# Patient Record
Sex: Female | Born: 1970 | Race: White | Hispanic: No | Marital: Married | Smoking: Never smoker
Health system: Southern US, Community
[De-identification: ages and names within clinical notes are randomized; demographics above are authoritative.]

## PROBLEM LIST (undated history)

## (undated) DIAGNOSIS — I10 Essential (primary) hypertension: Secondary | ICD-10-CM

## (undated) DIAGNOSIS — E119 Type 2 diabetes mellitus without complications: Secondary | ICD-10-CM

## (undated) DIAGNOSIS — E785 Hyperlipidemia, unspecified: Secondary | ICD-10-CM

## (undated) HISTORY — DX: Type 2 diabetes mellitus without complications: E11.9

## (undated) HISTORY — PX: ABDOMINAL HYSTERECTOMY: SHX81

## (undated) HISTORY — DX: Hyperlipidemia, unspecified: E78.5

## (undated) HISTORY — PX: CARDIAC ELECTROPHYSIOLOGY STUDY AND ABLATION: SHX1294

## (undated) HISTORY — PX: OTHER SURGICAL HISTORY: SHX169

## (undated) HISTORY — DX: Essential (primary) hypertension: I10

## (undated) HISTORY — PX: TOTAL ABDOMINAL HYSTERECTOMY: SHX209

---

## 2003-09-26 ENCOUNTER — Other Ambulatory Visit: Payer: Self-pay

## 2004-12-24 ENCOUNTER — Ambulatory Visit: Payer: Self-pay | Admitting: Family Medicine

## 2007-03-08 ENCOUNTER — Other Ambulatory Visit: Payer: Self-pay

## 2007-03-08 ENCOUNTER — Emergency Department: Payer: Self-pay | Admitting: Emergency Medicine

## 2007-05-09 ENCOUNTER — Other Ambulatory Visit: Payer: Self-pay

## 2007-05-09 ENCOUNTER — Emergency Department: Payer: Self-pay | Admitting: Emergency Medicine

## 2007-08-09 ENCOUNTER — Emergency Department: Payer: Self-pay | Admitting: Emergency Medicine

## 2009-05-30 ENCOUNTER — Emergency Department: Payer: Self-pay | Admitting: Emergency Medicine

## 2009-12-06 ENCOUNTER — Emergency Department: Payer: Self-pay | Admitting: Emergency Medicine

## 2012-06-01 ENCOUNTER — Ambulatory Visit: Payer: Self-pay

## 2014-09-11 ENCOUNTER — Other Ambulatory Visit: Payer: Self-pay | Admitting: Unknown Physician Specialty

## 2014-09-12 NOTE — Telephone Encounter (Signed)
Needs an appointment in August

## 2014-09-13 NOTE — Telephone Encounter (Signed)
Called and left patient a voicemail to call back and schedule a follow-up visit.

## 2014-09-14 NOTE — Telephone Encounter (Signed)
Called and left patient a voicemail to return my call and schedule a follow-up visit.

## 2014-09-14 NOTE — Telephone Encounter (Signed)
Patient returned call and was scheduled a follow-up visit in August.

## 2014-10-10 ENCOUNTER — Ambulatory Visit: Payer: Self-pay | Admitting: Unknown Physician Specialty

## 2014-10-24 ENCOUNTER — Encounter: Payer: Self-pay | Admitting: Unknown Physician Specialty

## 2014-10-24 ENCOUNTER — Ambulatory Visit (INDEPENDENT_AMBULATORY_CARE_PROVIDER_SITE_OTHER): Payer: Managed Care, Other (non HMO) | Admitting: Unknown Physician Specialty

## 2014-10-24 VITALS — BP 116/79 | HR 96 | Temp 99.1°F | Ht 63.1 in | Wt 231.2 lb

## 2014-10-24 DIAGNOSIS — E1122 Type 2 diabetes mellitus with diabetic chronic kidney disease: Secondary | ICD-10-CM

## 2014-10-24 DIAGNOSIS — N189 Chronic kidney disease, unspecified: Secondary | ICD-10-CM

## 2014-10-24 DIAGNOSIS — N182 Chronic kidney disease, stage 2 (mild): Secondary | ICD-10-CM | POA: Diagnosis not present

## 2014-10-24 DIAGNOSIS — R1011 Right upper quadrant pain: Secondary | ICD-10-CM | POA: Diagnosis not present

## 2014-10-24 DIAGNOSIS — N183 Chronic kidney disease, stage 3 (moderate): Secondary | ICD-10-CM | POA: Diagnosis not present

## 2014-10-24 DIAGNOSIS — E785 Hyperlipidemia, unspecified: Secondary | ICD-10-CM | POA: Insufficient documentation

## 2014-10-24 DIAGNOSIS — E119 Type 2 diabetes mellitus without complications: Secondary | ICD-10-CM | POA: Insufficient documentation

## 2014-10-24 DIAGNOSIS — N184 Chronic kidney disease, stage 4 (severe): Secondary | ICD-10-CM

## 2014-10-24 DIAGNOSIS — E1169 Type 2 diabetes mellitus with other specified complication: Secondary | ICD-10-CM | POA: Insufficient documentation

## 2014-10-24 DIAGNOSIS — N181 Chronic kidney disease, stage 1: Secondary | ICD-10-CM | POA: Diagnosis not present

## 2014-10-24 DIAGNOSIS — F32A Depression, unspecified: Secondary | ICD-10-CM | POA: Insufficient documentation

## 2014-10-24 DIAGNOSIS — I129 Hypertensive chronic kidney disease with stage 1 through stage 4 chronic kidney disease, or unspecified chronic kidney disease: Secondary | ICD-10-CM | POA: Insufficient documentation

## 2014-10-24 DIAGNOSIS — N185 Chronic kidney disease, stage 5: Secondary | ICD-10-CM

## 2014-10-24 DIAGNOSIS — F329 Major depressive disorder, single episode, unspecified: Secondary | ICD-10-CM

## 2014-10-24 DIAGNOSIS — G2581 Restless legs syndrome: Secondary | ICD-10-CM

## 2014-10-24 DIAGNOSIS — I8390 Asymptomatic varicose veins of unspecified lower extremity: Secondary | ICD-10-CM | POA: Insufficient documentation

## 2014-10-24 LAB — UA/M W/RFLX CULTURE, ROUTINE
BILIRUBIN UA: NEGATIVE
KETONES UA: NEGATIVE
Leukocytes, UA: NEGATIVE
Nitrite, UA: NEGATIVE
Protein, UA: NEGATIVE
RBC, UA: NEGATIVE
SPEC GRAV UA: 1.02 (ref 1.005–1.030)
UUROB: 0.2 mg/dL (ref 0.2–1.0)
pH, UA: 6 (ref 5.0–7.5)

## 2014-10-24 LAB — BAYER DCA HB A1C WAIVED: HB A1C: 7.7 % — AB (ref <7.0)

## 2014-10-24 MED ORDER — ATORVASTATIN CALCIUM 20 MG PO TABS: 20.0000 mg | ORAL_TABLET | Freq: Every day | ORAL | Status: DC

## 2014-10-24 MED ORDER — CANAGLIFLOZIN 100 MG PO TABS: 100.0000 mg | ORAL_TABLET | Freq: Every day | ORAL | Status: DC

## 2014-10-24 MED ORDER — METFORMIN HCL 500 MG PO TABS: 1000.0000 mg | ORAL_TABLET | Freq: Two times a day (BID) | ORAL | Status: DC

## 2014-10-24 MED ORDER — AMLODIPINE BESYLATE 5 MG PO TABS: 5.0000 mg | ORAL_TABLET | Freq: Every day | ORAL | Status: DC

## 2014-10-24 MED ORDER — LOSARTAN POTASSIUM-HCTZ 100-12.5 MG PO TABS: 1.0000 | ORAL_TABLET | Freq: Every day | ORAL | Status: DC

## 2014-10-24 MED ORDER — FLUOXETINE HCL 20 MG PO CAPS: 20.0000 mg | ORAL_CAPSULE | Freq: Every day | ORAL | Status: DC

## 2014-10-24 MED ORDER — ATENOLOL-CHLORTHALIDONE 100-25 MG PO TABS: 1.0000 | ORAL_TABLET | Freq: Every day | ORAL | Status: DC

## 2014-10-24 NOTE — Assessment & Plan Note (Signed)
Hgb A1C is 7.7.  Start Invokanna that she didn't take before.  Let me know if she needs a new prescription

## 2014-10-24 NOTE — Progress Notes (Signed)
BP 116/79 mmHg  Pulse 96  Temp(Src) 99.1 F (37.3 C)  Ht 5' 3.1" (1.603 m)  Wt 231 lb 3.2 oz (104.872 kg)  BMI 40.81 kg/m2  SpO2 97%  LMP  (LMP Unknown)   Subjective:    Patient ID: Holly Norman, female    DOB: Jun 24, 1970, 44 y.o.   MRN: [id number]  HPI: Holly Norman is a 44 y.o. female  Chief Complaint  Patient presents with  . Diabetes  . Hyperlipidemia  . Hypertension    Relevant past medical, surgical, family and social history reviewed and updated as indicated. Interim medical history since our last visit reviewed. Allergies and medications reviewed and updated.  Diabetes She presents for her follow-up (I put her on Invokanna but she didn't take it as the pharmacist had her scared.  ) diabetic visit. She has type 2 diabetes mellitus. No MedicAlert identification noted. Her disease course has been stable. Pertinent negatives for hypoglycemia include no headaches. Associated symptoms include fatigue. Pertinent negatives for diabetes include no blurred vision, no chest pain, no foot paresthesias, no polydipsia and no weakness. There are no hypoglycemic complications. Symptoms are stable. There are no diabetic complications. She is compliant with treatment all of the time. Her weight is stable. She is following a generally healthy diet. She participates in exercise every other day. Eye exam is current.  Hyperlipidemia This is a chronic problem. The problem is controlled. Pertinent negatives include no chest pain. Current antihyperlipidemic treatment includes statins. There are no compliance problems.   Hypertension This is a chronic problem. The problem is controlled. Associated symptoms include malaise/fatigue. Pertinent negatives include no blurred vision, chest pain or headaches. There are no compliance problems.    Back pain Describes right sided mid back pain that comes and goes.  Sometimes radiates to the front.  Feels like a pressure.  Concerned as it feels like it's  over her kidney and has a history of a kidney infection.    Review of Systems  Constitutional: Positive for malaise/fatigue and fatigue.  Eyes: Negative for blurred vision.  Cardiovascular: Negative for chest pain.  Endocrine: Negative for polydipsia.  Neurological: Negative for weakness and headaches.    Per HPI unless specifically indicated above     Objective:    BP 116/79 mmHg  Pulse 96  Temp(Src) 99.1 F (37.3 C)  Ht 5' 3.1" (1.603 m)  Wt 231 lb 3.2 oz (104.872 kg)  BMI 40.81 kg/m2  SpO2 97%  LMP  (LMP Unknown)  Wt Readings from Last 3 Encounters:  10/24/14 231 lb 3.2 oz (104.872 kg)  07/17/14 231 lb (104.781 kg)    Physical Exam  Constitutional: She is oriented to person, place, and time. She appears well-developed and well-nourished. No distress.  HENT:  Head: Normocephalic and atraumatic.  Eyes: Conjunctivae and lids are normal. Right eye exhibits no discharge. Left eye exhibits no discharge. No scleral icterus.  Cardiovascular: Normal rate, regular rhythm and normal heart sounds.   Pulmonary/Chest: Effort normal. No respiratory distress.  Abdominal: Soft. Normal appearance and bowel sounds are normal. She exhibits no distension. There is tenderness in the right upper quadrant. There is CVA tenderness. There is no rebound, no guarding and negative Murphy's sign.  Musculoskeletal: Normal range of motion.  Neurological: She is alert and oriented to person, place, and time.  Skin: Skin is intact. No rash noted. No pallor.  Psychiatric: She has a normal mood and affect. Her behavior is normal. Judgment and thought content normal.  No results found for this or any previous visit.    Assessment & Plan:   Problem List Items Addressed This Visit      Unprioritized   Hypertensive CKD (chronic kidney disease)    Stable.  Continue present medication      Relevant Orders   Comprehensive metabolic panel   Diabetes    Hgb A1C is 7.7.  Start Invokanna that she  didn't take before.  Let me know if she needs a new prescription      Relevant Medications   atenolol-chlorthalidone (TENORETIC) 100-25 MG per tablet   atorvastatin (LIPITOR) 20 MG tablet   losartan-hydrochlorothiazide (HYZAAR) 100-12.5 MG per tablet   metFORMIN (GLUCOPHAGE) 500 MG tablet   canagliflozin (INVOKANA) 100 MG TABS tablet   Other Relevant Orders   Bayer DCA Hb A1c Waived   Comprehensive metabolic panel   CKD (chronic kidney disease) - Primary   Relevant Orders   Comprehensive metabolic panel   UA/M w/rflx Culture, Routine    Other Visit Diagnoses    Abdominal pain, RUQ        Suspect gallbladder.  Check Amylase/Lipase.  Ordered abdominal US    Relevant Orders    Lipase    Amylase    US Abdomen Complete        Follow up plan: Return in about 3 months (around 01/24/2015).

## 2014-10-24 NOTE — Assessment & Plan Note (Signed)
Stable.  Continue present medication 

## 2014-10-25 ENCOUNTER — Encounter: Payer: Self-pay | Admitting: Unknown Physician Specialty

## 2014-10-25 LAB — COMPREHENSIVE METABOLIC PANEL
A/G RATIO: 1.9 (ref 1.1–2.5)
ALK PHOS: 70 IU/L (ref 39–117)
ALT: 31 IU/L (ref 0–32)
AST: 31 IU/L (ref 0–40)
Albumin: 4.3 g/dL (ref 3.5–5.5)
BILIRUBIN TOTAL: 0.8 mg/dL (ref 0.0–1.2)
BUN/Creatinine Ratio: 20 (ref 9–23)
BUN: 16 mg/dL (ref 6–24)
CHLORIDE: 96 mmol/L — AB (ref 97–108)
CO2: 26 mmol/L (ref 18–29)
Calcium: 9.8 mg/dL (ref 8.7–10.2)
Creatinine, Ser: 0.8 mg/dL (ref 0.57–1.00)
GFR calc non Af Amer: 90 mL/min/{1.73_m2} (ref >59)
GFR, EST AFRICAN AMERICAN: 104 mL/min/{1.73_m2} (ref >59)
Globulin, Total: 2.3 g/dL (ref 1.5–4.5)
Glucose: 230 mg/dL — ABNORMAL HIGH (ref 65–99)
POTASSIUM: 4.4 mmol/L (ref 3.5–5.2)
Sodium: 137 mmol/L (ref 134–144)
Total Protein: 6.6 g/dL (ref 6.0–8.5)

## 2014-10-25 LAB — AMYLASE: AMYLASE: 33 U/L (ref 31–124)

## 2014-10-25 LAB — LIPASE: Lipase: 58 U/L (ref 0–59)

## 2014-10-31 ENCOUNTER — Encounter: Payer: Self-pay | Admitting: Unknown Physician Specialty

## 2014-10-31 ENCOUNTER — Ambulatory Visit
Admission: RE | Admit: 2014-10-31 | Discharge: 2014-10-31 | Disposition: A | Discharge status: Home / Self Care | Payer: Managed Care, Other (non HMO) | Source: Ambulatory Visit | Attending: Unknown Physician Specialty | Admitting: Unknown Physician Specialty

## 2014-10-31 DIAGNOSIS — R1011 Right upper quadrant pain: Secondary | ICD-10-CM | POA: Insufficient documentation

## 2015-01-24 ENCOUNTER — Ambulatory Visit: Payer: Managed Care, Other (non HMO) | Admitting: Unknown Physician Specialty

## 2015-05-18 ENCOUNTER — Telehealth: Payer: Self-pay | Admitting: Unknown Physician Specialty

## 2015-05-18 MED ORDER — OSELTAMIVIR PHOSPHATE 75 MG PO CAPS: 75.0000 mg | ORAL_CAPSULE | Freq: Two times a day (BID) | ORAL | Status: DC

## 2015-05-18 NOTE — Telephone Encounter (Signed)
Routing to provider  

## 2015-05-18 NOTE — Telephone Encounter (Signed)
Voice mail box is full, unable to leave message.

## 2015-05-18 NOTE — Telephone Encounter (Signed)
Pt called stated her husband has the flu and she thinks she has gotten now. Wants to know if tamiflu can be sent to the pharmacy for her. Pharm is [facility] [address] in [place]. Thanks.  Please call pt to confirm medication is called in.

## 2015-05-18 NOTE — Telephone Encounter (Signed)
Sent in

## 2015-05-20 ENCOUNTER — Other Ambulatory Visit: Payer: Self-pay | Admitting: Unknown Physician Specialty

## 2015-05-20 NOTE — Telephone Encounter (Signed)
Needs check

## 2015-05-21 NOTE — Telephone Encounter (Signed)
Called and left patient a voicemail asking for her to please return my call.  

## 2015-05-23 NOTE — Telephone Encounter (Signed)
Called and left patient a voicemail asking for her to please return my call.  

## 2015-05-24 ENCOUNTER — Telehealth: Payer: Self-pay

## 2015-05-24 NOTE — Telephone Encounter (Signed)
Called and left patient a voicemail asking for her to please return my call. This is the 3rd attempt at reaching the patient. Will send a letter.

## 2015-05-24 NOTE — Telephone Encounter (Signed)
Patient returned call from previously closed encounter. She did get the flu prescription we sent in for her and she also scheduled a f/u appointment for 06/05/15.

## 2015-05-24 NOTE — Telephone Encounter (Signed)
Called and left patient a voicemail asking for her to please return my call. This was the 4th attempt at reaching the patient so I will send a letter.

## 2015-06-05 ENCOUNTER — Ambulatory Visit: Payer: Self-pay | Admitting: Unknown Physician Specialty

## 2015-06-11 ENCOUNTER — Ambulatory Visit (INDEPENDENT_AMBULATORY_CARE_PROVIDER_SITE_OTHER): Payer: Managed Care, Other (non HMO) | Admitting: Unknown Physician Specialty

## 2015-06-11 ENCOUNTER — Encounter: Payer: Self-pay | Admitting: Unknown Physician Specialty

## 2015-06-11 VITALS — BP 101/71 | HR 91 | Temp 98.3°F | Ht 62.1 in | Wt 226.4 lb

## 2015-06-11 DIAGNOSIS — R5382 Chronic fatigue, unspecified: Secondary | ICD-10-CM

## 2015-06-11 DIAGNOSIS — I10 Essential (primary) hypertension: Secondary | ICD-10-CM | POA: Diagnosis not present

## 2015-06-11 DIAGNOSIS — E785 Hyperlipidemia, unspecified: Secondary | ICD-10-CM

## 2015-06-11 DIAGNOSIS — I129 Hypertensive chronic kidney disease with stage 1 through stage 4 chronic kidney disease, or unspecified chronic kidney disease: Secondary | ICD-10-CM | POA: Diagnosis not present

## 2015-06-11 DIAGNOSIS — Z0000 Encounter for general adult medical examination without abnormal findings: Secondary | ICD-10-CM

## 2015-06-11 DIAGNOSIS — E1122 Type 2 diabetes mellitus with diabetic chronic kidney disease: Secondary | ICD-10-CM | POA: Diagnosis not present

## 2015-06-11 LAB — LIPID PANEL PICCOLO, WAIVED

## 2015-06-11 MED ORDER — CANAGLIFLOZIN 300 MG PO TABS: 300.0000 mg | ORAL_TABLET | Freq: Every day | ORAL | Status: DC

## 2015-06-11 MED ORDER — LOSARTAN POTASSIUM 50 MG PO TABS
50.0000 mg | ORAL_TABLET | Freq: Every day | ORAL | Status: DC
Start: 2015-06-11 — End: 2015-12-17

## 2015-06-11 NOTE — Assessment & Plan Note (Signed)
Sample lipemic.  Send to lab

## 2015-06-11 NOTE — Assessment & Plan Note (Signed)
A little hypotensive.  Change Hyzaar 100/12.5 to Cozaar 50 mg

## 2015-06-11 NOTE — Assessment & Plan Note (Signed)
Hgb A1C is 7.7.  She feels like she can do better on diet and exercise.  Will increase Invokanna to 300 mg.

## 2015-06-11 NOTE — Progress Notes (Signed)
BP 101/71 mmHg  Pulse 91  Temp(Src) 98.3 F (36.8 C)  Ht 5' 2.1" (1.577 m)  Wt 226 lb 6.4 oz (102.694 kg)  BMI 41.29 kg/m2  SpO2 99%  LMP  (LMP Unknown)   Subjective:    Patient ID: Holly Norman, female    DOB: 1970-09-11, 45 y.o.   MRN: [id number]  HPI: Holly Norman is a 45 y.o. female  Chief Complaint  Patient presents with  . Diabetes  . Hyperlipidemia  . Hypertension   Diabetes:  Using medications without difficulties No hypoglycemic episodes No hyperglycemic episodes Feet problems: none Blood Sugars averaging: Not checking  Hypertension:  Using medications without difficulty Average home BPs 90/50 while at work  Using medication without problems or lightheadedness No chest pain with exertion or shortness of breath No Edema  Elevated Cholesterol: Using medications without problems: No Muscle aches:  Diet compliance: "pretty good" Exercise: walking some  States she is "tired" all the time.  Does admit to having a lot of things going on in her life      Relevant past medical, surgical, family and social history reviewed and updated as indicated. Interim medical history since our last visit reviewed. Allergies and medications reviewed and updated.  Review of Systems  Constitutional: Positive for fatigue.  HENT: Negative.   Eyes: Negative.   Respiratory: Negative.   Cardiovascular: Negative.   Gastrointestinal: Negative.   Endocrine: Negative.   Genitourinary: Negative.   Musculoskeletal: Negative.   Skin: Negative.   Allergic/Immunologic: Negative.   Neurological: Negative.   Hematological: Negative.   Psychiatric/Behavioral: Negative.     Per HPI unless specifically indicated above     Objective:    BP 101/71 mmHg  Pulse 91  Temp(Src) 98.3 F (36.8 C)  Ht 5' 2.1" (1.577 m)  Wt 226 lb 6.4 oz (102.694 kg)  BMI 41.29 kg/m2  SpO2 99%  LMP  (LMP Unknown)  Wt Readings from Last 3 Encounters:  06/11/15 226 lb 6.4 oz (102.694 kg)   10/24/14 231 lb 3.2 oz (104.872 kg)  07/17/14 231 lb (104.781 kg)    Physical Exam  Constitutional: She is oriented to person, place, and time. She appears well-developed and well-nourished. No distress.  HENT:  Head: Normocephalic and atraumatic.  Eyes: Conjunctivae and lids are normal. Right eye exhibits no discharge. Left eye exhibits no discharge. No scleral icterus.  Neck: Normal range of motion. Neck supple. No JVD present. Carotid bruit is not present.  Cardiovascular: Normal rate, regular rhythm and normal heart sounds.   Pulmonary/Chest: Effort normal and breath sounds normal.  Abdominal: Normal appearance. There is no splenomegaly or hepatomegaly.  Musculoskeletal: Normal range of motion.  Neurological: She is alert and oriented to person, place, and time.  Skin: Skin is warm, dry and intact. No rash noted. No pallor.  Psychiatric: She has a normal mood and affect. Her behavior is normal. Judgment and thought content normal.    Results for orders placed or performed in visit on 10/24/14  Bayer DCA Hb A1c Waived  Result Value Ref Range   Bayer DCA Hb A1c Waived 7.7 (H) <7.0 %  Comprehensive metabolic panel  Result Value Ref Range   Glucose 230 (H) 65 - 99 mg/dL   BUN 16 6 - 24 mg/dL   Creatinine, Ser 0.80 0.57 - 1.00 mg/dL   GFR calc non Af Amer 90 >59 mL/min/1.73   GFR calc Af Amer 104 >59 mL/min/1.73   BUN/Creatinine Ratio 20 9 - 23  Sodium 137 134 - 144 mmol/L   Potassium 4.4 3.5 - 5.2 mmol/L   Chloride 96 (L) 97 - 108 mmol/L   CO2 26 18 - 29 mmol/L   Calcium 9.8 8.7 - 10.2 mg/dL   Total Protein 6.6 6.0 - 8.5 g/dL   Albumin 4.3 3.5 - 5.5 g/dL   Globulin, Total 2.3 1.5 - 4.5 g/dL   Albumin/Globulin Ratio 1.9 1.1 - 2.5   Bilirubin Total 0.8 0.0 - 1.2 mg/dL   Alkaline Phosphatase 70 39 - 117 IU/L   AST 31 0 - 40 IU/L   ALT 31 0 - 32 IU/L  UA/M w/rflx Culture, Routine  Result Value Ref Range   Specific Gravity, UA 1.020 1.005 - 1.030   pH, UA 6.0 5.0 - 7.5    Color, UA Yellow Yellow   Appearance Ur Clear Clear   Leukocytes, UA Negative Negative   Protein, UA Negative Negative/Trace   Glucose, UA 3+ (A) Negative   Ketones, UA Negative Negative   RBC, UA Negative Negative   Bilirubin, UA Negative Negative   Urobilinogen, Ur 0.2 0.2 - 1.0 mg/dL   Nitrite, UA Negative Negative  Lipase  Result Value Ref Range   Lipase 58 0 - 59 U/L  Amylase  Result Value Ref Range   Amylase 33 31 - 124 U/L      Assessment & Plan:   Problem List Items Addressed This Visit      Unprioritized   Diabetes ([place]) - Primary    Hgb A1C is 7.7.  She feels like she can do better on diet and exercise.  Will increase Invokanna to 300 mg.        Relevant Medications   canagliflozin (INVOKANA) 300 MG TABS tablet   losartan (COZAAR) 50 MG tablet   Other Relevant Orders   Bayer DCA Hb A1c Waived   Hyperlipidemia    Sample lipemic.  Send to lab      Relevant Medications   losartan (COZAAR) 50 MG tablet   Other Relevant Orders   Lipid Panel [place], Waived   Hypertensive CKD (chronic kidney disease)    A little hypotensive.  Change Hyzaar 100/12.5 to Cozaar 50 mg       Other Visit Diagnoses    Essential hypertension, benign        Relevant Medications    losartan (COZAAR) 50 MG tablet    Other Relevant Orders    Microalbumin, Urine Waived    Uric acid    Comprehensive metabolic panel    Routine general medical examination at a health care facility        Relevant Orders    HIV antibody    Chronic fatigue        Relevant Orders    CBC with Differential/Platelet    TSH    VITAMIN D 25 Hydroxy (Vit-D Deficiency, Fractures)        Follow up plan: Return in about 3 months (around 09/10/2015).

## 2015-06-12 ENCOUNTER — Encounter: Payer: Self-pay | Admitting: Unknown Physician Specialty

## 2015-06-12 LAB — CBC WITH DIFFERENTIAL/PLATELET
BASOS: 0 %
Basophils Absolute: 0 10*3/uL (ref 0.0–0.2)
EOS (ABSOLUTE): 0.3 10*3/uL (ref 0.0–0.4)
EOS: 4 %
HEMATOCRIT: 35 % (ref 34.0–46.6)
HEMOGLOBIN: 12.1 g/dL (ref 11.1–15.9)
IMMATURE GRANS (ABS): 0 10*3/uL (ref 0.0–0.1)
IMMATURE GRANULOCYTES: 0 %
LYMPHS: 26 %
Lymphocytes Absolute: 2.1 10*3/uL (ref 0.7–3.1)
MCH: 31.8 pg (ref 26.6–33.0)
MCHC: 34.6 g/dL (ref 31.5–35.7)
MCV: 92 fL (ref 79–97)
MONOCYTES: 7 %
Monocytes Absolute: 0.5 10*3/uL (ref 0.1–0.9)
NEUTROS PCT: 63 %
Neutrophils Absolute: 5 10*3/uL (ref 1.4–7.0)
Platelets: 280 10*3/uL (ref 150–379)
RBC: 3.81 x10E6/uL (ref 3.77–5.28)
RDW: 14.1 % (ref 12.3–15.4)
WBC: 8 10*3/uL (ref 3.4–10.8)

## 2015-06-12 LAB — COMPREHENSIVE METABOLIC PANEL
ALK PHOS: 85 IU/L (ref 39–117)
ALT: 26 IU/L (ref 0–32)
AST: 25 IU/L (ref 0–40)
Albumin/Globulin Ratio: 1.6 (ref 1.2–2.2)
Albumin: 4.1 g/dL (ref 3.5–5.5)
BUN/Creatinine Ratio: 17 (ref 9–23)
BUN: 14 mg/dL (ref 6–24)
Bilirubin Total: 0.7 mg/dL (ref 0.0–1.2)
CALCIUM: 9.3 mg/dL (ref 8.7–10.2)
CO2: 23 mmol/L (ref 18–29)
CREATININE: 0.84 mg/dL (ref 0.57–1.00)
Chloride: 98 mmol/L (ref 96–106)
GFR calc Af Amer: 97 mL/min/{1.73_m2} (ref >59)
GFR, EST NON AFRICAN AMERICAN: 84 mL/min/{1.73_m2} (ref >59)
GLOBULIN, TOTAL: 2.6 g/dL (ref 1.5–4.5)
GLUCOSE: 194 mg/dL — AB (ref 65–99)
Potassium: 3.6 mmol/L (ref 3.5–5.2)
SODIUM: 141 mmol/L (ref 134–144)
Total Protein: 6.7 g/dL (ref 6.0–8.5)

## 2015-06-12 LAB — MICROALBUMIN, URINE WAIVED
Creatinine, Urine Waived: 50 mg/dL (ref 10–300)
Microalb, Ur Waived: 10 mg/L (ref 0–19)
Microalb/Creat Ratio: <30 mg/g (ref <30)

## 2015-06-12 LAB — LIPID PANEL W/O CHOL/HDL RATIO
CHOLESTEROL TOTAL: 214 mg/dL — AB (ref 100–199)
HDL: 29 mg/dL — ABNORMAL LOW (ref >39)
TRIGLYCERIDES: 549 mg/dL — AB (ref 0–149)

## 2015-06-12 LAB — VITAMIN D 25 HYDROXY (VIT D DEFICIENCY, FRACTURES): VIT D 25 HYDROXY: 11.5 ng/mL — AB (ref 30.0–100.0)

## 2015-06-12 LAB — SPECIMEN STATUS REPORT

## 2015-06-12 LAB — BAYER DCA HB A1C WAIVED: HB A1C (BAYER DCA - WAIVED): 7.7 % — ABNORMAL HIGH (ref <7.0)

## 2015-06-12 LAB — HIV ANTIBODY (ROUTINE TESTING W REFLEX): HIV SCREEN 4TH GENERATION: NONREACTIVE

## 2015-06-12 LAB — URIC ACID: Uric Acid: 6.4 mg/dL (ref 2.5–7.1)

## 2015-06-12 LAB — TSH: TSH: 1.04 u[IU]/mL (ref 0.450–4.500)

## 2015-06-12 NOTE — Progress Notes (Signed)
Quick Note:  Low Vitamin D. Take 5,000 units daily of OTC Vitamin D. Patient notified by letter. ______

## 2015-07-18 ENCOUNTER — Other Ambulatory Visit: Payer: Self-pay | Admitting: Unknown Physician Specialty

## 2015-09-07 ENCOUNTER — Other Ambulatory Visit: Payer: Self-pay | Admitting: Unknown Physician Specialty

## 2015-09-10 ENCOUNTER — Ambulatory Visit: Payer: Managed Care, Other (non HMO) | Admitting: Unknown Physician Specialty

## 2015-09-18 ENCOUNTER — Other Ambulatory Visit: Payer: Self-pay | Admitting: Unknown Physician Specialty

## 2015-09-19 ENCOUNTER — Telehealth: Payer: Self-pay

## 2015-09-19 NOTE — Telephone Encounter (Signed)
Called pharmacy and let them know that Malachy Mood gave the OK for the medications to be given separate. Will call patient and let her know.

## 2015-09-19 NOTE — Telephone Encounter (Signed)
Called and left patient a voicemail asking for her to please return my call.  

## 2015-09-19 NOTE — Telephone Encounter (Signed)
Pharmacy sent a fax stating that the medication atenolol/chlorthalidone is on back order. They state that they have the ingredients separately so they want to know if we can split the 2 medications or if we want to substitute another medication. Pharmacy is [facility].

## 2015-09-19 NOTE — Telephone Encounter (Signed)
They can be given separatey

## 2015-09-20 NOTE — Telephone Encounter (Signed)
Called and left patient a voicemail asking for her to please return my call.  

## 2015-09-21 NOTE — Telephone Encounter (Signed)
Called and left patient a voicemail about medication change.

## 2015-10-15 ENCOUNTER — Other Ambulatory Visit: Payer: Self-pay | Admitting: Unknown Physician Specialty

## 2015-10-15 NOTE — Telephone Encounter (Signed)
I have that she's on a combo drug- can you find out if she's actually taking this one?

## 2015-11-09 ENCOUNTER — Other Ambulatory Visit: Payer: Self-pay | Admitting: Unknown Physician Specialty

## 2015-12-17 ENCOUNTER — Other Ambulatory Visit: Payer: Self-pay | Admitting: Family Medicine

## 2015-12-17 ENCOUNTER — Other Ambulatory Visit: Payer: Self-pay | Admitting: Unknown Physician Specialty

## 2015-12-18 NOTE — Telephone Encounter (Signed)
Routing to provider  

## 2015-12-27 ENCOUNTER — Telehealth: Payer: Self-pay

## 2015-12-27 NOTE — Telephone Encounter (Signed)
Losartan 50 mg Take 1 tablet daily.

## 2015-12-28 NOTE — Telephone Encounter (Signed)
Your patient 

## 2016-01-10 ENCOUNTER — Ambulatory Visit (INDEPENDENT_AMBULATORY_CARE_PROVIDER_SITE_OTHER): Payer: Managed Care, Other (non HMO) | Admitting: Family Medicine

## 2016-01-10 ENCOUNTER — Encounter: Payer: Self-pay | Admitting: Family Medicine

## 2016-01-10 ENCOUNTER — Telehealth: Payer: Self-pay | Admitting: Unknown Physician Specialty

## 2016-01-10 VITALS — BP 108/75 | HR 83 | Temp 98.7°F | Wt 225.0 lb

## 2016-01-10 DIAGNOSIS — E1122 Type 2 diabetes mellitus with diabetic chronic kidney disease: Secondary | ICD-10-CM | POA: Diagnosis not present

## 2016-01-10 DIAGNOSIS — E782 Mixed hyperlipidemia: Secondary | ICD-10-CM

## 2016-01-10 DIAGNOSIS — I129 Hypertensive chronic kidney disease with stage 1 through stage 4 chronic kidney disease, or unspecified chronic kidney disease: Secondary | ICD-10-CM | POA: Diagnosis not present

## 2016-01-10 DIAGNOSIS — F330 Major depressive disorder, recurrent, mild: Secondary | ICD-10-CM

## 2016-01-10 MED ORDER — ATENOLOL-CHLORTHALIDONE 100-25 MG PO TABS: 1.0000 | ORAL_TABLET | Freq: Every day | ORAL | 6 refills | Status: DC

## 2016-01-10 MED ORDER — CANAGLIFLOZIN 300 MG PO TABS: 300.0000 mg | ORAL_TABLET | Freq: Every day | ORAL | 3 refills | Status: DC

## 2016-01-10 MED ORDER — ATORVASTATIN CALCIUM 20 MG PO TABS: 20.0000 mg | ORAL_TABLET | Freq: Every day | ORAL | 6 refills | Status: DC

## 2016-01-10 MED ORDER — VENLAFAXINE HCL 100 MG PO TABS: 100.0000 mg | ORAL_TABLET | Freq: Two times a day (BID) | ORAL | 0 refills | Status: DC

## 2016-01-10 MED ORDER — METFORMIN HCL 500 MG PO TABS: 1000.0000 mg | ORAL_TABLET | Freq: Two times a day (BID) | ORAL | 6 refills | Status: DC

## 2016-01-10 MED ORDER — AMLODIPINE BESYLATE 5 MG PO TABS: 5.0000 mg | ORAL_TABLET | Freq: Every day | ORAL | 6 refills | Status: DC

## 2016-01-10 MED ORDER — LOSARTAN POTASSIUM 50 MG PO TABS: 50.0000 mg | ORAL_TABLET | Freq: Every day | ORAL | 6 refills | Status: DC

## 2016-01-10 NOTE — Progress Notes (Signed)
BP 108/75   Pulse 83   Temp 98.7 F (37.1 C)   Wt 225 lb (102.1 kg)   LMP  (LMP Unknown)   SpO2 94%   BMI 41.02 kg/m    Subjective:    Patient ID: Holly Norman, female    DOB: 04-Sep-1970, 45 y.o.   MRN: [id number]  HPI: Holly Norman is a 45 y.o. female  Chief Complaint  Patient presents with  . Medication change    just doesn't feel like herself anymore. Crying alot. Doesn't feel depressed, but just different. Has been on the prozac for a while and wonders if it needs to be changed.   Patient presents to discuss her worsening depression symptoms lately. Does have a lot of family and financial stress right now, and also feels like her prozac isn't working for her anymore. Has been on it a very long time and feels like maybe her body is used to it now. Wants to discuss other options. Wonders if some of her changing symptoms are hormone related as she is also starting to notice hot flashes, headaches, and night sweats.   She also notes that in the next week or so she will be due for medication renewals on her chronic medications and wants to see about doing all of this while she is here. She is not fasting today for labs. Doing well with all of her medications. Not checking BPs at home, but they are always good at appts. Taking all medications faithfully without side effects. Not checking BS's at home. Walking daily, trying to be careful with diet but still finds herself having a fair amount of pasta and potatoes. Does well cutting out sweets though. Denies CP, SOB, N/V/D.   Relevant past medical, surgical, family and social history reviewed and updated as indicated. Interim medical history since our last visit reviewed. Allergies and medications reviewed and updated.  Review of Systems  Constitutional: Negative.   HENT: Negative.   Respiratory: Negative.   Cardiovascular: Negative.   Gastrointestinal: Negative.   Genitourinary: Negative.   Musculoskeletal: Negative.   Skin:  Negative.   Neurological: Negative.   Psychiatric/Behavioral: Positive for dysphoric mood (with crying spells).    Per HPI unless specifically indicated above     Objective:    BP 108/75   Pulse 83   Temp 98.7 F (37.1 C)   Wt 225 lb (102.1 kg)   LMP  (LMP Unknown)   SpO2 94%   BMI 41.02 kg/m   Wt Readings from Last 3 Encounters:  01/10/16 225 lb (102.1 kg)  06/11/15 226 lb 6.4 oz (102.7 kg)  10/24/14 231 lb 3.2 oz (104.9 kg)    Physical Exam  Constitutional: She is oriented to person, place, and time. She appears well-developed and well-nourished. No distress.  HENT:  Head: Atraumatic.  Eyes: Conjunctivae are normal. No scleral icterus.  Neck: Normal range of motion. Neck supple.  Cardiovascular: Normal rate, regular rhythm and normal heart sounds.   Pulmonary/Chest: Effort normal and breath sounds normal. No respiratory distress.  Musculoskeletal: Normal range of motion. She exhibits no edema or tenderness.  Lymphadenopathy:    She has no cervical adenopathy.  Neurological: She is alert and oriented to person, place, and time.  Skin: Skin is warm and dry.  Psychiatric: She has a normal mood and affect. Her behavior is normal.  Nursing note and vitals reviewed.     Assessment & Plan:   Problem List Items Addressed This Visit  Endocrine   Diabetes ([place])    Await A1c. Discussed better dietary control and to continue good aerobic exercise. Continue current regimen      Relevant Medications   atenolol-chlorthalidone (TENORETIC) 100-25 MG tablet   losartan (COZAAR) 50 MG tablet   atorvastatin (LIPITOR) 20 MG tablet   canagliflozin (INVOKANA) 300 MG TABS tablet   metFORMIN (GLUCOPHAGE) 500 MG tablet   Other Relevant Orders   CBC with Differential/Platelet     Genitourinary   Hypertensive CKD (chronic kidney disease)    BP under good control, continue current regimen. Await CMP      Relevant Orders   CBC with Differential/Platelet   Comprehensive  metabolic panel     Other   Hyperlipidemia    Await non-fasting lipid panel, continue current regimen      Relevant Medications   amLODipine (NORVASC) 5 MG tablet   atenolol-chlorthalidone (TENORETIC) 100-25 MG tablet   losartan (COZAAR) 50 MG tablet   atorvastatin (LIPITOR) 20 MG tablet   Other Relevant Orders   Lipid Panel w/o Chol/HDL Ratio   Depression - Primary    D/c prozac, will start effexor in hopes that it will work well for both her depression as well as vasomotor sxs. Follow up in 1 month. Take 1 tablet daily x 1 week then titrate up to BID.       Relevant Medications   venlafaxine (EFFEXOR) 100 MG tablet    Other Visit Diagnoses   None.      Follow up plan: Return in about 4 weeks (around 02/07/2016) for Depression.

## 2016-01-10 NOTE — Patient Instructions (Signed)
Follow up in about 4 weeks to discuss how your effexor is going

## 2016-01-10 NOTE — Assessment & Plan Note (Signed)
Await A1c. Discussed better dietary control and to continue good aerobic exercise. Continue current regimen

## 2016-01-10 NOTE — Assessment & Plan Note (Signed)
BP under good control, continue current regimen. Await CMP

## 2016-01-10 NOTE — Assessment & Plan Note (Signed)
Await non-fasting lipid panel, continue current regimen

## 2016-01-10 NOTE — Telephone Encounter (Signed)
Pt called stating that she had been crying for 2 days and would like to talk to someone about changing her medication dosage that she just hasn't been herself the past few days.

## 2016-01-10 NOTE — Assessment & Plan Note (Signed)
D/c prozac, will start effexor in hopes that it will work well for both her depression as well as vasomotor sxs. Follow up in 1 month. Take 1 tablet daily x 1 week then titrate up to BID.

## 2016-01-11 ENCOUNTER — Telehealth: Payer: Self-pay | Admitting: Family Medicine

## 2016-01-11 LAB — COMPREHENSIVE METABOLIC PANEL
A/G RATIO: 1.7 (ref 1.2–2.2)
ALK PHOS: 82 IU/L (ref 39–117)
ALT: 41 IU/L — AB (ref 0–32)
AST: 37 IU/L (ref 0–40)
Albumin: 4.5 g/dL (ref 3.5–5.5)
BILIRUBIN TOTAL: 1.1 mg/dL (ref 0.0–1.2)
BUN/Creatinine Ratio: 24 — ABNORMAL HIGH (ref 9–23)
BUN: 22 mg/dL (ref 6–24)
CALCIUM: 10.1 mg/dL (ref 8.7–10.2)
CHLORIDE: 96 mmol/L (ref 96–106)
CO2: 23 mmol/L (ref 18–29)
Creatinine, Ser: 0.9 mg/dL (ref 0.57–1.00)
GFR calc Af Amer: 89 mL/min/{1.73_m2} (ref >59)
GFR calc non Af Amer: 77 mL/min/{1.73_m2} (ref >59)
GLOBULIN, TOTAL: 2.7 g/dL (ref 1.5–4.5)
Glucose: 209 mg/dL — ABNORMAL HIGH (ref 65–99)
POTASSIUM: 3.7 mmol/L (ref 3.5–5.2)
SODIUM: 141 mmol/L (ref 134–144)
Total Protein: 7.2 g/dL (ref 6.0–8.5)

## 2016-01-11 LAB — CBC WITH DIFFERENTIAL/PLATELET
BASOS: 0 %
Basophils Absolute: 0 10*3/uL (ref 0.0–0.2)
EOS (ABSOLUTE): 0.2 10*3/uL (ref 0.0–0.4)
Eos: 3 %
Hematocrit: 39.4 % (ref 34.0–46.6)
Hemoglobin: 14 g/dL (ref 11.1–15.9)
IMMATURE GRANULOCYTES: 0 %
Immature Grans (Abs): 0 10*3/uL (ref 0.0–0.1)
LYMPHS ABS: 2.6 10*3/uL (ref 0.7–3.1)
Lymphs: 32 %
MCH: 32.6 pg (ref 26.6–33.0)
MCHC: 35.5 g/dL (ref 31.5–35.7)
MCV: 92 fL (ref 79–97)
MONOS ABS: 0.5 10*3/uL (ref 0.1–0.9)
Monocytes: 6 %
NEUTROS PCT: 59 %
Neutrophils Absolute: 4.9 10*3/uL (ref 1.4–7.0)
PLATELETS: 314 10*3/uL (ref 150–379)
RBC: 4.3 x10E6/uL (ref 3.77–5.28)
RDW: 14.1 % (ref 12.3–15.4)
WBC: 8.2 10*3/uL (ref 3.4–10.8)

## 2016-01-11 LAB — LIPID PANEL W/O CHOL/HDL RATIO
CHOLESTEROL TOTAL: 235 mg/dL — AB (ref 100–199)
HDL: 35 mg/dL — ABNORMAL LOW (ref >39)
Triglycerides: 692 mg/dL (ref 0–149)

## 2016-01-12 LAB — SPECIMEN STATUS REPORT

## 2016-01-12 LAB — HGB A1C W/O EAG: Hgb A1c MFr Bld: 7.9 % — ABNORMAL HIGH (ref 4.8–5.6)

## 2016-01-14 ENCOUNTER — Telehealth: Payer: Self-pay | Admitting: Family Medicine

## 2016-01-14 NOTE — Telephone Encounter (Signed)
Please call pt and let her know that her A1C and cholesterol have both gotten slightly worse since April when last drawn. Don't need to change meds at this point, will see if she can work on diet and exercise to improve them for now but will likely need to re-evaluate medications at next check if no improvement is seen. Otherwise, labs were all stable

## 2016-01-14 NOTE — Telephone Encounter (Signed)
Patient notified

## 2016-01-15 NOTE — Telephone Encounter (Signed)
error 

## 2016-02-01 ENCOUNTER — Other Ambulatory Visit: Payer: Self-pay | Admitting: Family Medicine

## 2016-02-01 ENCOUNTER — Other Ambulatory Visit: Payer: Self-pay | Admitting: Unknown Physician Specialty

## 2016-02-01 NOTE — Telephone Encounter (Signed)
Your patient 

## 2016-02-07 ENCOUNTER — Ambulatory Visit: Payer: Managed Care, Other (non HMO) | Admitting: Family Medicine

## 2016-02-19 ENCOUNTER — Ambulatory Visit (INDEPENDENT_AMBULATORY_CARE_PROVIDER_SITE_OTHER): Payer: Managed Care, Other (non HMO) | Admitting: Family Medicine

## 2016-02-19 ENCOUNTER — Encounter: Payer: Self-pay | Admitting: Family Medicine

## 2016-02-19 VITALS — BP 120/84 | HR 92 | Temp 98.6°F | Wt 221.0 lb

## 2016-02-19 DIAGNOSIS — F330 Major depressive disorder, recurrent, mild: Secondary | ICD-10-CM

## 2016-02-19 MED ORDER — VENLAFAXINE HCL 100 MG PO TABS: 100.0000 mg | ORAL_TABLET | Freq: Two times a day (BID) | ORAL | 3 refills | Status: DC

## 2016-02-19 NOTE — Assessment & Plan Note (Signed)
Much improved with Effexor, continue current regimen.

## 2016-02-19 NOTE — Progress Notes (Signed)
BP 120/84   Pulse 92   Temp 98.6 F (37 C)   Wt 221 lb (100.2 kg)   LMP  (LMP Unknown)   SpO2 97%   BMI 40.29 kg/m    Subjective:    Patient ID: Holly Norman, female    DOB: 1970/09/10, 45 y.o.   MRN: [id number]  HPI: Holly Norman is a 45 y.o. female  Chief Complaint  Patient presents with  . Depression   Patient presents for depression follow up. Was switched from prozac to effexor last month as she felt like the prozac was no longer working for her. She was having frequent crying spells, and felt down and hopeless the majority of the time. Never had SI/HI, but just felt like she wasn't herself anymore. Doing extremely well on the effexor and reports no crying spells or hopelessness recently. States that her increased quality of moods is also carrying over into how well she has been taking care of herself and she notes that she has had dietary and exercise improvements as well and is down almost 5 lb. Denies side effects from the medication. Does still note some hot flash/vasomotor sxs, but states they are now tolerable.   Relevant past medical, surgical, family and social history reviewed and updated as indicated. Interim medical history since our last visit reviewed. Allergies and medications reviewed and updated.  Review of Systems  Constitutional: Positive for diaphoresis.  HENT: Negative.   Respiratory: Negative.   Cardiovascular: Negative.   Gastrointestinal: Negative.   Genitourinary: Negative.   Musculoskeletal: Negative.   Skin: Negative.   Neurological: Negative.   Psychiatric/Behavioral: Negative.     Per HPI unless specifically indicated above  Depression screen [facility] 2/9 02/19/2016 01/10/2016  Decreased Interest 0 0  Down, Depressed, Hopeless 0 3  PHQ - 2 Score 0 3  Altered sleeping - 3  Tired, decreased energy - 3  Change in appetite - 0  Feeling bad or failure about yourself  - 0  Trouble concentrating - 3  Moving slowly or fidgety/restless - 0    Suicidal thoughts - 0  PHQ-9 Score - 12  Difficult doing work/chores - Somewhat difficult       Objective:    BP 120/84   Pulse 92   Temp 98.6 F (37 C)   Wt 221 lb (100.2 kg)   LMP  (LMP Unknown)   SpO2 97%   BMI 40.29 kg/m   Wt Readings from Last 3 Encounters:  02/19/16 221 lb (100.2 kg)  01/10/16 225 lb (102.1 kg)  06/11/15 226 lb 6.4 oz (102.7 kg)    Physical Exam  Constitutional: She is oriented to person, place, and time. She appears well-developed and well-nourished. No distress.  Eyes: Conjunctivae are normal. Pupils are equal, round, and reactive to light.  Neck: Normal range of motion. Neck supple.  Cardiovascular: Normal rate and normal heart sounds.   Pulmonary/Chest: Effort normal and breath sounds normal. No respiratory distress.  Musculoskeletal: Normal range of motion.  Neurological: She is alert and oriented to person, place, and time.  Skin: Skin is warm and dry.  Psychiatric: She has a normal mood and affect. Her behavior is normal. Judgment and thought content normal.  Nursing note and vitals reviewed.     Assessment & Plan:   Problem List Items Addressed This Visit      Other   Depression - Primary    Much improved with Effexor, continue current regimen.  Relevant Medications   venlafaxine (EFFEXOR) 100 MG tablet       Follow up plan: Return in about 2 months (around 04/21/2016) for Depression f/u, A1C.

## 2016-02-19 NOTE — Patient Instructions (Signed)
Follow up in 2 months to check on your diabetes

## 2016-03-04 ENCOUNTER — Ambulatory Visit (INDEPENDENT_AMBULATORY_CARE_PROVIDER_SITE_OTHER): Payer: Managed Care, Other (non HMO) | Admitting: Family Medicine

## 2016-03-04 ENCOUNTER — Encounter: Payer: Self-pay | Admitting: Family Medicine

## 2016-03-04 VITALS — BP 133/86 | HR 115 | Temp 98.5°F | Wt 223.0 lb

## 2016-03-04 DIAGNOSIS — S161XXA Strain of muscle, fascia and tendon at neck level, initial encounter: Secondary | ICD-10-CM | POA: Diagnosis not present

## 2016-03-04 DIAGNOSIS — R319 Hematuria, unspecified: Secondary | ICD-10-CM

## 2016-03-04 LAB — UA/M W/RFLX CULTURE, ROUTINE
BILIRUBIN UA: NEGATIVE
KETONES UA: NEGATIVE
Leukocytes, UA: NEGATIVE
NITRITE UA: NEGATIVE
PROTEIN UA: NEGATIVE
SPEC GRAV UA: 1.01 (ref 1.005–1.030)
UUROB: 0.2 mg/dL (ref 0.2–1.0)
pH, UA: 5 (ref 5.0–7.5)

## 2016-03-04 LAB — MICROSCOPIC EXAMINATION: Epithelial Cells (non renal): NONE SEEN /hpf (ref 0–10)

## 2016-03-04 MED ORDER — CYCLOBENZAPRINE HCL 10 MG PO TABS: 10.0000 mg | ORAL_TABLET | Freq: Three times a day (TID) | ORAL | 0 refills | Status: DC | PRN

## 2016-03-04 MED ORDER — SULFAMETHOXAZOLE-TRIMETHOPRIM 800-160 MG PO TABS: 1.0000 | ORAL_TABLET | Freq: Two times a day (BID) | ORAL | 0 refills | Status: DC

## 2016-03-04 NOTE — Patient Instructions (Signed)
Follow up as needed

## 2016-03-04 NOTE — Progress Notes (Signed)
BP 133/86   Pulse (!) 115   Temp 98.5 F (36.9 C)   Wt 223 lb (101.2 kg)   LMP  (LMP Unknown)   SpO2 98%   BMI 40.66 kg/m    Subjective:    Patient ID: Holly Norman, female    DOB: [birth year], 45 y.o.   MRN: [id number]  HPI: Holly Norman is a 45 y.o. female  Chief Complaint  Patient presents with  . Urinary Tract Infection    x 2-3 days, worse today. Pressure with urination, some frequency,  blood on toilet paper.   . Neck Twitch    left side of neck having a twitch for the last several days   Patient presents with 2-3 days of pelvic pressure, urinary frequency, and this morning noted blood when wiping after urinating. Denies fever, chills, back pain, N/V/D. States this is what her UTIs feel like so wanted to get in to be seen as soon as possible. Has not taken anything OTC.  Also c/o neck stiffness and pain with ROM on right side, and a new "twitch" on the left side that she's just noted over the last few days. States she feels like maybe her pillows are causing the discomfort. Got a heated neck pillow for christmas that has helped some. No numbness or tingling down arms, HA, or spinal tenderness.   Past Medical History:  Diagnosis Date  . Diabetes mellitus without complication ([place])   . Hyperlipidemia   . Hypertension    Social History   Social History  . Marital status: Married    Spouse name: N/A  . Number of children: N/A  . Years of education: N/A   Occupational History  . Not on file.   Social History Main Topics  . Smoking status: Never Smoker  . Smokeless tobacco: Never Used  . Alcohol use No  . Drug use: No  . Sexual activity: Yes   Other Topics Concern  . Not on file   Social History Narrative  . No narrative on file    Relevant past medical, surgical, family and social history reviewed and updated as indicated. Interim medical history since our last visit reviewed. Allergies and medications reviewed and updated.  Review of Systems    Constitutional: Negative.   HENT: Negative.   Eyes: Negative.   Respiratory: Negative.   Cardiovascular: Negative.   Gastrointestinal: Negative.   Genitourinary: Positive for frequency, hematuria and pelvic pain.  Musculoskeletal: Positive for neck pain.  Neurological: Negative.   Psychiatric/Behavioral: Negative.     Per HPI unless specifically indicated above     Objective:    BP 133/86   Pulse (!) 115   Temp 98.5 F (36.9 C)   Wt 223 lb (101.2 kg)   LMP  (LMP Unknown)   SpO2 98%   BMI 40.66 kg/m   Wt Readings from Last 3 Encounters:  03/04/16 223 lb (101.2 kg)  02/19/16 221 lb (100.2 kg)  01/10/16 225 lb (102.1 kg)    Physical Exam  Constitutional: She is oriented to person, place, and time. She appears well-developed and well-nourished.  HENT:  Head: Atraumatic.  Eyes: Conjunctivae are normal. Pupils are equal, round, and reactive to light.  Neck: Normal range of motion. Neck supple.  Left trapezius ttp Some resistance with left lateral rotation of head  Cardiovascular: Normal rate and normal heart sounds.   Pulmonary/Chest: Effort normal and breath sounds normal. No respiratory distress.  Abdominal: Soft. There is no tenderness.  Musculoskeletal: Normal range of motion.  Lymphadenopathy:    She has no cervical adenopathy.  Neurological: She is alert and oriented to person, place, and time.  Skin: Skin is warm and dry.  Psychiatric: She has a normal mood and affect. Her behavior is normal.  Nursing note and vitals reviewed.     Assessment & Plan:   Problem List Items Addressed This Visit    None    Visit Diagnoses    Hematuria, unspecified type    -  Primary   Some WBCs on microscopic exam, and given how symptomatic she is will treat with bactrim. Follow up if no improvement   Relevant Orders   UA/M w/rflx Culture, Routine (STAT)   Neck strain, initial encounter       Continue heat, stretches, massage, flexeril and ibuprofen prn for relief. Try new  pillows       Follow up plan: Return if symptoms worsen or fail to improve.

## 2016-03-11 ENCOUNTER — Ambulatory Visit (INDEPENDENT_AMBULATORY_CARE_PROVIDER_SITE_OTHER): Payer: Managed Care, Other (non HMO) | Admitting: Family Medicine

## 2016-03-11 VITALS — BP 144/98 | HR 80 | Temp 98.2°F | Ht 62.0 in | Wt 225.0 lb

## 2016-03-11 DIAGNOSIS — J069 Acute upper respiratory infection, unspecified: Secondary | ICD-10-CM

## 2016-03-11 MED ORDER — HYDROCOD POLST-CPM POLST ER 10-8 MG/5ML PO SUER: 5.0000 mL | Freq: Two times a day (BID) | ORAL | 0 refills | Status: DC | PRN

## 2016-03-11 MED ORDER — AZITHROMYCIN 250 MG PO TABS: ORAL_TABLET | ORAL | 0 refills | Status: DC

## 2016-03-11 MED ORDER — BENZONATATE 100 MG PO CAPS: 200.0000 mg | ORAL_CAPSULE | Freq: Three times a day (TID) | ORAL | 0 refills | Status: DC | PRN

## 2016-03-11 NOTE — Progress Notes (Signed)
   BP (!) 144/98   Pulse 80   Temp 98.2 F (36.8 C) (Oral)   Ht 5\' 2"  (1.575 m)   Wt 225 lb (102.1 kg)   LMP  (LMP Unknown)   SpO2 99%   BMI 41.15 kg/m    Subjective:    Patient ID: Holly Norman, female    DOB: Jul 24, 1970, 46 y.o.   MRN: [id number]  HPI: Holly Norman is a 46 y.o. female  Chief Complaint  Patient presents with  . Acute Visit  . Cough    Wet productive with streaks of blood.   . Ear Pain    Right. x 2 days   Patient presents with 1 week history of congestion, productive cough with bloody sputum, and left ear pain that started about 2 days ago. Feels like she's been hot then cold, but unsure about fever. Taking Coricidin HBP since Sunday, cough drops. No sick contacts.   Relevant past medical, surgical, family and social history reviewed and updated as indicated. Interim medical history since our last visit reviewed. Allergies and medications reviewed and updated.  Review of Systems  Constitutional: Negative.   HENT: Positive for congestion and ear pain.   Eyes: Negative.   Respiratory: Positive for cough.   Cardiovascular: Negative.   Gastrointestinal: Negative.   Genitourinary: Negative.   Musculoskeletal: Negative.   Skin: Negative.   Neurological: Negative.   Psychiatric/Behavioral: Negative.     Per HPI unless specifically indicated above     Objective:    BP (!) 144/98   Pulse 80   Temp 98.2 F (36.8 C) (Oral)   Ht 5\' 2"  (1.575 m)   Wt 225 lb (102.1 kg)   LMP  (LMP Unknown)   SpO2 99%   BMI 41.15 kg/m   Wt Readings from Last 3 Encounters:  03/11/16 225 lb (102.1 kg)  03/04/16 223 lb (101.2 kg)  02/19/16 221 lb (100.2 kg)    Physical Exam  Constitutional: She is oriented to person, place, and time. She appears well-developed and well-nourished.  HENT:  Head: Atraumatic.  Right Ear: External ear normal.  Left Ear: External ear normal.  Nose: Nose normal.  Mouth/Throat: No oropharyngeal exudate.  Oropharynx erythematous    Eyes: Conjunctivae are normal. Pupils are equal, round, and reactive to light.  Neck: Normal range of motion. Neck supple.  Cardiovascular: Normal rate, regular rhythm and normal heart sounds.   Pulmonary/Chest: Effort normal and breath sounds normal.  Musculoskeletal: Normal range of motion.  Neurological: She is alert and oriented to person, place, and time.  Skin: Skin is warm and dry.  Psychiatric: She has a normal mood and affect. Her behavior is normal.  Nursing note and vitals reviewed.     Assessment & Plan:   Problem List Items Addressed This Visit    None    Visit Diagnoses    Upper respiratory tract infection, unspecified type    -  Primary   Given duration and worsening course, will treat with z-pak, tessalon, and tussionex. Supportive care discussed. Follow up if worsening or no improvement   Relevant Medications   azithromycin (ZITHROMAX) 250 MG tablet      Follow up plan: Return if symptoms worsen or fail to improve.

## 2016-03-11 NOTE — Patient Instructions (Signed)
Follow up as needed

## 2016-03-12 ENCOUNTER — Encounter: Payer: Self-pay | Admitting: Family Medicine

## 2016-04-17 ENCOUNTER — Other Ambulatory Visit: Payer: Self-pay | Admitting: Unknown Physician Specialty

## 2016-04-24 ENCOUNTER — Ambulatory Visit: Payer: Managed Care, Other (non HMO) | Admitting: Family Medicine

## 2016-05-16 ENCOUNTER — Telehealth: Payer: Self-pay

## 2016-05-16 MED ORDER — EMPAGLIFLOZIN 25 MG PO TABS: 25.0000 mg | ORAL_TABLET | Freq: Every day | ORAL | 1 refills | Status: DC

## 2016-05-16 NOTE — Telephone Encounter (Signed)
Jardiance sent to pharmacy.

## 2016-05-16 NOTE — Telephone Encounter (Signed)
Her insurance is no longer covering [place]. They will cover either [place] or Jardiance. Please change.

## 2016-10-01 ENCOUNTER — Other Ambulatory Visit: Payer: Self-pay | Admitting: Family Medicine

## 2016-10-02 NOTE — Telephone Encounter (Signed)
Routing to provider. No follow up on file. 

## 2016-11-07 ENCOUNTER — Other Ambulatory Visit: Payer: Self-pay | Admitting: Family Medicine

## 2016-12-17 ENCOUNTER — Other Ambulatory Visit: Payer: Self-pay | Admitting: Family Medicine

## 2016-12-17 ENCOUNTER — Telehealth: Payer: Self-pay | Admitting: Unknown Physician Specialty

## 2016-12-17 MED ORDER — LOSARTAN POTASSIUM 50 MG PO TABS: 50.0000 mg | ORAL_TABLET | Freq: Every day | ORAL | 6 refills | Status: DC

## 2016-12-17 MED ORDER — AMLODIPINE BESYLATE 5 MG PO TABS: 5.0000 mg | ORAL_TABLET | Freq: Every day | ORAL | 6 refills | Status: DC

## 2016-12-17 NOTE — Telephone Encounter (Addendum)
Patient requesting a refill on BP medication due to being completely out. Patient unsure of the names of the medications. Patient scheduled a medication fu appt. With provider for 12/17/2016.  Patient would like to be notified once medication has been refilled.  Please Advise.  Thank you

## 2016-12-17 NOTE — Telephone Encounter (Signed)
Pt requesting BP medications. Has appt on 12/22/16. Completely out of medications. Please advise.

## 2016-12-17 NOTE — Telephone Encounter (Signed)
Entered in error

## 2016-12-22 ENCOUNTER — Encounter: Payer: Self-pay | Admitting: Family Medicine

## 2016-12-22 ENCOUNTER — Ambulatory Visit (INDEPENDENT_AMBULATORY_CARE_PROVIDER_SITE_OTHER): Payer: Managed Care, Other (non HMO) | Admitting: Family Medicine

## 2016-12-22 VITALS — BP 107/68 | HR 98 | Temp 98.2°F | Wt 229.0 lb

## 2016-12-22 DIAGNOSIS — E782 Mixed hyperlipidemia: Secondary | ICD-10-CM | POA: Diagnosis not present

## 2016-12-22 DIAGNOSIS — F330 Major depressive disorder, recurrent, mild: Secondary | ICD-10-CM | POA: Diagnosis not present

## 2016-12-22 DIAGNOSIS — I129 Hypertensive chronic kidney disease with stage 1 through stage 4 chronic kidney disease, or unspecified chronic kidney disease: Secondary | ICD-10-CM | POA: Diagnosis not present

## 2016-12-22 DIAGNOSIS — E1122 Type 2 diabetes mellitus with diabetic chronic kidney disease: Secondary | ICD-10-CM

## 2016-12-22 MED ORDER — ATENOLOL-CHLORTHALIDONE 100-25 MG PO TABS: 1.0000 | ORAL_TABLET | Freq: Every day | ORAL | 6 refills | Status: DC

## 2016-12-22 MED ORDER — ATORVASTATIN CALCIUM 20 MG PO TABS: 20.0000 mg | ORAL_TABLET | Freq: Every day | ORAL | 6 refills | Status: DC

## 2016-12-22 MED ORDER — METFORMIN HCL 500 MG PO TABS: 1000.0000 mg | ORAL_TABLET | Freq: Two times a day (BID) | ORAL | 6 refills | Status: DC

## 2016-12-22 MED ORDER — LOSARTAN POTASSIUM 50 MG PO TABS: 50.0000 mg | ORAL_TABLET | Freq: Every day | ORAL | 6 refills | Status: DC

## 2016-12-22 MED ORDER — VENLAFAXINE HCL 100 MG PO TABS: 100.0000 mg | ORAL_TABLET | Freq: Two times a day (BID) | ORAL | 6 refills | Status: DC

## 2016-12-22 MED ORDER — AMLODIPINE BESYLATE 5 MG PO TABS: 5.0000 mg | ORAL_TABLET | Freq: Every day | ORAL | 6 refills | Status: DC

## 2016-12-22 NOTE — Patient Instructions (Signed)
Follow up in 6 months 

## 2016-12-22 NOTE — Progress Notes (Signed)
BP 107/68 (BP Location: Left Arm, Patient Position: Sitting, Cuff Size: Large)   Pulse 98   Temp 98.2 F (36.8 C)   Wt 229 lb (103.9 kg)   LMP  (LMP Unknown)   SpO2 98%   BMI 41.88 kg/m    Subjective:    Patient ID: Holly Norman, female    DOB: Jul 10, 1970, 46 y.o.   MRN: [id number]  HPI: Holly Norman is a 46 y.o. female  Chief Complaint  Patient presents with  . Follow-up  . Medication Management  . Nasal Congestion   Patient presents today for chronic dz management. Doing very well overall, no concerns.   Ran out of her effexor recently and has definitely noticed a difference being off of it. Feels very well controlled when taking regularly, no side effects noted.   BPs doing very well, checks frequently at work and always gets below 120/70s. Taking medications faithfully without side effects. Denies CP, SOB, palpitations, syncope.   Notes she still hasn't been able to stick with lifestyle modifications like she hopes to, will continue to work on that. Had some success with keto for a month or two but has gotten off track and regained her weight back. Also states she couldn't afford the invokana so has just been taking the metformin. Taking lipitor with no side effects as well.   Depression screen [facility] 2/9 12/22/2016 02/19/2016 01/10/2016  Decreased Interest 0 0 0  Down, Depressed, Hopeless 0 0 3  PHQ - 2 Score 0 0 3  Altered sleeping 2 - 3  Tired, decreased energy 0 - 3  Change in appetite 0 - 0  Feeling bad or failure about yourself  0 - 0  Trouble concentrating 2 - 3  Moving slowly or fidgety/restless 2 - 0  Suicidal thoughts 0 - 0  PHQ-9 Score 6 - 12  Difficult doing work/chores Not difficult at all - Somewhat difficult   Past Medical History:  Diagnosis Date  . Diabetes mellitus without complication ([place])   . Hyperlipidemia   . Hypertension    Social History   Social History  . Marital status: Married    Spouse name: N/A  . Number of children: N/A    . Years of education: N/A   Occupational History  . Not on file.   Social History Main Topics  . Smoking status: Never Smoker  . Smokeless tobacco: Never Used  . Alcohol use No  . Drug use: No  . Sexual activity: Yes   Other Topics Concern  . Not on file   Social History Narrative  . No narrative on file    Relevant past medical, surgical, family and social history reviewed and updated as indicated. Interim medical history since our last visit reviewed. Allergies and medications reviewed and updated.  Review of Systems  Constitutional: Negative.   Eyes: Negative.   Respiratory: Negative.   Cardiovascular: Negative.   Gastrointestinal: Negative.   Genitourinary: Negative.   Musculoskeletal: Negative.   Skin: Negative.   Neurological: Negative.   Psychiatric/Behavioral: Negative.    Per HPI unless specifically indicated above     Objective:    BP 107/68 (BP Location: Left Arm, Patient Position: Sitting, Cuff Size: Large)   Pulse 98   Temp 98.2 F (36.8 C)   Wt 229 lb (103.9 kg)   LMP  (LMP Unknown)   SpO2 98%   BMI 41.88 kg/m   Wt Readings from Last 3 Encounters:  12/22/16 229 lb (103.9 kg)  03/11/16 225 lb (102.1 kg)  03/04/16 223 lb (101.2 kg)    Physical Exam  Constitutional: She is oriented to person, place, and time. She appears well-developed and well-nourished. No distress.  HENT:  Head: Atraumatic.  Eyes: Pupils are equal, round, and reactive to light. Conjunctivae are normal.  Neck: Normal range of motion. Neck supple.  Cardiovascular: Normal rate and normal heart sounds.   Pulmonary/Chest: Effort normal and breath sounds normal. No respiratory distress.  Musculoskeletal: Normal range of motion.  Neurological: She is alert and oriented to person, place, and time.  Skin: Skin is warm and dry.  Psychiatric: She has a normal mood and affect. Her behavior is normal.  Nursing note and vitals reviewed.     Assessment & Plan:   Problem List  Items Addressed This Visit      Endocrine   Diabetes ([place])    Had sent in [place] about 7 months ago as insurance requested that over invokana but pt unaware. Will check with pharmacy and pick up if affordable. Continue metformin. Work on lifestyle modifications. Await A1C.       Relevant Medications   atenolol-chlorthalidone (TENORETIC) 100-25 MG tablet   losartan (COZAAR) 50 MG tablet   atorvastatin (LIPITOR) 20 MG tablet   metFORMIN (GLUCOPHAGE) 500 MG tablet   Other Relevant Orders   HgB A1c     Genitourinary   Hypertensive CKD (chronic kidney disease)    Await labs, BPs under great control. Continue current regimen      Relevant Orders   Comprehensive metabolic panel   UA/M w/rflx Culture, Routine     Other   Hyperlipidemia    Continue lipitor, await fasting labs. Work on lifestyle modifications diligently      Relevant Medications   amLODipine (NORVASC) 5 MG tablet   atenolol-chlorthalidone (TENORETIC) 100-25 MG tablet   losartan (COZAAR) 50 MG tablet   atorvastatin (LIPITOR) 20 MG tablet   Other Relevant Orders   Lipid Panel w/o Chol/HDL Ratio   Depression - Primary    Well controlled with effexor, continue current regimen      Relevant Medications   venlafaxine (EFFEXOR) 100 MG tablet       Follow up plan: Return in about 6 months (around 06/22/2017) for HTN, DM, HDL, Depression.

## 2016-12-23 NOTE — Assessment & Plan Note (Signed)
Had sent in Holly Norman about 7 months ago as insurance requested that over invokana but pt unaware. Will check with pharmacy and pick up if affordable. Continue metformin. Work on lifestyle modifications. Await A1C.

## 2016-12-23 NOTE — Assessment & Plan Note (Signed)
Await labs, BPs under great control. Continue current regimen

## 2016-12-23 NOTE — Assessment & Plan Note (Signed)
Well controlled with effexor, continue current regimen

## 2016-12-23 NOTE — Assessment & Plan Note (Signed)
Continue lipitor, await fasting labs. Work on lifestyle modifications diligently

## 2016-12-25 ENCOUNTER — Other Ambulatory Visit: Payer: Managed Care, Other (non HMO)

## 2016-12-25 DIAGNOSIS — I129 Hypertensive chronic kidney disease with stage 1 through stage 4 chronic kidney disease, or unspecified chronic kidney disease: Secondary | ICD-10-CM

## 2016-12-25 DIAGNOSIS — E1122 Type 2 diabetes mellitus with diabetic chronic kidney disease: Secondary | ICD-10-CM

## 2016-12-25 DIAGNOSIS — E782 Mixed hyperlipidemia: Secondary | ICD-10-CM

## 2016-12-25 LAB — UA/M W/RFLX CULTURE, ROUTINE
Bilirubin, UA: NEGATIVE
Glucose, UA: NEGATIVE
KETONES UA: NEGATIVE
LEUKOCYTES UA: NEGATIVE
Nitrite, UA: NEGATIVE
PROTEIN UA: NEGATIVE
RBC UA: NEGATIVE
SPEC GRAV UA: 1.025 (ref 1.005–1.030)
Urobilinogen, Ur: 0.2 mg/dL (ref 0.2–1.0)
pH, UA: 5.5 (ref 5.0–7.5)

## 2016-12-26 ENCOUNTER — Telehealth: Payer: Self-pay | Admitting: Family Medicine

## 2016-12-26 ENCOUNTER — Other Ambulatory Visit: Payer: Self-pay | Admitting: Family Medicine

## 2016-12-26 LAB — COMPREHENSIVE METABOLIC PANEL
ALK PHOS: 64 IU/L (ref 39–117)
ALT: 39 IU/L — AB (ref 0–32)
AST: 30 IU/L (ref 0–40)
Albumin/Globulin Ratio: 1.5 (ref 1.2–2.2)
Albumin: 4.1 g/dL (ref 3.5–5.5)
BILIRUBIN TOTAL: 0.9 mg/dL (ref 0.0–1.2)
BUN/Creatinine Ratio: 16 (ref 9–23)
BUN: 11 mg/dL (ref 6–24)
CHLORIDE: 99 mmol/L (ref 96–106)
CO2: 21 mmol/L (ref 20–29)
Calcium: 9.6 mg/dL (ref 8.7–10.2)
Creatinine, Ser: 0.67 mg/dL (ref 0.57–1.00)
GFR calc Af Amer: 122 mL/min/{1.73_m2} (ref >59)
GFR calc non Af Amer: 106 mL/min/{1.73_m2} (ref >59)
GLUCOSE: 168 mg/dL — AB (ref 65–99)
Globulin, Total: 2.8 g/dL (ref 1.5–4.5)
Potassium: 4.7 mmol/L (ref 3.5–5.2)
Sodium: 136 mmol/L (ref 134–144)
Total Protein: 6.9 g/dL (ref 6.0–8.5)

## 2016-12-26 LAB — HEMOGLOBIN A1C
Est. average glucose Bld gHb Est-mCnc: 209 mg/dL
HEMOGLOBIN A1C: 8.9 % — AB (ref 4.8–5.6)

## 2016-12-26 LAB — LIPID PANEL W/O CHOL/HDL RATIO
CHOLESTEROL TOTAL: 217 mg/dL — AB (ref 100–199)
HDL: 34 mg/dL — AB (ref >39)
LDL Calculated: 123 mg/dL — ABNORMAL HIGH (ref 0–99)
Triglycerides: 299 mg/dL — ABNORMAL HIGH (ref 0–149)
VLDL CHOLESTEROL CAL: 60 mg/dL — AB (ref 5–40)

## 2016-12-26 MED ORDER — ATORVASTATIN CALCIUM 40 MG PO TABS: 40.0000 mg | ORAL_TABLET | Freq: Every day | ORAL | 1 refills | Status: DC

## 2016-12-26 NOTE — Telephone Encounter (Signed)
Please call and let her know that her cholesterol came back mildly improved but still needs quite a bit of improvement. I am sending over an increased dose of lipitor for her to start. Her A1C went from 7.9 to 8.9 so in addition to the metformin she needs to add the jardiance (if covered). We had sent it over to the pharmacy several months ago, she should check if that's available and let us know if not. We will recheck things in 3 months.

## 2016-12-26 NOTE — Telephone Encounter (Signed)
Patient notified

## 2017-06-22 ENCOUNTER — Ambulatory Visit: Payer: Managed Care, Other (non HMO) | Admitting: Unknown Physician Specialty

## 2017-08-29 ENCOUNTER — Other Ambulatory Visit: Payer: Self-pay | Admitting: Family Medicine

## 2017-08-31 NOTE — Telephone Encounter (Signed)
Atenolol-chlorthalidone refill Last Refill:12/22/16 # 30tab 6 RF Last OV: 12/22/16 PCP: Kathrine Haddock NP [place]

## 2017-09-30 ENCOUNTER — Other Ambulatory Visit: Payer: Self-pay | Admitting: Family Medicine

## 2017-10-14 ENCOUNTER — Ambulatory Visit: Payer: Managed Care, Other (non HMO) | Admitting: Family Medicine

## 2017-10-14 ENCOUNTER — Other Ambulatory Visit: Payer: Self-pay

## 2017-10-14 ENCOUNTER — Encounter: Payer: Self-pay | Admitting: Family Medicine

## 2017-10-14 VITALS — BP 120/77 | HR 85 | Temp 98.3°F | Wt 223.4 lb

## 2017-10-14 DIAGNOSIS — E1122 Type 2 diabetes mellitus with diabetic chronic kidney disease: Secondary | ICD-10-CM

## 2017-10-14 DIAGNOSIS — R42 Dizziness and giddiness: Secondary | ICD-10-CM

## 2017-10-14 DIAGNOSIS — E782 Mixed hyperlipidemia: Secondary | ICD-10-CM | POA: Diagnosis not present

## 2017-10-14 DIAGNOSIS — F330 Major depressive disorder, recurrent, mild: Secondary | ICD-10-CM | POA: Diagnosis not present

## 2017-10-14 DIAGNOSIS — I129 Hypertensive chronic kidney disease with stage 1 through stage 4 chronic kidney disease, or unspecified chronic kidney disease: Secondary | ICD-10-CM | POA: Diagnosis not present

## 2017-10-14 DIAGNOSIS — L0292 Furuncle, unspecified: Secondary | ICD-10-CM

## 2017-10-14 MED ORDER — VENLAFAXINE HCL 100 MG PO TABS
100.0000 mg | ORAL_TABLET | Freq: Two times a day (BID) | ORAL | 6 refills | Status: DC
Start: 2017-10-14 — End: 2018-12-16

## 2017-10-14 MED ORDER — SULFAMETHOXAZOLE-TRIMETHOPRIM 800-160 MG PO TABS: 1.0000 | ORAL_TABLET | Freq: Two times a day (BID) | ORAL | 0 refills | Status: DC

## 2017-10-14 MED ORDER — METFORMIN HCL 500 MG PO TABS: 1000.0000 mg | ORAL_TABLET | Freq: Two times a day (BID) | ORAL | 6 refills | Status: DC

## 2017-10-14 MED ORDER — ATENOLOL-CHLORTHALIDONE 100-25 MG PO TABS: 1.0000 | ORAL_TABLET | Freq: Every day | ORAL | 6 refills | Status: DC

## 2017-10-14 MED ORDER — LOSARTAN POTASSIUM 50 MG PO TABS: 50.0000 mg | ORAL_TABLET | Freq: Every day | ORAL | 6 refills | Status: DC

## 2017-10-14 MED ORDER — MECLIZINE HCL 25 MG PO TABS: 25.0000 mg | ORAL_TABLET | Freq: Three times a day (TID) | ORAL | 1 refills | Status: DC | PRN

## 2017-10-14 MED ORDER — ATORVASTATIN CALCIUM 20 MG PO TABS: 20.0000 mg | ORAL_TABLET | Freq: Every day | ORAL | 6 refills | Status: DC

## 2017-10-14 MED ORDER — AMLODIPINE BESYLATE 5 MG PO TABS: 5.0000 mg | ORAL_TABLET | Freq: Every day | ORAL | 6 refills | Status: DC

## 2017-10-14 NOTE — Progress Notes (Signed)
BP 120/77   Pulse 85   Temp 98.3 F (36.8 C) (Oral)   Wt 223 lb 6.4 oz (101.3 kg)   LMP  (LMP Unknown)   SpO2 97%   BMI 40.86 kg/m    Subjective:    Patient ID: Holly Norman, female    DOB: 03-09-1971, 47 y.o.   MRN: [id number]  HPI: Holly Norman is a 47 y.o. female  Chief Complaint  Patient presents with  . Depression  . Diabetes  . Hyperlipidemia  . Hypertension  . Recurrent Skin Infections    pt states she has a boil on right hip, states it has been there about a week   . Dizziness    pt states she has positional vertigo on occasion, wants to know if she can have a PRN prescription for meclizine, states she had it in the past    Here today for 6 month f/u chronic medical issues. Has been out of metformin and atorvastatin x 2 weeks. Compliant with all medications otherwise without any reported side effects. Does not check BPs often outside of clinic, and does not check BSs at home. Denies Cp, SOB, dizziness, syncope, claudication, myalgias, hypoglycemia.   Moods doing well on effexor. Denies SI/HI, severe mood swings, hopelessness.   Has been dealing with about a week of a red, swollen and tender place on her right hip. Using compresses and tylenol with mild relief. Denies fevers, known injury, drainage.   Has had chronic intermittent vertigo, out of meclizine which always helps. Has seen ENT in the past for maneuvers.   Past Medical History:  Diagnosis Date  . Diabetes mellitus without complication ([place])   . Hyperlipidemia   . Hypertension    Social History   Socioeconomic History  . Marital status: Married    Spouse name: Not on file  . Number of children: Not on file  . Years of education: Not on file  . Highest education level: Not on file  Occupational History  . Not on file  Social Needs  . Financial resource strain: Not on file  . Food insecurity:    Worry: Not on file    Inability: Not on file  . Transportation needs:    Medical: Not on file      Non-medical: Not on file  Tobacco Use  . Smoking status: Never Smoker  . Smokeless tobacco: Never Used  Substance and Sexual Activity  . Alcohol use: No    Alcohol/week: 0.0 standard drinks  . Drug use: No  . Sexual activity: Yes  Lifestyle  . Physical activity:    Days per week: Not on file    Minutes per session: Not on file  . Stress: Not on file  Relationships  . Social connections:    Talks on phone: Not on file    Gets together: Not on file    Attends religious service: Not on file    Active member of club or organization: Not on file    Attends meetings of clubs or organizations: Not on file    Relationship status: Not on file  . Intimate partner violence:    Fear of current or ex partner: Not on file    Emotionally abused: Not on file    Physically abused: Not on file    Forced sexual activity: Not on file  Other Topics Concern  . Not on file  Social History Narrative  . Not on file    Relevant past medical,  surgical, family and social history reviewed and updated as indicated. Interim medical history since our last visit reviewed. Allergies and medications reviewed and updated.  Review of Systems  Per HPI unless specifically indicated above     Objective:    BP 120/77   Pulse 85   Temp 98.3 F (36.8 C) (Oral)   Wt 223 lb 6.4 oz (101.3 kg)   LMP  (LMP Unknown)   SpO2 97%   BMI 40.86 kg/m   Wt Readings from Last 3 Encounters:  10/14/17 223 lb 6.4 oz (101.3 kg)  12/22/16 229 lb (103.9 kg)  03/11/16 225 lb (102.1 kg)    Physical Exam  Constitutional: She is oriented to person, place, and time. She appears well-developed and well-nourished. No distress.  HENT:  Head: Atraumatic.  Eyes: Conjunctivae and EOM are normal.  Neck: Normal range of motion. Neck supple.  Cardiovascular: Normal rate and regular rhythm.  Pulmonary/Chest: Effort normal and breath sounds normal.  Musculoskeletal: Normal range of motion.  Neurological: She is alert and  oriented to person, place, and time.  Skin: Skin is warm and dry. There is erythema (from firm abscess right hip).  Psychiatric: She has a normal mood and affect. Her behavior is normal.  Nursing note and vitals reviewed.   Results for orders placed or performed in visit on 10/14/17  CBC with Differential/Platelet  Result Value Ref Range   WBC 6.8 3.4 - 10.8 x10E3/uL   RBC 4.16 3.77 - 5.28 x10E6/uL   Hemoglobin 13.5 11.1 - 15.9 g/dL   Hematocrit 39.4 34.0 - 46.6 %   MCV 95 79 - 97 fL   MCH 32.5 26.6 - 33.0 pg   MCHC 34.3 31.5 - 35.7 g/dL   RDW 14.3 12.3 - 15.4 %   Platelets 247 150 - 450 x10E3/uL   Neutrophils 63 Not Estab. %   Lymphs 28 Not Estab. %   Monocytes 6 Not Estab. %   Eos 3 Not Estab. %   Basos 0 Not Estab. %   Neutrophils Absolute 4.2 1.4 - 7.0 x10E3/uL   Lymphocytes Absolute 1.9 0.7 - 3.1 x10E3/uL   Monocytes Absolute 0.4 0.1 - 0.9 x10E3/uL   EOS (ABSOLUTE) 0.2 0.0 - 0.4 x10E3/uL   Basophils Absolute 0.0 0.0 - 0.2 x10E3/uL   Immature Granulocytes 0 Not Estab. %   Immature Grans (Abs) 0.0 0.0 - 0.1 x10E3/uL  Comprehensive metabolic panel  Result Value Ref Range   Glucose 217 (H) 65 - 99 mg/dL   BUN 12 6 - 24 mg/dL   Creatinine, Ser 0.71 0.57 - 1.00 mg/dL   GFR calc non Af Amer 102 >59 mL/min/1.73   GFR calc Af Amer 117 >59 mL/min/1.73   BUN/Creatinine Ratio 17 9 - 23   Sodium 135 134 - 144 mmol/L   Potassium 4.2 3.5 - 5.2 mmol/L   Chloride 93 (L) 96 - 106 mmol/L   CO2 22 20 - 29 mmol/L   Calcium 9.5 8.7 - 10.2 mg/dL   Total Protein 6.7 6.0 - 8.5 g/dL   Albumin 4.2 3.5 - 5.5 g/dL   Globulin, Total 2.5 1.5 - 4.5 g/dL   Albumin/Globulin Ratio 1.7 1.2 - 2.2   Bilirubin Total 0.7 0.0 - 1.2 mg/dL   Alkaline Phosphatase 87 39 - 117 IU/L   AST 25 0 - 40 IU/L   ALT 27 0 - 32 IU/L  Lipid Panel w/o Chol/HDL Ratio  Result Value Ref Range   Cholesterol, Total 256 (H) 100 -  199 mg/dL   Triglycerides 420 (H) 0 - 149 mg/dL   HDL 38 (L) >39 mg/dL   VLDL Cholesterol  Cal Comment 5 - 40 mg/dL   LDL Calculated Comment 0 - 99 mg/dL  HgB A1c  Result Value Ref Range   Hgb A1c MFr Bld 11.1 (H) 4.8 - 5.6 %   Est. average glucose Bld gHb Est-mCnc 272 mg/dL      Assessment & Plan:   Problem List Items Addressed This Visit      Endocrine   Diabetes ([place]) - Primary    Restart metformin, check A1C and adjust as needed. Lifestyle modifications encouraged      Relevant Medications   losartan (COZAAR) 50 MG tablet   atenolol-chlorthalidone (TENORETIC) 100-25 MG tablet   metFORMIN (GLUCOPHAGE) 500 MG tablet   Other Relevant Orders   HgB A1c (Completed)     Genitourinary   Hypertensive CKD (chronic kidney disease)    BPs stable and WNL, continue current regimen. Will check labs today      Relevant Orders   CBC with Differential/Platelet (Completed)   Comprehensive metabolic panel (Completed)     Other   Hyperlipidemia    Will check lipid panel and adjust as needed. Long discussion about lifestyle modifications      Relevant Medications   losartan (COZAAR) 50 MG tablet   atenolol-chlorthalidone (TENORETIC) 100-25 MG tablet   amLODipine (NORVASC) 5 MG tablet   Other Relevant Orders   Lipid Panel w/o Chol/HDL Ratio (Completed)   Depression    Moods stable, pt feels she's doing well. Continue current regimen      Relevant Medications   venlafaxine (EFFEXOR) 100 MG tablet    Other Visit Diagnoses    Boil       Non-fluctuant currently, no need to drain. Bactrim, warm compresses, close monitoring. Recheck in 1 week if not fully resolved   Relevant Medications   sulfamethoxazole-trimethoprim (BACTRIM DS,SEPTRA DS) 800-160 MG tablet   Vertigo       Meclizine refilled, epley maneuvers handout given. F/u if not improving       Follow up plan: Return in about 3 months (around 01/14/2018) for A1C.

## 2017-10-14 NOTE — Patient Instructions (Signed)

## 2017-10-15 LAB — LIPID PANEL W/O CHOL/HDL RATIO
CHOLESTEROL TOTAL: 256 mg/dL — AB (ref 100–199)
HDL: 38 mg/dL — ABNORMAL LOW (ref >39)
Triglycerides: 420 mg/dL — ABNORMAL HIGH (ref 0–149)

## 2017-10-15 LAB — CBC WITH DIFFERENTIAL/PLATELET
BASOS ABS: 0 10*3/uL (ref 0.0–0.2)
Basos: 0 %
EOS (ABSOLUTE): 0.2 10*3/uL (ref 0.0–0.4)
Eos: 3 %
HEMATOCRIT: 39.4 % (ref 34.0–46.6)
Hemoglobin: 13.5 g/dL (ref 11.1–15.9)
Immature Grans (Abs): 0 10*3/uL (ref 0.0–0.1)
Immature Granulocytes: 0 %
LYMPHS ABS: 1.9 10*3/uL (ref 0.7–3.1)
Lymphs: 28 %
MCH: 32.5 pg (ref 26.6–33.0)
MCHC: 34.3 g/dL (ref 31.5–35.7)
MCV: 95 fL (ref 79–97)
Monocytes Absolute: 0.4 10*3/uL (ref 0.1–0.9)
Monocytes: 6 %
NEUTROS ABS: 4.2 10*3/uL (ref 1.4–7.0)
Neutrophils: 63 %
Platelets: 247 10*3/uL (ref 150–450)
RBC: 4.16 x10E6/uL (ref 3.77–5.28)
RDW: 14.3 % (ref 12.3–15.4)
WBC: 6.8 10*3/uL (ref 3.4–10.8)

## 2017-10-15 LAB — COMPREHENSIVE METABOLIC PANEL
ALBUMIN: 4.2 g/dL (ref 3.5–5.5)
ALK PHOS: 87 IU/L (ref 39–117)
ALT: 27 IU/L (ref 0–32)
AST: 25 IU/L (ref 0–40)
Albumin/Globulin Ratio: 1.7 (ref 1.2–2.2)
BILIRUBIN TOTAL: 0.7 mg/dL (ref 0.0–1.2)
BUN / CREAT RATIO: 17 (ref 9–23)
BUN: 12 mg/dL (ref 6–24)
CHLORIDE: 93 mmol/L — AB (ref 96–106)
CO2: 22 mmol/L (ref 20–29)
CREATININE: 0.71 mg/dL (ref 0.57–1.00)
Calcium: 9.5 mg/dL (ref 8.7–10.2)
GFR calc Af Amer: 117 mL/min/{1.73_m2} (ref >59)
GFR calc non Af Amer: 102 mL/min/{1.73_m2} (ref >59)
GLOBULIN, TOTAL: 2.5 g/dL (ref 1.5–4.5)
GLUCOSE: 217 mg/dL — AB (ref 65–99)
POTASSIUM: 4.2 mmol/L (ref 3.5–5.2)
SODIUM: 135 mmol/L (ref 134–144)
Total Protein: 6.7 g/dL (ref 6.0–8.5)

## 2017-10-15 LAB — HEMOGLOBIN A1C
Est. average glucose Bld gHb Est-mCnc: 272 mg/dL
Hgb A1c MFr Bld: 11.1 % — ABNORMAL HIGH (ref 4.8–5.6)

## 2017-10-19 ENCOUNTER — Other Ambulatory Visit: Payer: Self-pay | Admitting: Family Medicine

## 2017-10-19 MED ORDER — ATORVASTATIN CALCIUM 40 MG PO TABS: 40.0000 mg | ORAL_TABLET | Freq: Every day | ORAL | 3 refills | Status: DC

## 2017-10-19 MED ORDER — EMPAGLIFLOZIN 10 MG PO TABS: 10.0000 mg | ORAL_TABLET | Freq: Every day | ORAL | 2 refills | Status: DC

## 2017-10-20 NOTE — Assessment & Plan Note (Signed)
Will check lipid panel and adjust as needed. Long discussion about lifestyle modifications

## 2017-10-20 NOTE — Assessment & Plan Note (Signed)
Restart metformin, check A1C and adjust as needed. Lifestyle modifications encouraged

## 2017-10-20 NOTE — Assessment & Plan Note (Signed)
BPs stable and WNL, continue current regimen. Will check labs today

## 2017-10-20 NOTE — Assessment & Plan Note (Signed)
Moods stable, pt feels she's doing well. Continue current regimen

## 2018-04-02 ENCOUNTER — Ambulatory Visit: Payer: Self-pay | Admitting: *Deleted

## 2018-04-02 NOTE — Telephone Encounter (Signed)
Will need appt, last saw her for this issue 10/2017. Furthermore, she is overdue for a f/u since 01/2018

## 2018-04-02 NOTE — Telephone Encounter (Signed)
Spoke with pt and unfortunately she is unable to get off work until 04/12/2018. Appt made for 04/12/2018.

## 2018-04-02 NOTE — Telephone Encounter (Signed)
Summary: Developing Boil   Pt stated that a boil on her right hip are she had previously has returned. She stated it is hard and warm. She would like to know if something can be sent in to her pharmacy to help with this until she can come in for her OV on 04/12/18 CB# [phone]

## 2018-04-02 NOTE — Telephone Encounter (Signed)
  Reason for Disposition . [1] Spreading redness around the boil AND [2] no fever  Answer Assessment - Initial Assessment Questions 1. APPEARANCE of BOIL: "What does the boil look like?"      2 small hard areas- warm quarter size red area with 2 small pencil size black dots in it- sore- and deep 2. LOCATION: "Where is the boil located?"      R hip 3. NUMBER: "How many boils are there?"      1 area- with 2 heads 4. SIZE: "How big is the boil?" (e.g., inches, cm; compare to size of a coin or other object)     Quarter size 5. ONSET: "When did the boil start?"     Felt it yesterday 6. PAIN: "Is there any pain?" If so, ask: "How bad is the pain?"   (Scale 1-10; or mild, moderate, severe)     Yes- 5 when touched- tender 7. FEVER: "Do you have a fever?" If so, ask: "What is it, how was it measured, and when did it start?"      no 8. SOURCE: "Have you been around anyone with boils or other Staph infections?" "Have you ever had boils before?"     reoccurrence same area 9. OTHER SYMPTOMS: "Do you have any other symptoms?" (e.g., shaking chills, weakness, rash elsewhere on body)     no 10. PREGNANCY: "Is there any chance you are pregnant?" "When was your last menstrual period?"       n/a  Protocols used: BOIL (SKIN ABSCESS)-A-AH

## 2018-04-12 ENCOUNTER — Ambulatory Visit: Payer: Managed Care, Other (non HMO) | Admitting: Family Medicine

## 2018-04-12 ENCOUNTER — Encounter: Payer: Self-pay | Admitting: Family Medicine

## 2018-04-12 VITALS — BP 116/78 | HR 85 | Temp 98.5°F | Ht 62.0 in | Wt 221.0 lb

## 2018-04-12 DIAGNOSIS — L0292 Furuncle, unspecified: Secondary | ICD-10-CM | POA: Diagnosis not present

## 2018-04-12 DIAGNOSIS — R2231 Localized swelling, mass and lump, right upper limb: Secondary | ICD-10-CM

## 2018-04-12 DIAGNOSIS — R002 Palpitations: Secondary | ICD-10-CM | POA: Diagnosis not present

## 2018-04-12 NOTE — Progress Notes (Signed)
BP 116/78 (BP Location: Right Arm, Patient Position: Sitting, Cuff Size: Large)   Pulse 85   Temp 98.5 F (36.9 C)   Ht 5\' 2"  (1.575 m)   Wt 221 lb (100.2 kg)   LMP  (LMP Unknown)   SpO2 100%   BMI 40.42 kg/m    Subjective:    Patient ID: Holly Norman, female    DOB: 19-Apr-1970, 48 y.o.   MRN: [id number]  HPI: Holly Norman is a 48 y.o. female  Chief Complaint  Patient presents with  . Recurrent Skin Infections  . Palpitations  . Mass    under right arm x 3 weeks, not painful   Here today following up on inflamed cyst recurring on right hip. Started becoming warm, swollen and painful about 1.5 weeks ago. Using hot compresses and topical antibiotic ointment and notes the area is significantly less inflamed and minimally painful. Has not noticed any draingae. No fevers, chills, injury to area.   Noticed a firm mass in right axilla for 3 weeks. Not painful, no redness, no fevers.   Hx of cardiac ablation at [facility] 17 years ago, has been stable on atenolol ever since until the past week. Having palpitations intermittently with and without activity. Sees Dr. Marcelline Deist now for Cardiology f/u.   Relevant past medical, surgical, family and social history reviewed and updated as indicated. Interim medical history since our last visit reviewed. Allergies and medications reviewed and updated.  Review of Systems  Per HPI unless specifically indicated above     Objective:    BP 116/78 (BP Location: Right Arm, Patient Position: Sitting, Cuff Size: Large)   Pulse 85   Temp 98.5 F (36.9 C)   Ht 5\' 2"  (1.575 m)   Wt 221 lb (100.2 kg)   LMP  (LMP Unknown)   SpO2 100%   BMI 40.42 kg/m   Wt Readings from Last 3 Encounters:  04/12/18 221 lb (100.2 kg)  10/14/17 223 lb 6.4 oz (101.3 kg)  12/22/16 229 lb (103.9 kg)    Physical Exam Vitals signs and nursing note reviewed.  Constitutional:      Appearance: Normal appearance. She is not ill-appearing.  HENT:     Head:  Atraumatic.  Eyes:     Extraocular Movements: Extraocular movements intact.     Conjunctiva/sclera: Conjunctivae normal.  Neck:     Musculoskeletal: Normal range of motion and neck supple.  Cardiovascular:     Rate and Rhythm: Normal rate and regular rhythm.     Heart sounds: Normal heart sounds.  Pulmonary:     Effort: Pulmonary effort is normal.     Breath sounds: Normal breath sounds.  Chest:     Comments: Diffuse dense tissue right breast Musculoskeletal: Normal range of motion.  Lymphadenopathy:     Upper Body:     Right upper body: Axillary adenopathy (semi firm palpablee mass right axilla) present.     Left upper body: No axillary adenopathy.  Skin:    General: Skin is warm and dry.     Comments: Minimally palpable soft mass right hip, erythematous, non tender. Does have a pinpoint natural opening in center  Neurological:     Mental Status: She is alert and oriented to person, place, and time.  Psychiatric:        Mood and Affect: Mood normal.        Thought Content: Thought content normal.        Judgment: Judgment normal.  Results for orders placed or performed in visit on 10/14/17  CBC with Differential/Platelet  Result Value Ref Range   WBC 6.8 3.4 - 10.8 x10E3/uL   RBC 4.16 3.77 - 5.28 x10E6/uL   Hemoglobin 13.5 11.1 - 15.9 g/dL   Hematocrit 39.4 34.0 - 46.6 %   MCV 95 79 - 97 fL   MCH 32.5 26.6 - 33.0 pg   MCHC 34.3 31.5 - 35.7 g/dL   RDW 14.3 12.3 - 15.4 %   Platelets 247 150 - 450 x10E3/uL   Neutrophils 63 Not Estab. %   Lymphs 28 Not Estab. %   Monocytes 6 Not Estab. %   Eos 3 Not Estab. %   Basos 0 Not Estab. %   Neutrophils Absolute 4.2 1.4 - 7.0 x10E3/uL   Lymphocytes Absolute 1.9 0.7 - 3.1 x10E3/uL   Monocytes Absolute 0.4 0.1 - 0.9 x10E3/uL   EOS (ABSOLUTE) 0.2 0.0 - 0.4 x10E3/uL   Basophils Absolute 0.0 0.0 - 0.2 x10E3/uL   Immature Granulocytes 0 Not Estab. %   Immature Grans (Abs) 0.0 0.0 - 0.1 x10E3/uL  Comprehensive metabolic panel    Result Value Ref Range   Glucose 217 (H) 65 - 99 mg/dL   BUN 12 6 - 24 mg/dL   Creatinine, Ser 0.71 0.57 - 1.00 mg/dL   GFR calc non Af Amer 102 >59 mL/min/1.73   GFR calc Af Amer 117 >59 mL/min/1.73   BUN/Creatinine Ratio 17 9 - 23   Sodium 135 134 - 144 mmol/L   Potassium 4.2 3.5 - 5.2 mmol/L   Chloride 93 (L) 96 - 106 mmol/L   CO2 22 20 - 29 mmol/L   Calcium 9.5 8.7 - 10.2 mg/dL   Total Protein 6.7 6.0 - 8.5 g/dL   Albumin 4.2 3.5 - 5.5 g/dL   Globulin, Total 2.5 1.5 - 4.5 g/dL   Albumin/Globulin Ratio 1.7 1.2 - 2.2   Bilirubin Total 0.7 0.0 - 1.2 mg/dL   Alkaline Phosphatase 87 39 - 117 IU/L   AST 25 0 - 40 IU/L   ALT 27 0 - 32 IU/L  Lipid Panel w/o Chol/HDL Ratio  Result Value Ref Range   Cholesterol, Total 256 (H) 100 - 199 mg/dL   Triglycerides 420 (H) 0 - 149 mg/dL   HDL 38 (L) >39 mg/dL   VLDL Cholesterol Cal Comment 5 - 40 mg/dL   LDL Calculated Comment 0 - 99 mg/dL  HgB A1c  Result Value Ref Range   Hgb A1c MFr Bld 11.1 (H) 4.8 - 5.6 %   Est. average glucose Bld gHb Est-mCnc 272 mg/dL      Assessment & Plan:   Problem List Items Addressed This Visit    None    Visit Diagnoses    Boil    -  Primary   Appears to be resolving. Continue warm compresses, topical antibiotics. F/u if worsening or not improving   Axillary mass, right       axillary U/s and diagnostic mammogram ordered for reassurance. Overdue for mammogram, states it's been years    Relevant Orders   [place] AXILLA RIGHT   MM Digital Diagnostic Bilat   Palpitations       HR normal today. Conitnue atenolol and f/u with Cardiology for further evaluation       Follow up plan: Return in about 2 weeks (around 04/26/2018) for 6 month f/u.

## 2018-04-12 NOTE — Patient Instructions (Signed)
[facility] - [phone]    

## 2018-04-13 ENCOUNTER — Other Ambulatory Visit: Payer: Self-pay | Admitting: Family Medicine

## 2018-04-13 DIAGNOSIS — R2231 Localized swelling, mass and lump, right upper limb: Secondary | ICD-10-CM

## 2018-04-19 ENCOUNTER — Encounter: Payer: Managed Care, Other (non HMO) | Admitting: Family Medicine

## 2018-04-22 ENCOUNTER — Other Ambulatory Visit: Payer: Self-pay | Admitting: Family Medicine

## 2018-04-22 ENCOUNTER — Ambulatory Visit
Admission: RE | Admit: 2018-04-22 | Discharge: 2018-04-22 | Disposition: A | Discharge status: Home / Self Care | Payer: Managed Care, Other (non HMO) | Source: Ambulatory Visit | Attending: Family Medicine | Admitting: Family Medicine

## 2018-04-22 DIAGNOSIS — R2231 Localized swelling, mass and lump, right upper limb: Secondary | ICD-10-CM

## 2018-04-22 DIAGNOSIS — N6310 Unspecified lump in the right breast, unspecified quadrant: Secondary | ICD-10-CM

## 2018-08-26 ENCOUNTER — Other Ambulatory Visit: Payer: Self-pay

## 2018-08-26 ENCOUNTER — Ambulatory Visit (INDEPENDENT_AMBULATORY_CARE_PROVIDER_SITE_OTHER): Payer: 59 | Admitting: Family Medicine

## 2018-08-26 ENCOUNTER — Encounter: Payer: Self-pay | Admitting: Family Medicine

## 2018-08-26 DIAGNOSIS — F419 Anxiety disorder, unspecified: Secondary | ICD-10-CM

## 2018-08-26 DIAGNOSIS — R69 Illness, unspecified: Secondary | ICD-10-CM | POA: Diagnosis not present

## 2018-08-26 DIAGNOSIS — F330 Major depressive disorder, recurrent, mild: Secondary | ICD-10-CM | POA: Diagnosis not present

## 2018-08-26 MED ORDER — BUSPIRONE HCL 10 MG PO TABS: 10.0000 mg | ORAL_TABLET | Freq: Three times a day (TID) | ORAL | 0 refills | Status: DC

## 2018-08-26 NOTE — Assessment & Plan Note (Signed)
Will switch from effexor to buspar given worsening sxs. Recheck in 1 month

## 2018-08-26 NOTE — Progress Notes (Signed)
LMP  (LMP Unknown)    Subjective:    Patient ID: Holly Norman, female    DOB: 09/21/1970, 48 y.o.   MRN: [id number]  HPI: Holly Norman is a 48 y.o. female  Chief Complaint  Patient presents with  . Menopause    Thinks medication she has been on for x2 years is no longer working crying all the time, hot flashes    . This visit was completed via WebEx due to the restrictions of the COVID-19 pandemic. All issues as above were discussed and addressed. Physical exam was done as above through visual confirmation on WebEx. If it was felt that the patient should be evaluated in the office, they were directed there. The patient verbally consented to this visit. . Location of the patient: home . Location of the provider: work . Those involved with this call:  . Provider: Merrie Roof, PA-C . CMA: Gerda Diss, CMA . Front Desk/Registration: Jill Side  . Time spent on call: 15 minutes with patient face to face via video conference. More than 50% of this time was spent in counseling and coordination of care. 5 minutes total spent in review of patient's record and preparation of their chart. I verified patient identity using two factors (patient name and date of birth). Patient consents verbally to being seen via telemedicine visit today.   3 weeks of anxiousness, palpitations, crying constantly, irritability. Seems to hit after work every day. No life changes or medication changes that could have caused it, everything has been fine in her life. Denies SI/HI. Has been on effexor for 2 years which has worked well but feels now it's not working. Has also tried prozac in the past which didn't help much years ago.   Depression screen [facility] 2/9 08/26/2018 10/14/2017 12/22/2016  Decreased Interest 1 0 0  Down, Depressed, Hopeless 1 0 0  PHQ - 2 Score 2 0 0  Altered sleeping 3 3 2   Tired, decreased energy 3 1 0  Change in appetite 0 0 0  Feeling bad or failure about yourself  0 0 0  Trouble  concentrating 3 0 2  Moving slowly or fidgety/restless 0 0 2  Suicidal thoughts 0 0 0  PHQ-9 Score 11 4 6   Difficult doing work/chores - - Not difficult at all   GAD 7 : Generalized Anxiety Score 08/26/2018  Nervous, Anxious, on Edge 3  Control/stop worrying 3  Worry too much - different things 3  Trouble relaxing 3  Restless 1  Easily annoyed or irritable 3  Afraid - awful might happen 1  Total GAD 7 Score 17  Anxiety Difficulty Very difficult    Relevant past medical, surgical, family and social history reviewed and updated as indicated. Interim medical history since our last visit reviewed. Allergies and medications reviewed and updated.  Review of Systems  Per HPI unless specifically indicated above     Objective:    LMP  (LMP Unknown)   Wt Readings from Last 3 Encounters:  04/12/18 221 lb (100.2 kg)  10/14/17 223 lb 6.4 oz (101.3 kg)  12/22/16 229 lb (103.9 kg)    Physical Exam Vitals signs and nursing note reviewed.  Constitutional:      General: She is not in acute distress.    Appearance: Normal appearance.  HENT:     Head: Atraumatic.     Right Ear: External ear normal.     Left Ear: External ear normal.     Nose: Nose  normal. No congestion.     Mouth/Throat:     Mouth: Mucous membranes are moist.     Pharynx: Oropharynx is clear. No posterior oropharyngeal erythema.  Eyes:     Extraocular Movements: Extraocular movements intact.     Conjunctiva/sclera: Conjunctivae normal.  Neck:     Musculoskeletal: Normal range of motion.  Cardiovascular:     Comments: Unable to assess via virtual visit Pulmonary:     Effort: Pulmonary effort is normal. No respiratory distress.  Musculoskeletal: Normal range of motion.  Skin:    General: Skin is dry.     Findings: No erythema.  Neurological:     Mental Status: She is alert and oriented to person, place, and time.  Psychiatric:        Thought Content: Thought content normal.        Judgment: Judgment normal.      Comments: tearful     Results for orders placed or performed in visit on 10/14/17  CBC with Differential/Platelet  Result Value Ref Range   WBC 6.8 3.4 - 10.8 x10E3/uL   RBC 4.16 3.77 - 5.28 x10E6/uL   Hemoglobin 13.5 11.1 - 15.9 g/dL   Hematocrit 39.4 34.0 - 46.6 %   MCV 95 79 - 97 fL   MCH 32.5 26.6 - 33.0 pg   MCHC 34.3 31.5 - 35.7 g/dL   RDW 14.3 12.3 - 15.4 %   Platelets 247 150 - 450 x10E3/uL   Neutrophils 63 Not Estab. %   Lymphs 28 Not Estab. %   Monocytes 6 Not Estab. %   Eos 3 Not Estab. %   Basos 0 Not Estab. %   Neutrophils Absolute 4.2 1.4 - 7.0 x10E3/uL   Lymphocytes Absolute 1.9 0.7 - 3.1 x10E3/uL   Monocytes Absolute 0.4 0.1 - 0.9 x10E3/uL   EOS (ABSOLUTE) 0.2 0.0 - 0.4 x10E3/uL   Basophils Absolute 0.0 0.0 - 0.2 x10E3/uL   Immature Granulocytes 0 Not Estab. %   Immature Grans (Abs) 0.0 0.0 - 0.1 x10E3/uL  Comprehensive metabolic panel  Result Value Ref Range   Glucose 217 (H) 65 - 99 mg/dL   BUN 12 6 - 24 mg/dL   Creatinine, Ser 0.71 0.57 - 1.00 mg/dL   GFR calc non Af Amer 102 >59 mL/min/1.73   GFR calc Af Amer 117 >59 mL/min/1.73   BUN/Creatinine Ratio 17 9 - 23   Sodium 135 134 - 144 mmol/L   Potassium 4.2 3.5 - 5.2 mmol/L   Chloride 93 (L) 96 - 106 mmol/L   CO2 22 20 - 29 mmol/L   Calcium 9.5 8.7 - 10.2 mg/dL   Total Protein 6.7 6.0 - 8.5 g/dL   Albumin 4.2 3.5 - 5.5 g/dL   Globulin, Total 2.5 1.5 - 4.5 g/dL   Albumin/Globulin Ratio 1.7 1.2 - 2.2   Bilirubin Total 0.7 0.0 - 1.2 mg/dL   Alkaline Phosphatase 87 39 - 117 IU/L   AST 25 0 - 40 IU/L   ALT 27 0 - 32 IU/L  Lipid Panel w/o Chol/HDL Ratio  Result Value Ref Range   Cholesterol, Total 256 (H) 100 - 199 mg/dL   Triglycerides 420 (H) 0 - 149 mg/dL   HDL 38 (L) >39 mg/dL   VLDL Cholesterol Cal Comment 5 - 40 mg/dL   LDL Calculated Comment 0 - 99 mg/dL  HgB A1c  Result Value Ref Range   Hgb A1c MFr Bld 11.1 (H) 4.8 - 5.6 %   Est. average glucose  Bld gHb Est-mCnc 272 mg/dL       Assessment & Plan:   Problem List Items Addressed This Visit      Other   Depression - Primary    Will switch from effexor to buspar given worsening sxs. Recheck in 1 month      Relevant Medications   busPIRone (BUSPAR) 10 MG tablet    Other Visit Diagnoses    Anxiety       New onset, will switch from effexor to buspar BID and monitor for benefit. Will check labs including TSH at upcoming CPE   Relevant Medications   busPIRone (BUSPAR) 10 MG tablet       Follow up plan: Return in about 4 weeks (around 09/23/2018) for CPE, mood f/u.

## 2018-09-10 ENCOUNTER — Other Ambulatory Visit: Payer: Self-pay | Admitting: Family Medicine

## 2018-09-10 NOTE — Telephone Encounter (Signed)
Requested Prescriptions  Pending Prescriptions Disp Refills  . amLODipine (NORVASC) 5 MG tablet [Pharmacy Med Name: amLODIPine Besylate 5 MG Oral Tablet] 30 tablet 0    Sig: Take 1 tablet by mouth once daily     Cardiovascular:  Calcium Channel Blockers Passed - 09/10/2018 10:45 AM      Passed - Last BP in normal range    BP Readings from Last 1 Encounters:  04/12/18 116/78         Passed - Valid encounter within last 6 months    Recent Outpatient Visits          2 weeks ago Mild episode of recurrent major depressive disorder [facility])   [facility] Volney American, [place]   5 months ago [facility], [place], [place]   11 months ago Type 2 diabetes mellitus with chronic kidney disease, without long-term current use of insulin, unspecified CKD stage [facility])   [facility] American, [place]   1 year ago Mild episode of recurrent major depressive disorder [facility])   [place], [place], [place]   2 years ago Upper respiratory tract infection, unspecified type   [facility], Lilia Argue, [place]      Future Appointments            In 2 weeks [facility]ct[facility], Lilia Argue, [place], [facility]           . losartan (COZAAR) 50 MG tablet [Pharmacy Med Name: Losartan Potassium 50 MG Oral Tablet] 30 tablet 0    Sig: Take 1 tablet by mouth once daily     Cardiovascular:  Angiotensin Receptor Blockers Failed - 09/10/2018 10:45 AM      Failed - Cr in normal range and within 180 days    Creatinine, Ser  Date Value Ref Range Status  10/14/2017 0.71 0.57 - 1.00 mg/dL Final         Failed - K in normal range and within 180 days    Potassium  Date Value Ref Range Status  10/14/2017 4.2 3.5 - 5.2 mmol/L Final         Passed - Patient is not pregnant      Passed - Last BP in normal range    BP Readings from Last 1 Encounters:  04/12/18 116/78         Passed - Valid  encounter within last 6 months    Recent Outpatient Visits          2 weeks ago Mild episode of recurrent major depressive disorder [facility])   [facility] Vol[place] American, [place]-C   5 months ago [facility], [place], [place]   11 months ago Type 2 diabetes mellitus with chronic kidney disease, without long-term current use of insulin, unspecified CKD stage [facility])   [facility] Volney American, [place]   1 year ago Mild episode of recurrent major depressive disorder [facility])   [facility] Volney American, [place]   2 years ago Upper respiratory tract infection, unspecified type   [facility], [place]ilia Argue, [place]      Future Appointments            In 2 weeks [facility] Desanctis, Lilia Argue, [place], [place]

## 2018-09-20 ENCOUNTER — Ambulatory Visit (INDEPENDENT_AMBULATORY_CARE_PROVIDER_SITE_OTHER): Payer: 59 | Admitting: Family Medicine

## 2018-09-20 ENCOUNTER — Other Ambulatory Visit: Payer: Self-pay

## 2018-09-20 ENCOUNTER — Encounter: Payer: Self-pay | Admitting: Family Medicine

## 2018-09-20 DIAGNOSIS — R590 Localized enlarged lymph nodes: Secondary | ICD-10-CM

## 2018-09-20 MED ORDER — AMOXICILLIN-POT CLAVULANATE 875-125 MG PO TABS: 1.0000 | ORAL_TABLET | Freq: Two times a day (BID) | ORAL | 0 refills | Status: DC

## 2018-09-20 NOTE — Progress Notes (Signed)
LMP  (LMP Unknown)    Subjective:    Patient ID: Holly Norman, female    DOB: [birth year], 48 y.o.   MRN: [id number]  HPI: Holly Norman is a 48 y.o. female  Chief Complaint  Patient presents with  . throat pain    Pain in left side of neck, some swelling under jaw, hurts with swallowing, yawning and touching it    . This visit was completed via WebEx due to the restrictions of the COVID-19 pandemic. All issues as above were discussed and addressed. Physical exam was done as above through visual confirmation on WebEx. If it was felt that the patient should be evaluated in the office, they were directed there. The patient verbally consented to this visit. . Location of the patient: home . Location of the provider: home . Those involved with this call:  . Provider: Merrie Roof, PA-C . CMA: Tiffany Reel, CMA . Front Desk/Registration: Jill Side  . Time spent on call: 15 minutes with patient face to face via video conference. More than 50% of this time was spent in counseling and coordination of care. 5 minutes total spent in review of patient's record and preparation of their chart. I verified patient identity using two factors (patient name and date of birth). Patient consents verbally to being seen via telemedicine visit today.   About 4-5 days of swollen lymph node on left side of neck, painful with swallowing, yawning, chewing. Denies significantly sore throat, dysphagia, fevers, chills, sweats, headache, sick contacts, known dental issues. Trying OTC pain relievers with mild temporary relief.   Relevant past medical, surgical, family and social history reviewed and updated as indicated. Interim medical history since our last visit reviewed. Allergies and medications reviewed and updated.  Review of Systems  Per HPI unless specifically indicated above     Objective:    LMP  (LMP Unknown)   Wt Readings from Last 3 Encounters:  04/12/18 221 lb (100.2 kg)  10/14/17 223  lb 6.4 oz (101.3 kg)  12/22/16 229 lb (103.9 kg)    Physical Exam Vitals signs and nursing note reviewed.  Constitutional:      General: She is not in acute distress.    Appearance: Normal appearance.  HENT:     Head: Atraumatic.     Right Ear: External ear normal.     Left Ear: External ear normal.     Nose: Nose normal. No congestion.     Mouth/Throat:     Mouth: Mucous membranes are moist.     Pharynx: Oropharynx is clear. No posterior oropharyngeal erythema.  Eyes:     Extraocular Movements: Extraocular movements intact.     Conjunctiva/sclera: Conjunctivae normal.  Neck:     Musculoskeletal: Normal range of motion.     Comments: Area of concern not appreciable via video visit screen Cardiovascular:     Comments: Unable to assess via virtual visit Pulmonary:     Effort: Pulmonary effort is normal. No respiratory distress.  Musculoskeletal: Normal range of motion.  Skin:    General: Skin is dry.     Findings: No erythema.  Neurological:     Mental Status: She is alert and oriented to person, place, and time.  Psychiatric:        Mood and Affect: Mood normal.        Thought Content: Thought content normal.        Judgment: Judgment normal.     Results for orders placed or performed  in visit on 10/14/17  CBC with Differential/Platelet  Result Value Ref Range   WBC 6.8 3.4 - 10.8 x10E3/uL   RBC 4.16 3.77 - 5.28 x10E6/uL   Hemoglobin 13.5 11.1 - 15.9 g/dL   Hematocrit 39.4 34.0 - 46.6 %   MCV 95 79 - 97 fL   MCH 32.5 26.6 - 33.0 pg   MCHC 34.3 31.5 - 35.7 g/dL   RDW 14.3 12.3 - 15.4 %   Platelets 247 150 - 450 x10E3/uL   Neutrophils 63 Not Estab. %   Lymphs 28 Not Estab. %   Monocytes 6 Not Estab. %   Eos 3 Not Estab. %   Basos 0 Not Estab. %   Neutrophils Absolute 4.2 1.4 - 7.0 x10E3/uL   Lymphocytes Absolute 1.9 0.7 - 3.1 x10E3/uL   Monocytes Absolute 0.4 0.1 - 0.9 x10E3/uL   EOS (ABSOLUTE) 0.2 0.0 - 0.4 x10E3/uL   Basophils Absolute 0.0 0.0 - 0.2 x10E3/uL    Immature Granulocytes 0 Not Estab. %   Immature Grans (Abs) 0.0 0.0 - 0.1 x10E3/uL  Comprehensive metabolic panel  Result Value Ref Range   Glucose 217 (H) 65 - 99 mg/dL   BUN 12 6 - 24 mg/dL   Creatinine, Ser 0.71 0.57 - 1.00 mg/dL   GFR calc non Af Amer 102 >59 mL/min/1.73   GFR calc Af Amer 117 >59 mL/min/1.73   BUN/Creatinine Ratio 17 9 - 23   Sodium 135 134 - 144 mmol/L   Potassium 4.2 3.5 - 5.2 mmol/L   Chloride 93 (L) 96 - 106 mmol/L   CO2 22 20 - 29 mmol/L   Calcium 9.5 8.7 - 10.2 mg/dL   Total Protein 6.7 6.0 - 8.5 g/dL   Albumin 4.2 3.5 - 5.5 g/dL   Globulin, Total 2.5 1.5 - 4.5 g/dL   Albumin/Globulin Ratio 1.7 1.2 - 2.2   Bilirubin Total 0.7 0.0 - 1.2 mg/dL   Alkaline Phosphatase 87 39 - 117 IU/L   AST 25 0 - 40 IU/L   ALT 27 0 - 32 IU/L  Lipid Panel w/o Chol/HDL Ratio  Result Value Ref Range   Cholesterol, Total 256 (H) 100 - 199 mg/dL   Triglycerides 420 (H) 0 - 149 mg/dL   HDL 38 (L) >39 mg/dL   VLDL Cholesterol Cal Comment 5 - 40 mg/dL   LDL Calculated Comment 0 - 99 mg/dL  HgB A1c  Result Value Ref Range   Hgb A1c MFr Bld 11.1 (H) 4.8 - 5.6 %   Est. average glucose Bld gHb Est-mCnc 272 mg/dL      Assessment & Plan:   Problem List Items Addressed This Visit    None    Visit Diagnoses    Cervical lymphadenopathy    -  Primary   Inflammatory vs bacterial, tx with warm compresses, OTC anti-inflammatories, and abx sent in case infectious cause. F/u in clinic if not improving       Follow up plan: Return for as scheduled next week.

## 2018-09-27 ENCOUNTER — Encounter: Payer: Self-pay | Admitting: Family Medicine

## 2018-10-04 ENCOUNTER — Other Ambulatory Visit: Payer: Self-pay | Admitting: Family Medicine

## 2018-10-04 ENCOUNTER — Other Ambulatory Visit: Payer: Self-pay | Admitting: Nurse Practitioner

## 2018-10-04 NOTE — Telephone Encounter (Signed)
Requested Prescriptions  Pending Prescriptions Disp Refills  . losartan (COZAAR) 50 MG tablet [Pharmacy Med Name: Losartan Potassium 50 MG Oral Tablet] 90 tablet 0    Sig: Take 1 tablet by mouth once daily     Cardiovascular:  Angiotensin Receptor Blockers Failed - 10/04/2018 11:39 AM      Failed - Cr in normal range and within 180 days    Creatinine, Ser  Date Value Ref Range Status  10/14/2017 0.71 0.57 - 1.00 mg/dL Final         Failed - K in normal range and within 180 days    Potassium  Date Value Ref Range Status  10/14/2017 4.2 3.5 - 5.2 mmol/L Final         Passed - Patient is not pregnant      Passed - Last BP in normal range    BP Readings from Last 1 Encounters:  04/12/18 116/78         Passed - Valid encounter within last 6 months    Recent Outpatient Visits          2 weeks ago Cervical lymphadenopathy   [facility] Volney American, PA-C   1 month ago Mild episode of recurrent major depressive disorder [facility])   [place], [place], [place]   5 months ago [facility], [place], [place]   11 months ago Type 2 diabetes mellitus with chronic kidney disease, without long-term current use of insulin, unspecified CKD stage [facility])   [place], [place], [place]   1 year ago Mild episode of recurrent major depressive disorder [facility])   [place], [place], [place]      Future Appointments            In 1 month Orene Desanctis, Lilia Argue, PA-C [place], [facility]           . amLODipine ([place]) 5 MG tablet [Pharmacy Med Name: amLODIPine Besylate 5 MG Oral Tablet] 90 tablet 0    Sig: Take 1 tablet by mouth once daily     Cardiovascular:  Calcium Channel Blockers Passed - 10/04/2018 11:39 AM      Passed - Last BP in normal range    BP Readings from Last 1 Encounters:  04/12/18 116/78         Passed - Valid encounter within last 6  months    Recent Outpatient Visits          2 weeks ago Cervical lymphadenopathy   [facility] Volney American, PA-C   1 month ago Mild episode of recurrent major depressive disorder [facility]   [facility] Volney American, [place]   5 months ago [facility], [place], [place]   11 months ago Type 2 diabetes mellitus with chronic kidney disease, without long-term current use of insulin, unspecified CKD stage [facility])   [facility] Volney American, [place]   1 year ago Mild episode of recurrent major depressive disorder [facility])   [place], Lilia Argue, [place]      Future Appointments            In 1 month Orene Desanctis, Lilia Argue, [place], [facility]

## 2018-10-04 NOTE — Telephone Encounter (Signed)
Requested Prescriptions  Signed Prescriptions Disp Refills   busPIRone (BUSPAR) 10 MG tablet 120 tablet 0    Sig: TAKE 1 TABLET BY MOUTH THREE TIMES DAILY     Psychiatry: Anxiolytics/Hypnotics - Non-controlled Passed - 10/04/2018 11:39 AM      Passed - Valid encounter within last 6 months    Recent Outpatient Visits          2 weeks ago Cervical lymphadenopathy   [facility] Volney American, [place]   1 month ago Mild episode of recurrent major depressive disorder [facility])   [facility] Volney American, [place]   5 months ago [facility], [place], [place]   11 months ago Type 2 diabetes mellitus with chronic kidney disease, without long-term current use of insulin, unspecified CKD stage [facility])   [facility] Volney American, [place]   1 year ago Mild episode of recurrent major depressive disorder [facility])   [place], [place], [place]      Future Appointments            In 1 month Orene Desanctis, Lilia Argue, PA-C [facility], [facility]

## 2018-10-25 ENCOUNTER — Ambulatory Visit
Admission: RE | Admit: 2018-10-25 | Discharge: 2018-10-25 | Disposition: A | Discharge status: Home / Self Care | Payer: 59 | Source: Ambulatory Visit | Attending: Family Medicine | Admitting: Family Medicine

## 2018-10-25 ENCOUNTER — Other Ambulatory Visit: Payer: Self-pay

## 2018-10-25 DIAGNOSIS — R922 Inconclusive mammogram: Secondary | ICD-10-CM | POA: Diagnosis not present

## 2018-10-25 DIAGNOSIS — N6310 Unspecified lump in the right breast, unspecified quadrant: Secondary | ICD-10-CM | POA: Diagnosis not present

## 2018-10-31 ENCOUNTER — Other Ambulatory Visit: Payer: Self-pay | Admitting: Family Medicine

## 2018-11-10 ENCOUNTER — Encounter: Payer: Self-pay | Admitting: Family Medicine

## 2018-11-18 ENCOUNTER — Other Ambulatory Visit: Payer: Self-pay | Admitting: Family Medicine

## 2018-11-18 NOTE — Telephone Encounter (Signed)
Upcoming appointment 12/16/18.

## 2018-11-18 NOTE — Telephone Encounter (Signed)
Requested medication (s) are due for refill today: yes  Requested medication (s) are on the active medication list: yes  Last refill:  10/04/2018  Future visit scheduled: yes  Notes to clinic:  Review for refill   Requested Prescriptions  Pending Prescriptions Disp Refills   atenolol-chlorthalidone ([place]) 100-25 MG tablet [Pharmacy Med Name: Atenolol-Chlorthalidone 100-25 MG Oral Tablet] 30 tablet 0    Sig: Take 1 tablet by mouth once daily     Cardiovascular: Beta Blocker + Diuretic Combos Failed - 11/18/2018 11:23 AM      Failed - K in normal range and within 180 days    Potassium  Date Value Ref Range Status  10/14/2017 4.2 3.5 - 5.2 mmol/L Final         Failed - Na in normal range and within 180 days    Sodium  Date Value Ref Range Status  10/14/2017 135 134 - 144 mmol/L Final         Failed - Cr in normal range and within 180 days    Creatinine, Ser  Date Value Ref Range Status  10/14/2017 0.71 0.57 - 1.00 mg/dL Final         Failed - Ca in normal range and within 180 days    Calcium  Date Value Ref Range Status  10/14/2017 9.5 8.7 - 10.2 mg/dL Final         Passed - Patient is not pregnant      Passed - Last BP in normal range    BP Readings from Last 1 Encounters:  04/12/18 116/78         Passed - Last Heart Rate in normal range    Pulse Readings from Last 1 Encounters:  04/12/18 85         Passed - Valid encounter within last 6 months    Recent Outpatient Visits          1 month ago Cervical lymphadenopathy   [facility] Volney American, PA-C   2 months ago Mild episode of recurrent major depressive disorder [facility])   [place], [place], [place]   7 months ago [place], [place], [place]   1 year ago Type 2 diabetes mellitus with chronic kidney disease, without long-term current use of insulin, unspecified CKD stage [facility])   [facility] Volney American, [place]   1 year ago Mild episode of recurrent major depressive disorder [facility])   [place], [place], [place]      Future Appointments            In 4 weeks Orene Desanctis, Lilia Argue, PA-C [place], [facility]            atorvastatin (LIPITOR) 40 MG tablet [Pharmacy Med Name: Atorvastatin Calcium 40 MG Oral Tablet] 30 tablet 0    Sig: Take 1 tablet by mouth once daily     Cardiovascular:  Antilipid - Statins Failed - 11/18/2018 11:23 AM      Failed - Total Cholesterol in normal range and within 360 days    Cholesterol, Total  Date Value Ref Range Status  10/14/2017 256 (H) 100 - 199 mg/dL Final   Cholesterol Piccolo, Waived  Date Value Ref Range Status  06/11/2015 WILL FOLLOW  Preliminary         Failed - LDL in normal range and within 360 days    LDL Calculated  Date Value Ref Range Status  10/14/2017 Comment 0 - 99 mg/dL Final    Comment:    Triglyceride result indicated is too high for an accurate LDL cholesterol estimation.          Failed - HDL in normal range and within 360 days    HDL  Date Value Ref Range Status  10/14/2017 38 (L) >39 mg/dL Final         Failed - Triglycerides in normal range and within 360 days    Triglycerides  Date Value Ref Range Status  10/14/2017 420 (H) 0 - 149 mg/dL Final   Triglycerides Piccolo,Waived  Date Value Ref Range Status  06/11/2015 WILL FOLLOW  Preliminary         Passed - Patient is not pregnant      Passed - Valid encounter within last 12 months    Recent Outpatient Visits          1 month ago Cervical lymphadenopathy   [facility] Volney American, [place]   2 months ago Mild episode of recurrent major depressive disorder [facility])   [facility] Volney [facility], [place]   7 months ago [facility], [place], [place]   1 year ago Type 2 diabetes mellitus with chronic kidney disease, without long-term current use  of insulin, unspecified CKD stage [facility])   [facility] Volney American, [place]   1 year ago Mild episode of recurrent major depressive disorder [facility])   [place], [place], [place]      Future Appointments            In 4 weeks Orene Desanctis, Lilia Argue, [place], [facility]

## 2018-12-16 ENCOUNTER — Encounter: Payer: Self-pay | Admitting: Family Medicine

## 2018-12-16 ENCOUNTER — Other Ambulatory Visit: Payer: Self-pay

## 2018-12-16 ENCOUNTER — Ambulatory Visit (INDEPENDENT_AMBULATORY_CARE_PROVIDER_SITE_OTHER): Payer: 59 | Admitting: Family Medicine

## 2018-12-16 VITALS — BP 97/61 | HR 78 | Temp 98.9°F | Ht 62.0 in | Wt 214.6 lb

## 2018-12-16 DIAGNOSIS — R69 Illness, unspecified: Secondary | ICD-10-CM | POA: Diagnosis not present

## 2018-12-16 DIAGNOSIS — E782 Mixed hyperlipidemia: Secondary | ICD-10-CM | POA: Diagnosis not present

## 2018-12-16 DIAGNOSIS — I129 Hypertensive chronic kidney disease with stage 1 through stage 4 chronic kidney disease, or unspecified chronic kidney disease: Secondary | ICD-10-CM | POA: Diagnosis not present

## 2018-12-16 DIAGNOSIS — F330 Major depressive disorder, recurrent, mild: Secondary | ICD-10-CM | POA: Diagnosis not present

## 2018-12-16 DIAGNOSIS — G4700 Insomnia, unspecified: Secondary | ICD-10-CM | POA: Diagnosis not present

## 2018-12-16 DIAGNOSIS — Z0000 Encounter for general adult medical examination without abnormal findings: Secondary | ICD-10-CM | POA: Diagnosis not present

## 2018-12-16 DIAGNOSIS — E1122 Type 2 diabetes mellitus with diabetic chronic kidney disease: Secondary | ICD-10-CM | POA: Diagnosis not present

## 2018-12-16 LAB — UA/M W/RFLX CULTURE, ROUTINE
Bilirubin, UA: NEGATIVE
Ketones, UA: NEGATIVE
Leukocytes,UA: NEGATIVE
Nitrite, UA: NEGATIVE
Protein,UA: NEGATIVE
RBC, UA: NEGATIVE
Specific Gravity, UA: 1.01 (ref 1.005–1.030)
Urobilinogen, Ur: 0.2 mg/dL (ref 0.2–1.0)
pH, UA: 5.5 (ref 5.0–7.5)

## 2018-12-16 MED ORDER — SERTRALINE HCL 50 MG PO TABS: 50.0000 mg | ORAL_TABLET | Freq: Every day | ORAL | 0 refills | Status: DC

## 2018-12-16 MED ORDER — OZEMPIC (0.25 OR 0.5 MG/DOSE) 2 MG/1.5ML ~~LOC~~ SOPN: 0.2500 mg | PEN_INJECTOR | SUBCUTANEOUS | 0 refills | Status: DC

## 2018-12-16 NOTE — Progress Notes (Signed)
BP 97/61   Pulse 78   Temp 98.9 F (37.2 C) (Oral)   Ht 5\' 2"  (1.575 m)   Wt 214 lb 9.6 oz (97.3 kg)   LMP  (LMP Unknown)   SpO2 97%   BMI 39.25 kg/m    Subjective:    Patient ID: Holly Norman, female    DOB: 1971/03/03, 48 y.o.   MRN: [id number]  HPI: Holly Norman is a 48 y.o. female presenting on 12/16/2018 for comprehensive medical examination. Current medical complaints include:see below  Has been lightheaded often, especially when standing up. BPs when she checks at work have  Been low, 90-100/60s. Taking her typical BP regimen as prescribed. Denies CP, SOB, HAs, syncope. Drinks plenty of water.   DM - BSs at home are over 200. Tends to forget to take her metformin often. Eye exam scheduled for next month. Denies low blood sugar spells. Taking jardiance faithfully. Trying to eat better and exercise, has lost some weight recently.   Having trouble falling asleep which isn't new, now having trouble staying asleep. This has been worsening lately. Taking melatonin which does help with falling asleep. Sometimes takes tylenol PM   Buspar helping quite a bit with her anxiety but struggling with the frequent dosing. Denies side effects, SI/HI.   She currently lives with: Menopausal Symptoms: no  Depression Screen done today and results listed below:  Depression screen [facility] 2/9 12/16/2018 08/26/2018 10/14/2017 12/22/2016 02/19/2016  Decreased Interest 0 1 0 0 0  Down, Depressed, Hopeless 0 1 0 0 0  PHQ - 2 Score 0 2 0 0 0  Altered sleeping 2 3 3 2  -  Tired, decreased energy 0 3 1 0 -  Change in appetite 0 0 0 0 -  Feeling bad or failure about yourself  0 0 0 0 -  Trouble concentrating 1 3 0 2 -  Moving slowly or fidgety/restless 0 0 0 2 -  Suicidal thoughts 0 0 0 0 -  PHQ-9 Score 3 11 4 6  -  Difficult doing work/chores Not difficult at all - - Not difficult at all -    The patient does not have a history of falls. I did complete a risk assessment for falls. A plan of care  for falls was documented.   Past Medical History:  Past Medical History:  Diagnosis Date  . Diabetes mellitus without complication ([place])   . Hyperlipidemia   . Hypertension     Surgical History:  Past Surgical History:  Procedure Laterality Date  . ABDOMINAL HYSTERECTOMY    . CARDIAC ELECTROPHYSIOLOGY STUDY AND ABLATION    . CESAREAN SECTION    . rt ankle surgery      Medications:  Current Outpatient Medications on File Prior to Visit  Medication Sig  . JARDIANCE 10 MG TABS tablet Take 1 tablet by mouth once daily  . losartan (COZAAR) 50 MG tablet Take 1 tablet by mouth once daily  . meclizine (ANTIVERT) 25 MG tablet Take 1 tablet (25 mg total) by mouth 3 (three) times daily as needed for dizziness.  . Omega-3 Fatty Acids (FISH OIL) 1000 MG CAPS Take 1,000 mg by mouth daily.   No current facility-administered medications on file prior to visit.     Allergies:  Allergies  Allergen Reactions  . Ivp Dye [Iodinated Diagnostic Agents] Shortness Of Breath    Social History:  Social History   Socioeconomic History  . Marital status: Married    Spouse name: Not  on file  . Number of children: Not on file  . Years of education: Not on file  . Highest education level: Not on file  Occupational History  . Not on file  Social Needs  . Financial resource strain: Not on file  . Food insecurity    Worry: Not on file    Inability: Not on file  . Transportation needs    Medical: Not on file    Non-medical: Not on file  Tobacco Use  . Smoking status: Never Smoker  . Smokeless tobacco: Never Used  Substance and Sexual Activity  . Alcohol use: No    Alcohol/week: 0.0 standard drinks  . Drug use: No  . Sexual activity: Yes  Lifestyle  . Physical activity    Days per week: Not on file    Minutes per session: Not on file  . Stress: Not on file  Relationships  . Social Herbalist on phone: Not on file    Gets together: Not on file    Attends religious  service: Not on file    Active member of club or organization: Not on file    Attends meetings of clubs or organizations: Not on file    Relationship status: Not on file  . Intimate partner violence    Fear of current or ex partner: Not on file    Emotionally abused: Not on file    Physically abused: Not on file    Forced sexual activity: Not on file  Other Topics Concern  . Not on file  Social History Narrative  . Not on file   Social History   Tobacco Use  Smoking Status Never Smoker  Smokeless Tobacco Never Used   Social History   Substance and Sexual Activity  Alcohol Use No  . Alcohol/week: 0.0 standard drinks    Family History:  Family History  Problem Relation Age of Onset  . Diabetes Mother   . Heart disease Mother   . Hypertension Mother   . Heart disease Father 54  . Diabetes Father   . Hypertension Father   . Stroke Father   . Cancer Maternal Grandmother        breast and colon  . Breast cancer Maternal Grandmother 59  . Heart disease Maternal Grandfather   . Heart disease Paternal Grandfather     Past medical history, surgical history, medications, allergies, family history and social history reviewed with patient today and changes made to appropriate areas of the chart.   Review of Systems - General ROS: negative Psychological ROS: negative Ophthalmic ROS: negative ENT ROS: negative Allergy and Immunology ROS: negative Hematological and Lymphatic ROS: negative Endocrine ROS: negative Breast ROS: negative for breast lumps Respiratory ROS: no cough, shortness of breath, or wheezing Cardiovascular ROS: no chest pain or dyspnea on exertion Gastrointestinal ROS: no abdominal pain, change in bowel habits, or black or bloody stools Genito-Urinary ROS: no dysuria, trouble voiding, or hematuria Musculoskeletal ROS: negative Neurological ROS: positive for - dizziness Dermatological ROS: negative All other ROS negative except what is listed above and in  the HPI.      Objective:    BP 97/61   Pulse 78   Temp 98.9 F (37.2 C) (Oral)   Ht 5\' 2"  (1.575 m)   Wt 214 lb 9.6 oz (97.3 kg)   LMP  (LMP Unknown)   SpO2 97%   BMI 39.25 kg/m   Wt Readings from Last 3 Encounters:  12/16/18 214 lb  9.6 oz (97.3 kg)  04/12/18 221 lb (100.2 kg)  10/14/17 223 lb 6.4 oz (101.3 kg)    Physical Exam Vitals signs and nursing note reviewed.  Constitutional:      General: She is not in acute distress.    Appearance: She is well-developed.  HENT:     Head: Atraumatic.     Right Ear: External ear normal.     Left Ear: External ear normal.     Nose: Nose normal.     Mouth/Throat:     Pharynx: No oropharyngeal exudate.  Eyes:     General: No scleral icterus.    Conjunctiva/sclera: Conjunctivae normal.     Pupils: Pupils are equal, round, and reactive to light.  Neck:     Musculoskeletal: Normal range of motion and neck supple.     Thyroid: No thyromegaly.  Cardiovascular:     Rate and Rhythm: Normal rate and regular rhythm.     Heart sounds: Normal heart sounds.  Pulmonary:     Effort: Pulmonary effort is normal. No respiratory distress.     Breath sounds: Normal breath sounds.  Chest:     Breasts:        Right: No mass, skin change or tenderness.        Left: No mass, skin change or tenderness.  Abdominal:     General: Bowel sounds are normal.     Palpations: Abdomen is soft. There is no mass.     Tenderness: There is no abdominal tenderness.  Genitourinary:    Comments: GU exam declined Musculoskeletal: Normal range of motion.        General: No tenderness.  Lymphadenopathy:     Cervical: No cervical adenopathy.     Upper Body:     Right upper body: No axillary adenopathy.     Left upper body: No axillary adenopathy.  Skin:    General: Skin is warm and dry.     Findings: No rash.  Neurological:     Mental Status: She is alert and oriented to person, place, and time.     Cranial Nerves: No cranial nerve deficit.  Psychiatric:         Behavior: Behavior normal.     Results for orders placed or performed in visit on 12/16/18  CBC with Differential/Platelet  Result Value Ref Range   WBC 6.7 3.4 - 10.8 x10E3/uL   RBC 4.59 3.77 - 5.28 x10E6/uL   Hemoglobin 14.3 11.1 - 15.9 g/dL   Hematocrit 41.6 34.0 - 46.6 %   MCV 91 79 - 97 fL   MCH 31.2 26.6 - 33.0 pg   MCHC 34.4 31.5 - 35.7 g/dL   RDW 13.0 11.7 - 15.4 %   Platelets 225 150 - 450 x10E3/uL   Neutrophils 68 Not Estab. %   Lymphs 20 Not Estab. %   Monocytes 7 Not Estab. %   Eos 4 Not Estab. %   Basos 1 Not Estab. %   Neutrophils Absolute 4.6 1.4 - 7.0 x10E3/uL   Lymphocytes Absolute 1.4 0.7 - 3.1 x10E3/uL   Monocytes Absolute 0.5 0.1 - 0.9 x10E3/uL   EOS (ABSOLUTE) 0.2 0.0 - 0.4 x10E3/uL   Basophils Absolute 0.0 0.0 - 0.2 x10E3/uL   Immature Granulocytes 0 Not Estab. %   Immature Grans (Abs) 0.0 0.0 - 0.1 x10E3/uL  Comprehensive metabolic panel  Result Value Ref Range   Glucose 282 (H) 65 - 99 mg/dL   BUN 20 6 - 24 mg/dL  Creatinine, Ser 0.91 0.57 - 1.00 mg/dL   GFR calc non Af Amer 75 >59 mL/min/1.73   GFR calc Af Amer 86 >59 mL/min/1.73   BUN/Creatinine Ratio 22 9 - 23   Sodium 138 134 - 144 mmol/L   Potassium 4.2 3.5 - 5.2 mmol/L   Chloride 95 (L) 96 - 106 mmol/L   CO2 26 20 - 29 mmol/L   Calcium 10.1 8.7 - 10.2 mg/dL   Total Protein 6.9 6.0 - 8.5 g/dL   Albumin 4.4 3.8 - 4.8 g/dL   Globulin, Total 2.5 1.5 - 4.5 g/dL   Albumin/Globulin Ratio 1.8 1.2 - 2.2   Bilirubin Total 1.4 (H) 0.0 - 1.2 mg/dL   Alkaline Phosphatase 88 39 - 117 IU/L   AST 19 0 - 40 IU/L   ALT 23 0 - 32 IU/L  Lipid Panel w/o Chol/HDL Ratio  Result Value Ref Range   Cholesterol, Total 201 (H) 100 - 199 mg/dL   Triglycerides 647 (HH) 0 - 149 mg/dL   HDL 30 (L) >39 mg/dL   VLDL Cholesterol Cal 101 (H) 5 - 40 mg/dL   LDL Chol Calc (NIH) 70 0 - 99 mg/dL  TSH  Result Value Ref Range   TSH 1.830 0.450 - 4.500 uIU/mL  UA/M w/rflx Culture, Routine   Specimen: Urine    URINE  Result Value Ref Range   Specific Gravity, UA 1.010 1.005 - 1.030   pH, UA 5.5 5.0 - 7.5   Color, UA Yellow Yellow   Appearance Ur Hazy (A) Clear   Leukocytes,UA Negative Negative   Protein,UA Negative Negative/Trace   Glucose, UA 3+ (A) Negative   Ketones, UA Negative Negative   RBC, UA Negative Negative   Bilirubin, UA Negative Negative   Urobilinogen, Ur 0.2 0.2 - 1.0 mg/dL   Nitrite, UA Negative Negative  HgB A1c  Result Value Ref Range   Hgb A1c MFr Bld 8.6 (H) 4.8 - 5.6 %   Est. average glucose Bld gHb Est-mCnc 200 mg/dL      Assessment & Plan:   Problem List Items Addressed This Visit      Endocrine   Diabetes ([place])    Recheck A1C, d/c metformin due to inconsistent adherence. Will continue jardiance and add ozempic for easier dosing and possible some assistance with her weight loss journey. Continue good lifestyle modifications      Relevant Medications   Semaglutide,0.25 or 0.5MG /DOS, (OZEMPIC, 0.25 OR 0.5 MG/DOSE,) 2 MG/1.5ML SOPN   Other Relevant Orders   HgB A1c (Completed)     Genitourinary   Hypertensive CKD (chronic kidney disease)    BPs low lately, with some orthostatic sxs at times. Will d/c amlodipine, continue remainder of regimen. Call if getting abnormal readings during checks at work prior to f/u.       Relevant Orders   CBC with Differential/Platelet (Completed)   Comprehensive metabolic panel (Completed)   TSH (Completed)   UA/M w/rflx Culture, Routine (Completed)     Other   Hyperlipidemia    Recheck lipids, adjust as needed. Continue working on lifestyle modifications      Relevant Orders   Lipid Panel w/o Chol/HDL Ratio (Completed)   Depression - Primary    Will try switching to zoloft to reduce dosing. Has been having significant benefit from buspar, will monitor for equivalent improvement with zoloft      Relevant Medications   sertraline (ZOLOFT) 50 MG tablet    Other Visit Diagnoses    Annual physical  exam        Insomnia, unspecified type       Continue melatonin, can try unisom and improving sleep hygiene       Follow up plan: Return in about 4 weeks (around 01/13/2019) for Anxiety, BP, DM recheck.   LABORATORY TESTING:  - Pap smear: not applicable  IMMUNIZATIONS:   - Tdap: Tetanus vaccination status reviewed: last tetanus booster within 10 years. - Influenza: gets through work - Pneumovax: postponed - Prevnar: Not applicable - HPV: Not applicable - Zostavax vaccine: Not applicable  SCREENING: -Mammogram: Up to date   PATIENT COUNSELING:   Advised to take 1 mg of folate supplement per day if capable of pregnancy.   Sexuality: Discussed sexually transmitted diseases, partner selection, use of condoms, avoidance of unintended pregnancy  and contraceptive alternatives.   Advised to avoid cigarette smoking.  I discussed with the patient that most people either abstain from alcohol or drink within safe limits (<=14/week and <=4 drinks/occasion for males, <=7/weeks and <= 3 drinks/occasion for females) and that the risk for alcohol disorders and other health effects rises proportionally with the number of drinks per week and how often a drinker exceeds daily limits.  Discussed cessation/primary prevention of drug use and availability of treatment for abuse.   Diet: Encouraged to adjust caloric intake to maintain  or achieve ideal body weight, to reduce intake of dietary saturated fat and total fat, to limit sodium intake by avoiding high sodium foods and not adding table salt, and to maintain adequate dietary potassium and calcium preferably from fresh fruits, vegetables, and low-fat dairy products.    stressed the importance of regular exercise  Injury prevention: Discussed safety belts, safety helmets, smoke detector, smoking near bedding or upholstery.   Dental health: Discussed importance of regular tooth brushing, flossing, and dental visits.    NEXT PREVENTATIVE PHYSICAL DUE IN 1  YEAR. Return in about 4 weeks (around 01/13/2019) for Anxiety, BP, DM recheck.

## 2018-12-17 LAB — CBC WITH DIFFERENTIAL/PLATELET
Basophils Absolute: 0 10*3/uL (ref 0.0–0.2)
Basos: 1 %
EOS (ABSOLUTE): 0.2 10*3/uL (ref 0.0–0.4)
Eos: 4 %
Hematocrit: 41.6 % (ref 34.0–46.6)
Hemoglobin: 14.3 g/dL (ref 11.1–15.9)
Immature Grans (Abs): 0 10*3/uL (ref 0.0–0.1)
Immature Granulocytes: 0 %
Lymphocytes Absolute: 1.4 10*3/uL (ref 0.7–3.1)
Lymphs: 20 %
MCH: 31.2 pg (ref 26.6–33.0)
MCHC: 34.4 g/dL (ref 31.5–35.7)
MCV: 91 fL (ref 79–97)
Monocytes Absolute: 0.5 10*3/uL (ref 0.1–0.9)
Monocytes: 7 %
Neutrophils Absolute: 4.6 10*3/uL (ref 1.4–7.0)
Neutrophils: 68 %
Platelets: 225 10*3/uL (ref 150–450)
RBC: 4.59 x10E6/uL (ref 3.77–5.28)
RDW: 13 % (ref 11.7–15.4)
WBC: 6.7 10*3/uL (ref 3.4–10.8)

## 2018-12-17 LAB — COMPREHENSIVE METABOLIC PANEL
ALT: 23 IU/L (ref 0–32)
AST: 19 IU/L (ref 0–40)
Albumin/Globulin Ratio: 1.8 (ref 1.2–2.2)
Albumin: 4.4 g/dL (ref 3.8–4.8)
Alkaline Phosphatase: 88 IU/L (ref 39–117)
BUN/Creatinine Ratio: 22 (ref 9–23)
BUN: 20 mg/dL (ref 6–24)
Bilirubin Total: 1.4 mg/dL — ABNORMAL HIGH (ref 0.0–1.2)
CO2: 26 mmol/L (ref 20–29)
Calcium: 10.1 mg/dL (ref 8.7–10.2)
Chloride: 95 mmol/L — ABNORMAL LOW (ref 96–106)
Creatinine, Ser: 0.91 mg/dL (ref 0.57–1.00)
GFR calc Af Amer: 86 mL/min/{1.73_m2} (ref >59)
GFR calc non Af Amer: 75 mL/min/{1.73_m2} (ref >59)
Globulin, Total: 2.5 g/dL (ref 1.5–4.5)
Glucose: 282 mg/dL — ABNORMAL HIGH (ref 65–99)
Potassium: 4.2 mmol/L (ref 3.5–5.2)
Sodium: 138 mmol/L (ref 134–144)
Total Protein: 6.9 g/dL (ref 6.0–8.5)

## 2018-12-17 LAB — LIPID PANEL W/O CHOL/HDL RATIO
Cholesterol, Total: 201 mg/dL — ABNORMAL HIGH (ref 100–199)
HDL: 30 mg/dL — ABNORMAL LOW (ref >39)
LDL Chol Calc (NIH): 70 mg/dL (ref 0–99)
Triglycerides: 647 mg/dL (ref 0–149)
VLDL Cholesterol Cal: 101 mg/dL — ABNORMAL HIGH (ref 5–40)

## 2018-12-17 LAB — TSH: TSH: 1.83 u[IU]/mL (ref 0.450–4.500)

## 2018-12-17 LAB — HEMOGLOBIN A1C
Est. average glucose Bld gHb Est-mCnc: 200 mg/dL
Hgb A1c MFr Bld: 8.6 % — ABNORMAL HIGH (ref 4.8–5.6)

## 2018-12-19 ENCOUNTER — Other Ambulatory Visit: Payer: Self-pay | Admitting: Family Medicine

## 2018-12-20 ENCOUNTER — Other Ambulatory Visit: Payer: Self-pay | Admitting: Family Medicine

## 2018-12-20 MED ORDER — FENOFIBRATE 54 MG PO TABS: 54.0000 mg | ORAL_TABLET | Freq: Every day | ORAL | 1 refills | Status: DC

## 2018-12-21 NOTE — Assessment & Plan Note (Signed)
Recheck lipids, adjust as needed. Continue working on lifestyle modifications 

## 2018-12-21 NOTE — Assessment & Plan Note (Signed)
BPs low lately, with some orthostatic sxs at times. Will d/c amlodipine, continue remainder of regimen. Call if getting abnormal readings during checks at work prior to f/u.

## 2018-12-21 NOTE — Assessment & Plan Note (Addendum)
Will try switching to zoloft to reduce dosing. Has been having significant benefit from buspar, will monitor for equivalent improvement with zoloft

## 2018-12-21 NOTE — Assessment & Plan Note (Signed)
Recheck A1C, d/c metformin due to inconsistent adherence. Will continue jardiance and add ozempic for easier dosing and possible some assistance with her weight loss journey. Continue good lifestyle modifications

## 2019-01-18 ENCOUNTER — Other Ambulatory Visit: Payer: Self-pay | Admitting: Family Medicine

## 2019-01-19 ENCOUNTER — Other Ambulatory Visit: Payer: Self-pay

## 2019-01-20 ENCOUNTER — Encounter: Payer: Self-pay | Admitting: Family Medicine

## 2019-01-20 ENCOUNTER — Other Ambulatory Visit: Payer: Self-pay

## 2019-01-20 ENCOUNTER — Ambulatory Visit (INDEPENDENT_AMBULATORY_CARE_PROVIDER_SITE_OTHER): Payer: 59 | Admitting: Family Medicine

## 2019-01-20 VITALS — BP 114/78 | HR 82 | Temp 98.4°F | Ht 62.0 in | Wt 211.0 lb

## 2019-01-20 DIAGNOSIS — E1122 Type 2 diabetes mellitus with diabetic chronic kidney disease: Secondary | ICD-10-CM

## 2019-01-20 MED ORDER — OZEMPIC (0.25 OR 0.5 MG/DOSE) 2 MG/1.5ML ~~LOC~~ SOPN: 0.5000 mg | PEN_INJECTOR | SUBCUTANEOUS | 0 refills | Status: DC

## 2019-01-20 NOTE — Progress Notes (Signed)
BP 114/78   Pulse 82   Temp 98.4 F (36.9 C) (Oral)   Ht 5\' 2"  (1.575 m)   Wt 211 lb (95.7 kg)   LMP  (LMP Unknown)   SpO2 96%   BMI 38.59 kg/m    Subjective:    Patient ID: Holly Norman, female    DOB: 28-Feb-1971, 48 y.o.   MRN: [id number]  HPI: NEELA BERKLAND is a 48 y.o. female  Chief Complaint  Patient presents with  . Diabetes   Patient here today for 1 month f/u DM after starting ozempic. Tolerating well so far, no noted side effects. Home sugars 120s - 150s fasting. Trying hard to watch diet and be more active. Denies low blood sugar spells.   Relevant past medical, surgical, family and social history reviewed and updated as indicated. Interim medical history since our last visit reviewed. Allergies and medications reviewed and updated.  Review of Systems  Per HPI unless specifically indicated above     Objective:    BP 114/78   Pulse 82   Temp 98.4 F (36.9 C) (Oral)   Ht 5\' 2"  (1.575 m)   Wt 211 lb (95.7 kg)   LMP  (LMP Unknown)   SpO2 96%   BMI 38.59 kg/m   Wt Readings from Last 3 Encounters:  01/20/19 211 lb (95.7 kg)  12/16/18 214 lb 9.6 oz (97.3 kg)  04/12/18 221 lb (100.2 kg)    Physical Exam Vitals signs and nursing note reviewed.  Constitutional:      Appearance: Normal appearance. She is not ill-appearing.  HENT:     Head: Atraumatic.  Eyes:     Extraocular Movements: Extraocular movements intact.     Conjunctiva/sclera: Conjunctivae normal.  Neck:     Musculoskeletal: Normal range of motion and neck supple.  Cardiovascular:     Rate and Rhythm: Normal rate and regular rhythm.     Heart sounds: Normal heart sounds.  Pulmonary:     Effort: Pulmonary effort is normal.     Breath sounds: Normal breath sounds.  Musculoskeletal: Normal range of motion.  Skin:    General: Skin is warm and dry.  Neurological:     Mental Status: She is alert and oriented to person, place, and time.  Psychiatric:        Mood and Affect: Mood normal.         Thought Content: Thought content normal.        Judgment: Judgment normal.     Results for orders placed or performed in visit on 12/16/18  CBC with Differential/Platelet  Result Value Ref Range   WBC 6.7 3.4 - 10.8 x10E3/uL   RBC 4.59 3.77 - 5.28 x10E6/uL   Hemoglobin 14.3 11.1 - 15.9 g/dL   Hematocrit 41.6 34.0 - 46.6 %   MCV 91 79 - 97 fL   MCH 31.2 26.6 - 33.0 pg   MCHC 34.4 31.5 - 35.7 g/dL   RDW 13.0 11.7 - 15.4 %   Platelets 225 150 - 450 x10E3/uL   Neutrophils 68 Not Estab. %   Lymphs 20 Not Estab. %   Monocytes 7 Not Estab. %   Eos 4 Not Estab. %   Basos 1 Not Estab. %   Neutrophils Absolute 4.6 1.4 - 7.0 x10E3/uL   Lymphocytes Absolute 1.4 0.7 - 3.1 x10E3/uL   Monocytes Absolute 0.5 0.1 - 0.9 x10E3/uL   EOS (ABSOLUTE) 0.2 0.0 - 0.4 x10E3/uL   Basophils Absolute 0.0 0.0 -  0.2 x10E3/uL   Immature Granulocytes 0 Not Estab. %   Immature Grans (Abs) 0.0 0.0 - 0.1 x10E3/uL  Comprehensive metabolic panel  Result Value Ref Range   Glucose 282 (H) 65 - 99 mg/dL   BUN 20 6 - 24 mg/dL   Creatinine, Ser 0.91 0.57 - 1.00 mg/dL   GFR calc non Af Amer 75 >59 mL/min/1.73   GFR calc Af Amer 86 >59 mL/min/1.73   BUN/Creatinine Ratio 22 9 - 23   Sodium 138 134 - 144 mmol/L   Potassium 4.2 3.5 - 5.2 mmol/L   Chloride 95 (L) 96 - 106 mmol/L   CO2 26 20 - 29 mmol/L   Calcium 10.1 8.7 - 10.2 mg/dL   Total Protein 6.9 6.0 - 8.5 g/dL   Albumin 4.4 3.8 - 4.8 g/dL   Globulin, Total 2.5 1.5 - 4.5 g/dL   Albumin/Globulin Ratio 1.8 1.2 - 2.2   Bilirubin Total 1.4 (H) 0.0 - 1.2 mg/dL   Alkaline Phosphatase 88 39 - 117 IU/L   AST 19 0 - 40 IU/L   ALT 23 0 - 32 IU/L  Lipid Panel w/o Chol/HDL Ratio  Result Value Ref Range   Cholesterol, Total 201 (H) 100 - 199 mg/dL   Triglycerides 647 (HH) 0 - 149 mg/dL   HDL 30 (L) >39 mg/dL   VLDL Cholesterol Cal 101 (H) 5 - 40 mg/dL   LDL Chol Calc (NIH) 70 0 - 99 mg/dL  TSH  Result Value Ref Range   TSH 1.830 0.450 - 4.500 uIU/mL   UA/M w/rflx Culture, Routine   Specimen: Urine   URINE  Result Value Ref Range   Specific Gravity, UA 1.010 1.005 - 1.030   pH, UA 5.5 5.0 - 7.5   Color, UA Yellow Yellow   Appearance Ur Hazy (A) Clear   Leukocytes,UA Negative Negative   Protein,UA Negative Negative/Trace   Glucose, UA 3+ (A) Negative   Ketones, UA Negative Negative   RBC, UA Negative Negative   Bilirubin, UA Negative Negative   Urobilinogen, Ur 0.2 0.2 - 1.0 mg/dL   Nitrite, UA Negative Negative  HgB A1c  Result Value Ref Range   Hgb A1c MFr Bld 8.6 (H) 4.8 - 5.6 %   Est. average glucose Bld gHb Est-mCnc 200 mg/dL      Assessment & Plan:   Problem List Items Addressed This Visit      Endocrine   Diabetes ([place]) - Primary    Increase ozempic to 0.5 mg dose, continue remainder of regimen. Continue working on lifestyle modifications      Relevant Medications   Semaglutide,0.25 or 0.5MG /DOS, (OZEMPIC, 0.25 OR 0.5 MG/DOSE,) 2 MG/1.5ML SOPN       Follow up plan: Return in about 2 months (around 03/22/2019) for DM, lipid f/u.

## 2019-01-25 NOTE — Assessment & Plan Note (Signed)
Increase ozempic to 0.5 mg dose, continue remainder of regimen. Continue working on lifestyle modifications

## 2019-02-13 ENCOUNTER — Other Ambulatory Visit: Payer: Self-pay | Admitting: Family Medicine

## 2019-02-15 ENCOUNTER — Encounter: Payer: Self-pay | Admitting: Family Medicine

## 2019-02-26 ENCOUNTER — Other Ambulatory Visit: Payer: Self-pay | Admitting: Family Medicine

## 2019-03-22 ENCOUNTER — Ambulatory Visit: Payer: 59 | Attending: Internal Medicine

## 2019-03-22 ENCOUNTER — Encounter: Payer: Self-pay | Admitting: Family Medicine

## 2019-03-22 DIAGNOSIS — Z20822 Contact with and (suspected) exposure to covid-19: Secondary | ICD-10-CM | POA: Diagnosis not present

## 2019-03-23 ENCOUNTER — Ambulatory Visit: Payer: 59 | Admitting: Family Medicine

## 2019-03-23 ENCOUNTER — Other Ambulatory Visit: Payer: Self-pay

## 2019-03-23 ENCOUNTER — Encounter: Payer: Self-pay | Admitting: Family Medicine

## 2019-03-23 ENCOUNTER — Telehealth (INDEPENDENT_AMBULATORY_CARE_PROVIDER_SITE_OTHER): Payer: 59 | Admitting: Family Medicine

## 2019-03-23 VITALS — Ht 62.0 in | Wt 211.0 lb

## 2019-03-23 DIAGNOSIS — E1122 Type 2 diabetes mellitus with diabetic chronic kidney disease: Secondary | ICD-10-CM

## 2019-03-23 DIAGNOSIS — E782 Mixed hyperlipidemia: Secondary | ICD-10-CM | POA: Diagnosis not present

## 2019-03-23 LAB — NOVEL CORONAVIRUS, NAA: SARS-CoV-2, NAA: NOT DETECTED

## 2019-03-23 NOTE — Progress Notes (Signed)
Ht 5\' 2"  (1.575 m)   Wt 211 lb (95.7 kg)   LMP  (LMP Unknown)   BMI 38.59 kg/m    Subjective:    Patient ID: Holly Norman, female    DOB: 1970-07-08, 49 y.o.   MRN: [id number]  HPI: Holly Norman is a 49 y.o. female  Chief Complaint  Patient presents with  . Diabetes  . Hyperlipidemia    . This visit was completed via WebEx due to the restrictions of the COVID-19 pandemic. All issues as above were discussed and addressed. Physical exam was done as above through visual confirmation on WebEx. If it was felt that the patient should be evaluated in the office, they were directed there. The patient verbally consented to this visit. . Location of the patient: home . Location of the provider: work . Those involved with this call:  . Provider: Merrie Roof, PA-C . CMA: Lesle Chris, [place] . Front Desk/Registration: Jill Side  . Time spent on call: 15 minutes with patient face to face via video conference. More than 50% of this time was spent in counseling and coordination of care. 5 minutes total spent in review of patient's record and preparation of their chart.   DM - Home BSs are around 110-130s. No low blood sugar spells. Has been eating much less since the ozempic, but not exercising regularly.  Taking the lipitor and fenofibrate without issue. Denies CP, SOB, claudication, myalgias.   Relevant past medical, surgical, family and social history reviewed and updated as indicated. Interim medical history since our last visit reviewed. Allergies and medications reviewed and updated.  Review of Systems  Per HPI unless specifically indicated above     Objective:    Ht 5\' 2"  (1.575 m)   Wt 211 lb (95.7 kg)   LMP  (LMP Unknown)   BMI 38.59 kg/m   Wt Readings from Last 3 Encounters:  03/23/19 211 lb (95.7 kg)  01/20/19 211 lb (95.7 kg)  12/16/18 214 lb 9.6 oz (97.3 kg)    Physical Exam Vitals and nursing note reviewed.  Constitutional:      General: She is not in  acute distress.    Appearance: Normal appearance.  HENT:     Head: Atraumatic.     Right Ear: External ear normal.     Left Ear: External ear normal.     Nose: Nose normal. No congestion.     Mouth/Throat:     Mouth: Mucous membranes are moist.     Pharynx: Oropharynx is clear. No posterior oropharyngeal erythema.  Eyes:     Extraocular Movements: Extraocular movements intact.     Conjunctiva/sclera: Conjunctivae normal.  Cardiovascular:     Comments: Unable to assess via virtual visit Pulmonary:     Effort: Pulmonary effort is normal. No respiratory distress.  Musculoskeletal:        General: Normal range of motion.     Cervical back: Normal range of motion.  Skin:    General: Skin is dry.     Findings: No erythema.  Neurological:     Mental Status: She is alert and oriented to person, place, and time.  Psychiatric:        Mood and Affect: Mood normal.        Thought Content: Thought content normal.        Judgment: Judgment normal.     Results for orders placed or performed in visit on 03/22/19  Novel Coronavirus, NAA ([facility])   Specimen:  Nasopharyngeal(NP) swabs in vial transport medium   NASOPHARYNGE  TESTING  Result Value Ref Range   SARS-CoV-2, NAA Not Detected Not Detected      Assessment & Plan:   Problem List Items Addressed This Visit      Endocrine   Diabetes ([place]) - Primary    Recheck lipids, adjust regimen as needed. Continue working on lifestyle habits      Relevant Medications   Semaglutide,0.25 or 0.5MG /DOS, (OZEMPIC, 0.25 OR 0.5 MG/DOSE,) 2 MG/1.5ML SOPN   losartan (COZAAR) 50 MG tablet   empagliflozin (JARDIANCE) 10 MG TABS tablet   Other Relevant Orders   HgB A1c     Other   Hyperlipidemia    Will recheck lipids, continue working on lifestyle habits. Continue current regimen      Relevant Medications   losartan (COZAAR) 50 MG tablet   Other Relevant Orders   Comprehensive metabolic panel   Lipid Panel w/o Chol/HDL Ratio out        Follow up plan: Return in about 3 months (around 06/21/2019) for 6 month f/u.

## 2019-03-26 ENCOUNTER — Other Ambulatory Visit: Payer: Self-pay | Admitting: Family Medicine

## 2019-03-28 MED ORDER — OZEMPIC (0.25 OR 0.5 MG/DOSE) 2 MG/1.5ML ~~LOC~~ SOPN: 0.5000 mg | PEN_INJECTOR | SUBCUTANEOUS | 5 refills | Status: DC

## 2019-03-28 MED ORDER — SERTRALINE HCL 50 MG PO TABS: 50.0000 mg | ORAL_TABLET | Freq: Every day | ORAL | 1 refills | Status: DC

## 2019-03-28 MED ORDER — LOSARTAN POTASSIUM 50 MG PO TABS: 50.0000 mg | ORAL_TABLET | Freq: Every day | ORAL | 1 refills | Status: DC

## 2019-03-28 MED ORDER — JARDIANCE 10 MG PO TABS: 10.0000 mg | ORAL_TABLET | Freq: Every day | ORAL | 1 refills | Status: DC

## 2019-03-28 NOTE — Assessment & Plan Note (Signed)
Will recheck lipids, continue working on lifestyle habits. Continue current regimen

## 2019-03-28 NOTE — Assessment & Plan Note (Signed)
Recheck lipids, adjust regimen as needed. Continue working on lifestyle habits

## 2019-04-13 ENCOUNTER — Other Ambulatory Visit: Payer: Self-pay | Admitting: Family Medicine

## 2019-04-13 DIAGNOSIS — Z1231 Encounter for screening mammogram for malignant neoplasm of breast: Secondary | ICD-10-CM

## 2019-04-13 DIAGNOSIS — N63 Unspecified lump in unspecified breast: Secondary | ICD-10-CM

## 2019-04-13 DIAGNOSIS — R928 Other abnormal and inconclusive findings on diagnostic imaging of breast: Secondary | ICD-10-CM

## 2019-04-22 LAB — HM DIABETES EYE EXAM

## 2019-04-25 ENCOUNTER — Ambulatory Visit
Admission: RE | Admit: 2019-04-25 | Discharge: 2019-04-25 | Disposition: A | Discharge status: Home / Self Care | Payer: 59 | Source: Ambulatory Visit | Attending: Family Medicine | Admitting: Family Medicine

## 2019-04-25 DIAGNOSIS — N63 Unspecified lump in unspecified breast: Secondary | ICD-10-CM

## 2019-04-25 DIAGNOSIS — N6313 Unspecified lump in the right breast, lower outer quadrant: Secondary | ICD-10-CM | POA: Diagnosis not present

## 2019-04-25 DIAGNOSIS — Z1231 Encounter for screening mammogram for malignant neoplasm of breast: Secondary | ICD-10-CM | POA: Diagnosis not present

## 2019-04-25 DIAGNOSIS — R922 Inconclusive mammogram: Secondary | ICD-10-CM | POA: Diagnosis not present

## 2019-04-25 DIAGNOSIS — R928 Other abnormal and inconclusive findings on diagnostic imaging of breast: Secondary | ICD-10-CM

## 2019-05-03 ENCOUNTER — Other Ambulatory Visit: Payer: Self-pay | Admitting: Family Medicine

## 2019-05-03 NOTE — Telephone Encounter (Signed)
Requested Prescriptions  Pending Prescriptions Disp Refills  . atenolol-chlorthalidone (TENORETIC) 100-25 MG tablet [Pharmacy Med Name: Atenolol-Chlorthalidone 100-25 MG Oral Tablet] 90 tablet 0    Sig: Take 1 tablet by mouth once daily     Cardiovascular: Beta Blocker + Diuretic Combos Passed - 05/03/2019  9:24 AM      Passed - K in normal range and within 180 days    Potassium  Date Value Ref Range Status  12/16/2018 4.2 3.5 - 5.2 mmol/L Final         Passed - Na in normal range and within 180 days    Sodium  Date Value Ref Range Status  12/16/2018 138 134 - 144 mmol/L Final         Passed - Cr in normal range and within 180 days    Creatinine, Ser  Date Value Ref Range Status  12/16/2018 0.91 0.57 - 1.00 mg/dL Final         Passed - Ca in normal range and within 180 days    Calcium  Date Value Ref Range Status  12/16/2018 10.1 8.7 - 10.2 mg/dL Final         Passed - Patient is not pregnant      Passed - Last BP in normal range    BP Readings from Last 1 Encounters:  01/20/19 114/78         Passed - Last Heart Rate in normal range    Pulse Readings from Last 1 Encounters:  01/20/19 82         Passed - Valid encounter within last 6 months    Recent Outpatient Visits          1 month ago Type 2 diabetes mellitus with chronic kidney disease, without long-term current use of insulin, unspecified CKD stage [facility])   [place], Lilia Argue, PA-C   3 months ago Type 2 diabetes mellitus with chronic kidney disease, without long-term current use of insulin, unspecified CKD stage [facility])   [place], [place], [place]   4 months ago Mild episode of recurrent major depressive disorder [facility])   [facility] Volney American, [place]   7 months ago Cervical lymphadenopathy   [place], [place], [place]   8 months ago Mild episode of recurrent major depressive disorder [facility])   [place], Lilia Argue, [place]      Future Appointments            In 1 month O[place] Desanctis, Lilia Argue, PA-C [facility], [place]           . atorvastatin (LIPITOR) 40 MG tablet [Pharmacy Med Name: Atorvastatin Calcium 40 MG Oral Tablet] 90 tablet 0    Sig: Take 1 tablet by mouth once daily     Cardiovascular:  Antilipid - Statins Failed - 05/03/2019  9:24 AM      Failed - Total Cholesterol in normal range and within 360 days    Cholesterol, Total  Date Value Ref Range Status  12/16/2018 201 (H) 100 - 199 mg/dL Final   Cholesterol Piccolo, Waived  Date Value Ref Range Status  06/11/2015 WILL FOLLOW  Preliminary         Failed - LDL in normal range and within 360 days    LDL Chol Calc (NIH)  Date Value Ref Range Status  12/16/2018 70 0 - 99 mg/dL Final         Failed - HDL  in normal range and within 360 days    HDL  Date Value Ref Range Status  12/16/2018 30 (L) >39 mg/dL Final         Failed - Triglycerides in normal range and within 360 days    Triglycerides  Date Value Ref Range Status  12/16/2018 647 (HH) 0 - 149 mg/dL Final   Triglycerides Piccolo,Waived  Date Value Ref Range Status  06/11/2015 WILL FOLLOW  Preliminary         Passed - Patient is not pregnant      Passed - Valid encounter within last 12 months    Recent Outpatient Visits          1 month ago Type 2 diabetes mellitus with chronic kidney disease, without long-term current use of insulin, unspecified CKD stage [facility]   [place], Lilia Argue, PA-C   3 months ago Type 2 diabetes mellitus with chronic kidney disease, without long-term current use of insulin, unspecified CKD stage [facility])   [facility] Merrie Roof [place], [place]   4 months ago Mild episode of recurrent major depressive disorder [facility])   [facility] Volney American, [place]   7 months ago Cervical lymphadenopathy   [place], [place], [place]   8 months ago Mild episode of recurrent major depressive disorder [facility])   [place], [place], [place]      Future Appointments            In 1 month Orene Desanctis, Lilia Argue, PA-C [facility]           . sertraline (ZOLOFT) 50 MG tablet [Pharmacy Med Name: Sertraline HCl 50 MG Oral Tablet] 90 tablet 0    Sig: Take 1 tablet by mouth once daily     Psychiatry:  Antidepressants - SSRI Passed - 05/03/2019  9:24 AM      Passed - Completed PHQ-2 or PHQ-9 in the last 360 days.      Passed - Valid encounter within last 6 months    Recent Outpatient Visits          1 month ago Type 2 diabetes mellitus with chronic kidney disease, without long-term current use of insulin, unspecified CKD stage [facility])   [place], Lilia Argue, PA-C   3 months ago Type 2 diabetes mellitus with chronic kidney disease, without long-term current use of insulin, unspecified CKD stage [facility])   [facility] Merrie Roof [place], [place]   4 months ago Mild episode of recurrent major depressive disorder [facility])   [facility] Volney American, [place]   7 months ago Cervical lymphadenopathy   [place], [place], [place]   8 months ago Mild episode of recurrent major depressive disorder [facility])   [place], [place], [place]      Future Appointments            In 1 month Orene Desanctis, Lilia Argue, PA-C [place], [facility]           . losartan (COZAAR) 50 MG tablet [Pharmacy Med Name: Losartan Potassium 50 MG Oral Tablet] 90 tablet 0    Sig: Take 1 tablet by mouth once daily     Cardiovascular:  Angiotensin Receptor Blockers Passed - 05/03/2019  9:24 AM      Passed - Cr in normal range and within 180 days    Creatinine, Ser  Date Value Ref Range Status  12/16/2018 0.91 0.57 - 1.00 mg/dL Final         Passed - K in normal range and within 180  days    Potassium  Date Value Ref Range Status  12/16/2018 4.2 3.5 - 5.2 mmol/L Final         Passed - Patient is not pregnant      Passed - Last BP in normal range    BP Readings from Last 1 Encounters:  01/20/19 114/78         Passed - Valid encounter within last 6 months    Recent Outpatient Visits          1 month ago Type 2 diabetes mellitus with chronic kidney disease, without long-term current use of insulin, unspecified CKD stage [facility])   [place], Lilia Argue, PA-C   3 months ago Type 2 diabetes mellitus with chronic kidney disease, without long-term current use of insulin, unspecified CKD stage [facility])   [place], [place], [place]   4 months ago Mild episode of recurrent major depressive disorder [facility])   [facility] Volney American, [place]   7 months ago Cervical lymphadenopathy   [place], [place], [place]   8 months ago Mild episode of recurrent major depressive disorder [facility])   [place], Lilia Argue, [place]      Future Appointments            In 1 month Orene Desanctis, Lilia Argue, PA-C [facility], [facility]

## 2019-06-27 ENCOUNTER — Ambulatory Visit: Payer: 59 | Admitting: Family Medicine

## 2019-07-22 ENCOUNTER — Ambulatory Visit: Payer: 59 | Admitting: Family Medicine

## 2019-07-25 ENCOUNTER — Encounter: Payer: Self-pay | Admitting: Family Medicine

## 2019-08-04 ENCOUNTER — Encounter: Payer: Self-pay | Admitting: Family Medicine

## 2019-08-04 ENCOUNTER — Ambulatory Visit (INDEPENDENT_AMBULATORY_CARE_PROVIDER_SITE_OTHER): Payer: 59 | Admitting: Family Medicine

## 2019-08-04 ENCOUNTER — Other Ambulatory Visit: Payer: Self-pay

## 2019-08-04 VITALS — BP 107/73 | HR 89 | Temp 98.0°F | Wt 209.0 lb

## 2019-08-04 DIAGNOSIS — F330 Major depressive disorder, recurrent, mild: Secondary | ICD-10-CM

## 2019-08-04 DIAGNOSIS — Z0000 Encounter for general adult medical examination without abnormal findings: Secondary | ICD-10-CM

## 2019-08-04 DIAGNOSIS — E1122 Type 2 diabetes mellitus with diabetic chronic kidney disease: Secondary | ICD-10-CM

## 2019-08-04 DIAGNOSIS — I129 Hypertensive chronic kidney disease with stage 1 through stage 4 chronic kidney disease, or unspecified chronic kidney disease: Secondary | ICD-10-CM

## 2019-08-04 DIAGNOSIS — E782 Mixed hyperlipidemia: Secondary | ICD-10-CM

## 2019-08-04 DIAGNOSIS — R69 Illness, unspecified: Secondary | ICD-10-CM | POA: Diagnosis not present

## 2019-08-04 MED ORDER — OZEMPIC (0.25 OR 0.5 MG/DOSE) 2 MG/1.5ML ~~LOC~~ SOPN: 0.5000 mg | PEN_INJECTOR | SUBCUTANEOUS | 5 refills | Status: DC

## 2019-08-04 MED ORDER — EMPAGLIFLOZIN 10 MG PO TABS: 10.0000 mg | ORAL_TABLET | Freq: Every day | ORAL | 1 refills | Status: DC

## 2019-08-04 MED ORDER — ATORVASTATIN CALCIUM 40 MG PO TABS: 40.0000 mg | ORAL_TABLET | Freq: Every day | ORAL | 1 refills | Status: DC

## 2019-08-04 MED ORDER — SERTRALINE HCL 50 MG PO TABS: 50.0000 mg | ORAL_TABLET | Freq: Every day | ORAL | 1 refills | Status: DC

## 2019-08-04 MED ORDER — FENOFIBRATE 54 MG PO TABS: 54.0000 mg | ORAL_TABLET | Freq: Every day | ORAL | 1 refills | Status: DC

## 2019-08-04 MED ORDER — ATENOLOL-CHLORTHALIDONE 100-25 MG PO TABS: 1.0000 | ORAL_TABLET | Freq: Every day | ORAL | 1 refills | Status: DC

## 2019-08-04 MED ORDER — LOSARTAN POTASSIUM 25 MG PO TABS: 25.0000 mg | ORAL_TABLET | Freq: Every day | ORAL | 1 refills | Status: DC

## 2019-08-04 NOTE — Assessment & Plan Note (Signed)
Recheck lipids, continue current regimen and lifestyle modifications

## 2019-08-04 NOTE — Assessment & Plan Note (Signed)
BPs on lower end of normal and occasional orthostatic hypotensive symptoms. WIll decrease losartan to 25 mg and monitor home readings. Call if readings elevate above normal limits

## 2019-08-04 NOTE — Progress Notes (Signed)
BP 107/73   Pulse 89   Temp 98 F (36.7 C) (Oral)   Wt 209 lb (94.8 kg)   LMP  (LMP Unknown)   SpO2 97%   BMI 38.23 kg/m    Subjective:    Patient ID: Holly Norman, female    DOB: 1971/03/09, 49 y.o.   MRN: [id number]  HPI: Holly Norman is a 49 y.o. female  Chief Complaint  Patient presents with  . Diabetes  . Hypertension  . Hypothyroidism   Here today for 6 month f/u chronic conditions. Taking all medicines faithfully without side effects.   DM - BSs fasting around 90-140, no low blood sugar spells. Trying to eat well and stay active, walking every evening now.   HTN - not checking home readings, occasionally feeling lightheaded with standing. Denies CP, SOB, HAs, syncope.   HLD - has been out of medicine for about a week due to a pharmacy fill error but otherwise takes faithfully without side effects. Denies claudication, myalgias.   Depression and anxiety - zoloft working well, no new concerns. Denies SI/HI.   Depression screen [facility] 2/9 08/04/2019 12/16/2018 08/26/2018  Decreased Interest 0 0 1  Down, Depressed, Hopeless 0 0 1  PHQ - 2 Score 0 0 2  Altered sleeping 1 2 3   Tired, decreased energy 0 0 3  Change in appetite 0 0 0  Feeling bad or failure about yourself  0 0 0  Trouble concentrating 3 1 3   Moving slowly or fidgety/restless 0 0 0  Suicidal thoughts 0 0 0  PHQ-9 Score 4 3 11   Difficult doing work/chores - Not difficult at all -   GAD 7 : Generalized Anxiety Score 08/26/2018  Nervous, Anxious, on Edge 3  Control/stop worrying 3  Worry too much - different things 3  Trouble relaxing 3  Restless 1  Easily annoyed or irritable 3  Afraid - awful might happen 1  Total GAD 7 Score 17  Anxiety Difficulty Very difficult    Relevant past medical, surgical, family and social history reviewed and updated as indicated. Interim medical history since our last visit reviewed. Allergies and medications reviewed and updated.  Review of Systems  Per HPI  unless specifically indicated above     Objective:    BP 107/73   Pulse 89   Temp 98 F (36.7 C) (Oral)   Wt 209 lb (94.8 kg)   LMP  (LMP Unknown)   SpO2 97%   BMI 38.23 kg/m   Wt Readings from Last 3 Encounters:  08/04/19 209 lb (94.8 kg)  03/23/19 211 lb (95.7 kg)  01/20/19 211 lb (95.7 kg)    Physical Exam Vitals and nursing note reviewed.  Constitutional:      Appearance: Normal appearance. She is not ill-appearing.  HENT:     Head: Atraumatic.  Eyes:     Extraocular Movements: Extraocular movements intact.     Conjunctiva/sclera: Conjunctivae normal.  Cardiovascular:     Rate and Rhythm: Normal rate and regular rhythm.     Heart sounds: Normal heart sounds.  Pulmonary:     Effort: Pulmonary effort is normal.     Breath sounds: Normal breath sounds.  Musculoskeletal:        General: Normal range of motion.     Cervical back: Normal range of motion and neck supple.  Skin:    General: Skin is warm and dry.  Neurological:     Mental Status: She is alert and oriented  to person, place, and time.  Psychiatric:        Mood and Affect: Mood normal.        Thought Content: Thought content normal.        Judgment: Judgment normal.     Results for orders placed or performed in visit on 03/22/19  Novel Coronavirus, NAA ([facility])   Specimen: Nasopharyngeal(NP) swabs in vial transport medium   NASOPHARYNGE  TESTING  Result Value Ref Range   SARS-CoV-2, NAA Not Detected Not Detected      Assessment & Plan:   Problem List Items Addressed This Visit      Endocrine   Diabetes ([place]) - Primary    Recheck A1C, adjust as needed. Continue good lifestyle changes and current regimen      Relevant Medications   losartan (COZAAR) 25 MG tablet   Semaglutide,0.25 or 0.5MG /DOS, (OZEMPIC, 0.25 OR 0.5 MG/DOSE,) 2 MG/1.5ML SOPN   atorvastatin (LIPITOR) 40 MG tablet   atenolol-chlorthalidone (TENORETIC) 100-25 MG tablet   empagliflozin (JARDIANCE) 10 MG TABS tablet   Other  Relevant Orders   HgB A1c     Genitourinary   Hypertensive CKD (chronic kidney disease)    BPs on lower end of normal and occasional orthostatic hypotensive symptoms. WIll decrease losartan to 25 mg and monitor home readings. Call if readings elevate above normal limits      Relevant Orders   Comprehensive metabolic panel     Other   Hyperlipidemia    Recheck lipids, continue current regimen and lifestyle modifications      Relevant Medications   losartan (COZAAR) 25 MG tablet   fenofibrate 54 MG tablet   atorvastatin (LIPITOR) 40 MG tablet   atenolol-chlorthalidone (TENORETIC) 100-25 MG tablet   Other Relevant Orders   Lipid Panel w/o Chol/HDL Ratio   Depression    Stable and well controlled, continue current regimen      Relevant Medications   sertraline (ZOLOFT) 50 MG tablet    Other Visit Diagnoses    Annual physical exam           Follow up plan: Return in about 6 months (around 02/04/2020) for CPE.

## 2019-08-04 NOTE — Assessment & Plan Note (Signed)
Stable and well controlled, continue current regimen 

## 2019-08-04 NOTE — Assessment & Plan Note (Signed)
Recheck A1C, adjust as needed. Continue good lifestyle changes and current regimen

## 2019-08-05 ENCOUNTER — Encounter: Payer: Self-pay | Admitting: Family Medicine

## 2019-08-05 LAB — COMPREHENSIVE METABOLIC PANEL
ALT: 23 IU/L (ref 0–32)
AST: 21 IU/L (ref 0–40)
Albumin/Globulin Ratio: 1.6 (ref 1.2–2.2)
Albumin: 4.3 g/dL (ref 3.8–4.8)
Alkaline Phosphatase: 82 IU/L (ref 48–121)
BUN/Creatinine Ratio: 25 — ABNORMAL HIGH (ref 9–23)
BUN: 16 mg/dL (ref 6–24)
Bilirubin Total: 0.8 mg/dL (ref 0.0–1.2)
CO2: 23 mmol/L (ref 20–29)
Calcium: 9.6 mg/dL (ref 8.7–10.2)
Chloride: 96 mmol/L (ref 96–106)
Creatinine, Ser: 0.64 mg/dL (ref 0.57–1.00)
GFR calc Af Amer: 121 mL/min/{1.73_m2} (ref >59)
GFR calc non Af Amer: 105 mL/min/{1.73_m2} (ref >59)
Globulin, Total: 2.7 g/dL (ref 1.5–4.5)
Glucose: 139 mg/dL — ABNORMAL HIGH (ref 65–99)
Potassium: 3.5 mmol/L (ref 3.5–5.2)
Sodium: 139 mmol/L (ref 134–144)
Total Protein: 7 g/dL (ref 6.0–8.5)

## 2019-08-05 LAB — LIPID PANEL W/O CHOL/HDL RATIO
Cholesterol, Total: 302 mg/dL — ABNORMAL HIGH (ref 100–199)
HDL: 39 mg/dL — ABNORMAL LOW (ref >39)
LDL Chol Calc (NIH): 129 mg/dL — ABNORMAL HIGH (ref 0–99)
Triglycerides: 718 mg/dL (ref 0–149)
VLDL Cholesterol Cal: 134 mg/dL — ABNORMAL HIGH (ref 5–40)

## 2019-08-05 LAB — HEMOGLOBIN A1C
Est. average glucose Bld gHb Est-mCnc: 140 mg/dL
Hgb A1c MFr Bld: 6.5 % — ABNORMAL HIGH (ref 4.8–5.6)

## 2019-08-09 ENCOUNTER — Other Ambulatory Visit: Payer: Self-pay | Admitting: Family Medicine

## 2019-08-09 MED ORDER — ATORVASTATIN CALCIUM 80 MG PO TABS
80.0000 mg | ORAL_TABLET | Freq: Every day | ORAL | 1 refills | Status: DC
Start: 2019-08-09 — End: 2019-09-09

## 2019-08-17 ENCOUNTER — Encounter: Payer: Self-pay | Admitting: Family Medicine

## 2019-09-08 ENCOUNTER — Encounter: Payer: Self-pay | Admitting: Family Medicine

## 2019-09-09 ENCOUNTER — Other Ambulatory Visit: Payer: Self-pay | Admitting: Family Medicine

## 2019-09-09 MED ORDER — ATENOLOL-CHLORTHALIDONE 100-25 MG PO TABS: 1.0000 | ORAL_TABLET | Freq: Every day | ORAL | 1 refills | Status: DC

## 2019-09-09 MED ORDER — EMPAGLIFLOZIN 10 MG PO TABS: 10.0000 mg | ORAL_TABLET | Freq: Every day | ORAL | 1 refills | Status: DC

## 2019-09-09 MED ORDER — OZEMPIC (0.25 OR 0.5 MG/DOSE) 2 MG/1.5ML ~~LOC~~ SOPN: 0.5000 mg | PEN_INJECTOR | SUBCUTANEOUS | 1 refills | Status: DC

## 2019-09-09 MED ORDER — FENOFIBRATE 54 MG PO TABS: 54.0000 mg | ORAL_TABLET | Freq: Every day | ORAL | 1 refills | Status: DC

## 2019-09-09 MED ORDER — ATORVASTATIN CALCIUM 80 MG PO TABS: 80.0000 mg | ORAL_TABLET | Freq: Every day | ORAL | 1 refills | Status: DC

## 2019-09-09 MED ORDER — SERTRALINE HCL 50 MG PO TABS: 50.0000 mg | ORAL_TABLET | Freq: Every day | ORAL | 1 refills | Status: DC

## 2019-09-09 MED ORDER — LOSARTAN POTASSIUM 25 MG PO TABS: 25.0000 mg | ORAL_TABLET | Freq: Every day | ORAL | 1 refills | Status: DC

## 2019-09-14 ENCOUNTER — Encounter: Payer: Self-pay | Admitting: Family Medicine

## 2019-10-17 ENCOUNTER — Encounter: Payer: Self-pay | Admitting: Family Medicine

## 2019-11-01 ENCOUNTER — Other Ambulatory Visit: Payer: Self-pay | Admitting: Hospice and Palliative Medicine

## 2019-11-01 ENCOUNTER — Encounter: Payer: Self-pay | Admitting: Hospice and Palliative Medicine

## 2019-11-01 ENCOUNTER — Telehealth (HOSPITAL_COMMUNITY): Payer: Self-pay | Admitting: Hospice and Palliative Medicine

## 2019-11-01 DIAGNOSIS — U071 COVID-19: Secondary | ICD-10-CM

## 2019-11-01 NOTE — Progress Notes (Signed)
I connected by phone with  Ms. Hersh on 11/01/2019 to discuss the potential use of an new treatment for mild to moderate COVID-19 viral infection in non-hospitalized patients.   This patient is a age/sex that meets the FDA criteria for Emergency Use Authorization of casirivimab\imdevimab.  Has a (+) direct SARS-CoV-2 viral test result 1. Has mild or moderate COVID-19  2. Is ? 50 years of age and weighs ? 40 kg 3. Is NOT hospitalized due to COVID-19 4. Is NOT requiring oxygen therapy or requiring an increase in baseline oxygen flow rate due to COVID-19 5. Is within 10 days of symptom onset 6. Has at least one of the high risk factor(s) for progression to severe COVID-19 and/or hospitalization as defined in EUA. ? Specific high risk criteria : obesity and DM   Symptom onset: 8/22 with fever and cough, + yesterday   I have spoken and communicated the following to the patient or parent/caregiver:   1. FDA has authorized the emergency use of casirivimab\imdevimab for the treatment of mild to moderate COVID-19 in adults and pediatric patients with positive results of direct SARS-CoV-2 viral testing who are 73 years of age and older weighing at least 40 kg, and who are at high risk for progressing to severe COVID-19 and/or hospitalization.   2. The significant known and potential risks and benefits of casirivimab\imdevimab, and the extent to which such potential risks and benefits are unknown.   3. Information on available alternative treatments and the risks and benefits of those alternatives, including clinical trials.   4. Patients treated with casirivimab\imdevimab should continue to self-isolate and use infection control measures (e.g., wear mask, isolate, social distance, avoid sharing personal items, clean and disinfect "high touch" surfaces, and frequent handwashing) according to CDC guidelines.    5. The patient or parent/caregiver has the option to accept or refuse casirivimab\imdevimab  .   After reviewing this information with the patient, The patient agreed to proceed with receiving casirivimab\imdevimab infusion and will be provided a copy of the Fact sheet prior to receiving the infusion.Altha Harm, PhD, NP-C [phone] ([place])

## 2019-11-01 NOTE — Telephone Encounter (Signed)
I called to discuss with patient about Covid-19 symptoms and the use of regeneron, a monoclonal antibody infusion for those with mild to moderate Covid-19 symptoms and at a high risk of hospitalization.     Pt is qualified for this infusion at the [facility] due to co-morbid conditions and/or a member of an at-risk group.     Patient is interested and will qualify.  She became symptomatic 8/22 with fever and cough.  Cough is worsening.  She tested positive yesterday at another clinic.  However, patient wants to check her insurance status to see if they will cover the cost prior to scheduling.  I gave her the information for [facility] and patient will call us back if she is interested.   Altha Harm, PhD, NP-C [phone] ([place])

## 2019-11-02 ENCOUNTER — Ambulatory Visit (HOSPITAL_COMMUNITY)
Admission: RE | Admit: 2019-11-02 | Discharge: 2019-11-02 | Disposition: A | Discharge status: Home / Self Care | Payer: HRSA Program | Source: Ambulatory Visit | Attending: Pulmonary Disease | Admitting: Pulmonary Disease

## 2019-11-02 DIAGNOSIS — U071 COVID-19: Secondary | ICD-10-CM | POA: Insufficient documentation

## 2019-11-02 MED ORDER — ALBUTEROL SULFATE HFA 108 (90 BASE) MCG/ACT IN AERS: 2.0000 | INHALATION_SPRAY | Freq: Once | RESPIRATORY_TRACT | Status: DC | PRN

## 2019-11-02 MED ORDER — METHYLPREDNISOLONE SODIUM SUCC 125 MG IJ SOLR: 125.0000 mg | Freq: Once | INTRAMUSCULAR | Status: DC | PRN

## 2019-11-02 MED ORDER — EPINEPHRINE 0.3 MG/0.3ML IJ SOAJ: 0.3000 mg | Freq: Once | INTRAMUSCULAR | Status: DC | PRN

## 2019-11-02 MED ORDER — SODIUM CHLORIDE 0.9 % IV SOLN
1200.0000 mg | Freq: Once | INTRAVENOUS | Status: AC
  Administered 2019-11-02: 1200 mg via INTRAVENOUS
  Filled 2019-11-02: qty 10

## 2019-11-02 MED ORDER — DIPHENHYDRAMINE HCL 50 MG/ML IJ SOLN: 50.0000 mg | Freq: Once | INTRAMUSCULAR | Status: DC | PRN

## 2019-11-02 MED ORDER — FAMOTIDINE IN NACL 20-0.9 MG/50ML-% IV SOLN: 20.0000 mg | Freq: Once | INTRAVENOUS | Status: DC | PRN

## 2019-11-02 MED ORDER — SODIUM CHLORIDE 0.9 % IV SOLN: INTRAVENOUS | Status: DC | PRN

## 2019-11-02 NOTE — Progress Notes (Signed)
  Diagnosis: COVID-19  Physician:Dr Patrick Wright  Procedure: Covid Infusion Clinic Med: casirivimab\imdevimab infusion - Provided patient with casirivimab\imdevimab fact sheet for patients, parents and caregivers prior to infusion.  Complications: No immediate complications noted.  Discharge: Discharged home   Ricquel Foulk 11/02/2019  

## 2019-11-02 NOTE — Discharge Instructions (Signed)

## 2019-11-06 ENCOUNTER — Encounter: Payer: Self-pay | Admitting: Family Medicine

## 2020-02-09 ENCOUNTER — Encounter: Payer: 59 | Admitting: Family Medicine

## 2020-02-13 ENCOUNTER — Encounter: Payer: 59 | Admitting: Family Medicine

## 2020-03-22 ENCOUNTER — Other Ambulatory Visit: Payer: Self-pay | Admitting: Family Medicine

## 2020-04-03 ENCOUNTER — Encounter: Payer: Self-pay | Admitting: Family Medicine

## 2020-04-03 ENCOUNTER — Other Ambulatory Visit: Payer: Self-pay

## 2020-04-03 ENCOUNTER — Ambulatory Visit (INDEPENDENT_AMBULATORY_CARE_PROVIDER_SITE_OTHER): Payer: 59 | Admitting: Family Medicine

## 2020-04-03 VITALS — BP 101/67 | HR 83 | Temp 98.6°F | Wt 196.2 lb

## 2020-04-03 DIAGNOSIS — Z1159 Encounter for screening for other viral diseases: Secondary | ICD-10-CM

## 2020-04-03 DIAGNOSIS — E782 Mixed hyperlipidemia: Secondary | ICD-10-CM

## 2020-04-03 DIAGNOSIS — R21 Rash and other nonspecific skin eruption: Secondary | ICD-10-CM | POA: Insufficient documentation

## 2020-04-03 DIAGNOSIS — E1122 Type 2 diabetes mellitus with diabetic chronic kidney disease: Secondary | ICD-10-CM | POA: Diagnosis not present

## 2020-04-03 DIAGNOSIS — I129 Hypertensive chronic kidney disease with stage 1 through stage 4 chronic kidney disease, or unspecified chronic kidney disease: Secondary | ICD-10-CM | POA: Diagnosis not present

## 2020-04-03 DIAGNOSIS — N181 Chronic kidney disease, stage 1: Secondary | ICD-10-CM

## 2020-04-03 LAB — BAYER DCA HB A1C WAIVED: HB A1C (BAYER DCA - WAIVED): 5.4 % (ref <7.0)

## 2020-04-03 MED ORDER — OZEMPIC (0.25 OR 0.5 MG/DOSE) 2 MG/1.5ML ~~LOC~~ SOPN
0.2500 mg | PEN_INJECTOR | SUBCUTANEOUS | 1 refills | Status: DC
Start: 2020-04-03 — End: 2020-08-09

## 2020-04-03 MED ORDER — ATENOLOL 50 MG PO TABS: 50.0000 mg | ORAL_TABLET | Freq: Every day | ORAL | 0 refills | Status: DC

## 2020-04-03 MED ORDER — PREDNISONE 20 MG PO TABS: 40.0000 mg | ORAL_TABLET | Freq: Every day | ORAL | 0 refills | Status: AC

## 2020-04-03 MED ORDER — CHLORTHALIDONE 25 MG PO TABS: 12.5000 mg | ORAL_TABLET | Freq: Every day | ORAL | 0 refills | Status: DC

## 2020-04-03 NOTE — Assessment & Plan Note (Signed)
BP low normal today with orthostatic sx, like 2/2 lifestyle changes and weight loss. Will decrease atenolol-chlorthalidone, continue losartan. Check BMP today. F/u in 3 months.

## 2020-04-03 NOTE — Progress Notes (Signed)
   SUBJECTIVE:   CHIEF COMPLAINT / HPI:   Patient Active Problem List   Diagnosis Date Noted  . Rash 04/03/2020  . Varicose veins 10/24/2014  . Hyperlipidemia 10/24/2014  . Hypertensive CKD (chronic kidney disease) 10/24/2014  . Diabetes ([place]) 10/24/2014  . Depression 10/24/2014  . CKD (chronic kidney disease) 10/24/2014  . Restless legs 10/24/2014   RASH - started on R forearm 4 days ago, now on L forearm, under breasts, chest, inner thigh  - benadryl helps with itching. - stayed in hotel recently but no contacts with the same - no one else at home with rash Itching: yes Burning: no Redness: yes Oozing: no Scaling: no Blisters: no Painful: no Fevers: no Change in detergents/soaps/personal care products: no Recent illness: no Recent travel:no History of same: no Context: better Alleviating factors: hydrocortisone cream and benadryl Treatments attempted:hydrocortisone cream and benadryl Shortness of breath: no  Throat/tongue swelling: no Myalgias/arthralgias: no  Hypertension: - Medications: losartan 25mg , atenolol-chlorthalidone 100-25mg  - Compliance: good - Checking BP at home: yes, SBP 80-100s - Denies any SOB, CP, vision changes, LE edema, medication SEs - some lightheadedness with changing positions - has lost some weight, 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   Diabetes, Type 2 - Last A1c 6.5 07/2019 - Medications: jardiance 10mg , ozempic 0.5mg  weekly - Compliance: good - Checking BG at home: occasionally, 90s-117 - Eye exam: UTD - Foot exam: due - Statin: yes - Denies symptoms of hypoglycemia, polyuria, polydipsia, numbness extremities, foot ulcers/trauma    OBJECTIVE:   BP 101/67   Pulse 83   Temp 98.6 F (37 C)   Wt 196 lb 4 oz (89 kg)   LMP  (LMP Unknown)   SpO2 97%   BMI 35.89 kg/m   Gen: well appearing, in NAD Card: RRR, no murmur Lungs: CTAB Ext: WWP , no edema Skin: diffuse, welt-like rash to trunk with few  excoriations to b/l forearms. Spares palms and soles. No sloughing, bleeding, purulence, scale.  ASSESSMENT/PLAN:   Diabetes Doing well on current regimen with CBGs in goal range. Will obtain A1c today.  Hypertensive CKD (chronic kidney disease) BP low normal today with orthostatic sx, like 2/2 lifestyle changes and weight loss. Will decrease atenolol-chlorthalidone, continue losartan. Check BMP today. F/u in 3 months.  Hyperlipidemia Compliant with statin. Rechecking LDL today.  Rash Appears consistent with contact irritant. Will rx prednisone burst. Continue prn hydrocortisone cream. F/u if no better.     Myles Gip, DO

## 2020-04-03 NOTE — Assessment & Plan Note (Signed)
Compliant with statin. Rechecking LDL today.

## 2020-04-03 NOTE — Addendum Note (Signed)
Addended by: Myles Gip on: 04/03/2020 04:49 PM   Modules accepted: Orders

## 2020-04-03 NOTE — Patient Instructions (Signed)
It was great to see you!  Our plans for today:  - Take the steroid as prescribed. Let us know if your rash does not improve with this. You can continue to use hydrocortisone cream as needed. - We are decreasing your blood pressure medication.  - Come back in 3 months for follow up.  We are checking some labs today, we will release these results to your MyChart.  Take care and seek immediate care sooner if you develop any concerns.   Dr. Ky Barban

## 2020-04-03 NOTE — Assessment & Plan Note (Signed)
Appears consistent with contact irritant. Will rx prednisone burst. Continue prn hydrocortisone cream. F/u if no better.

## 2020-04-03 NOTE — Assessment & Plan Note (Signed)
Doing well on current regimen with CBGs in goal range. Will obtain A1c today.

## 2020-04-04 LAB — BASIC METABOLIC PANEL
BUN/Creatinine Ratio: 23 (ref 9–23)
BUN: 21 mg/dL (ref 6–24)
CO2: 24 mmol/L (ref 20–29)
Calcium: 9.8 mg/dL (ref 8.7–10.2)
Chloride: 103 mmol/L (ref 96–106)
Creatinine, Ser: 0.91 mg/dL (ref 0.57–1.00)
GFR calc Af Amer: 85 mL/min/{1.73_m2} (ref >59)
GFR calc non Af Amer: 74 mL/min/{1.73_m2} (ref >59)
Glucose: 106 mg/dL — ABNORMAL HIGH (ref 65–99)
Potassium: 4.7 mmol/L (ref 3.5–5.2)
Sodium: 141 mmol/L (ref 134–144)

## 2020-04-04 LAB — HEPATITIS C ANTIBODY: Hep C Virus Ab: <0.1 s/co ratio (ref 0.0–0.9)

## 2020-04-04 LAB — LDL CHOLESTEROL, DIRECT: LDL Direct: 170 mg/dL — ABNORMAL HIGH (ref 0–99)

## 2020-04-04 MED ORDER — EZETIMIBE 10 MG PO TABS: 10.0000 mg | ORAL_TABLET | Freq: Every day | ORAL | 3 refills | Status: DC

## 2020-04-04 NOTE — Addendum Note (Signed)
Addended by: Myles Gip on: 04/04/2020 09:38 AM   Modules accepted: Orders

## 2020-05-05 ENCOUNTER — Other Ambulatory Visit: Payer: Self-pay | Admitting: Family Medicine

## 2020-05-05 NOTE — Telephone Encounter (Signed)
Requested Prescriptions  Pending Prescriptions Disp Refills  . sertraline (ZOLOFT) 50 MG tablet [Pharmacy Med Name: Sertraline HCl 50 MG Oral Tablet] 90 tablet 0    Sig: Take 1 tablet by mouth once daily     Psychiatry:  Antidepressants - SSRI Passed - 05/05/2020  1:06 PM      Passed - Completed PHQ-2 or PHQ-9 in the last 360 days      Passed - Valid encounter within last 6 months    Recent Outpatient Visits          1 month ago Type 2 diabetes mellitus with chronic kidney disease, without long-term current use of insulin, unspecified CKD stage [facility])   [place] Rory Percy M, DO   9 months ago Type 2 diabetes mellitus with chronic kidney disease, without long-term current use of insulin, unspecified CKD stage [facility])   [place], Lilia Argue, PA-C   1 year ago Type 2 diabetes mellitus with chronic kidney disease, without long-term current use of insulin, unspecified CKD stage [facility])   [place], Lilia Argue, PA-C   1 year ago Type 2 diabetes mellitus with chronic kidney disease, without long-term current use of insulin, unspecified CKD stage [facility])   [facility] Volney American, PA-C   1 year ago Mild episode of recurrent major depressive disorder [facility])   [place], Rachel Elizabeth, [place]      Future Appointments            In 1 month Jon Billings, NP [facility] [place]

## 2020-05-19 ENCOUNTER — Other Ambulatory Visit: Payer: Self-pay | Admitting: Family Medicine

## 2020-05-19 NOTE — Telephone Encounter (Signed)
Requested Prescriptions  Pending Prescriptions Disp Refills  . losartan (COZAAR) 25 MG tablet [Pharmacy Med Name: Losartan Potassium 25 MG Oral Tablet] 90 tablet 0    Sig: Take 1 tablet by mouth once daily     Cardiovascular:  Angiotensin Receptor Blockers Passed - 05/19/2020  6:42 PM      Passed - Cr in normal range and within 180 days    Creatinine, Ser  Date Value Ref Range Status  04/03/2020 0.91 0.57 - 1.00 mg/dL Final         Passed - K in normal range and within 180 days    Potassium  Date Value Ref Range Status  04/03/2020 4.7 3.5 - 5.2 mmol/L Final         Passed - Patient is not pregnant      Passed - Last BP in normal range    BP Readings from Last 1 Encounters:  04/03/20 101/67         Passed - Valid encounter within last 6 months    Recent Outpatient Visits          1 month ago Type 2 diabetes mellitus with chronic kidney disease, without long-term current use of insulin, unspecified CKD stage [facility])   [place] Myles Gip, DO   9 months ago Type 2 diabetes mellitus with chronic kidney disease, without long-term current use of insulin, unspecified CKD stage [facility])   [place], Lilia Argue, PA-C   1 year ago Type 2 diabetes mellitus with chronic kidney disease, without long-term current use of insulin, unspecified CKD stage [facility])   [place], Lilia Argue, PA-C   1 year ago Type 2 diabetes mellitus with chronic kidney disease, without long-term current use of insulin, unspecified CKD stage [facility])   [facility] Volney American, [place]   1 year ago Mild episode of recurrent major depressive disorder [facility])   [place], Rachel Elizabeth, [place]      Future Appointments            In 1 month Jon Billings, NP [facility] [place]

## 2020-07-02 ENCOUNTER — Ambulatory Visit: Payer: 59 | Admitting: Nurse Practitioner

## 2020-08-04 ENCOUNTER — Other Ambulatory Visit: Payer: Self-pay | Admitting: Family Medicine

## 2020-08-04 NOTE — Telephone Encounter (Signed)
Requested Prescriptions  Pending Prescriptions Disp Refills  . chlorthalidone (HYGROTON) 25 MG tablet [Pharmacy Med Name: Chlorthalidone 25 MG Oral Tablet] 45 tablet 0    Sig: Take 1/2 (one-half) tablet by mouth once daily     Cardiovascular: Diuretics - Thiazide Passed - 08/04/2020  7:48 AM      Passed - Ca in normal range and within 360 days    Calcium  Date Value Ref Range Status  04/03/2020 9.8 8.7 - 10.2 mg/dL Final         Passed - Cr in normal range and within 360 days    Creatinine, Ser  Date Value Ref Range Status  04/03/2020 0.91 0.57 - 1.00 mg/dL Final         Passed - K in normal range and within 360 days    Potassium  Date Value Ref Range Status  04/03/2020 4.7 3.5 - 5.2 mmol/L Final         Passed - Na in normal range and within 360 days    Sodium  Date Value Ref Range Status  04/03/2020 141 134 - 144 mmol/L Final         Passed - Last BP in normal range    BP Readings from Last 1 Encounters:  04/03/20 101/67         Passed - Valid encounter within last 6 months    Recent Outpatient Visits          4 months ago Type 2 diabetes mellitus with chronic kidney disease, without long-term current use of insulin, unspecified CKD stage ([place])   [facility] Myles Gip, DO   1 year ago Type 2 diabetes mellitus with chronic kidney disease, without long-term current use of insulin, unspecified CKD stage ([place])   [place], Lilia Argue, PA-C   1 year ago Type 2 diabetes mellitus with chronic kidney disease, without long-term current use of insulin, unspecified CKD stage [facility])   [place], Lilia Argue, PA-C   1 year ago Type 2 diabetes mellitus with chronic kidney disease, without long-term current use of insulin, unspecified CKD stage [facility])   [facility] Volney American, [place]   1 year ago Mild episode of recurrent major depressive disorder [facility])   [place],  [place], PA-C             . atenolol (TENORMIN) 50 MG tablet [Pharmacy Med Name: Atenolol 50 MG Oral Tablet] 90 tablet 0    Sig: Take 1 tablet by mouth once daily     Cardiovascular:  Beta Blockers Passed - 08/04/2020  7:48 AM      Passed - Last BP in normal range    BP Readings from Last 1 Encounters:  04/03/20 101/67         Passed - Last Heart Rate in normal range    Pulse Readings from Last 1 Encounters:  04/03/20 83         Passed - Valid encounter within last 6 months    Recent Outpatient Visits          4 months ago Type 2 diabetes mellitus with chronic kidney disease, without long-term current use of insulin, unspecified CKD stage [facility])   [name] Myles Gip, DO   1 year ago Type 2 diabetes mellitus with chronic kidney disease, without long-term current use of insulin, unspecified CKD stage [facility])   [facility] Volney American, [place]  1 year ago Type 2 diabetes mellitus with chronic kidney disease, without long-term current use of insulin, unspecified CKD stage [facility])   [facility] Volney American, [place]   1 year ago Type 2 diabetes mellitus with chronic kidney disease, without long-term current use of insulin, unspecified CKD stage [facility])   [facility] Volney American, [place]   1 year ago Mild episode of recurrent major depressive disorder [facility])   [facility] Volney American, [place]

## 2020-08-04 NOTE — Telephone Encounter (Signed)
Requested Prescriptions  Pending Prescriptions Disp Refills  . JARDIANCE 10 MG TABS tablet [Pharmacy Med Name: Jardiance 10 MG Oral Tablet] 90 tablet 0    Sig: Take 1 tablet by mouth once daily     Endocrinology:  Diabetes - SGLT2 Inhibitors Failed - 08/04/2020  7:48 AM      Failed - LDL in normal range and within 360 days    LDL Chol Calc (NIH)  Date Value Ref Range Status  08/04/2019 129 (H) 0 - 99 mg/dL Final   LDL Direct  Date Value Ref Range Status  04/03/2020 170 (H) 0 - 99 mg/dL Final         Passed - Cr in normal range and within 360 days    Creatinine, Ser  Date Value Ref Range Status  04/03/2020 0.91 0.57 - 1.00 mg/dL Final         Passed - HBA1C is between 0 and 7.9 and within 180 days    HB A1C (BAYER DCA - WAIVED)  Date Value Ref Range Status  04/03/2020 5.4 <7.0 % Final    Comment:                                          Diabetic Adult            <7.0                                       Healthy Adult        4.3 - 5.7                                                           (DCCT/NGSP) American Diabetes Association's Summary of Glycemic Recommendations for Adults with Diabetes: Hemoglobin A1c <7.0%. More stringent glycemic goals (A1c <6.0%) may further reduce complications at the cost of increased risk of hypoglycemia.          Passed - eGFR in normal range and within 360 days    GFR calc Af Amer  Date Value Ref Range Status  04/03/2020 85 >59 mL/min/1.73 Final    Comment:    **In accordance with recommendations from the NKF-ASN Task force,**   [facility] is in the process of updating its eGFR calculation to the   2021 CKD-EPI creatinine equation that estimates kidney function   without a race variable.    GFR calc non Af Amer  Date Value Ref Range Status  04/03/2020 74 >59 mL/min/1.73 Final         Passed - Valid encounter within last 6 months    Recent Outpatient Visits          4 months ago Type 2 diabetes mellitus with chronic kidney disease,  without long-term current use of insulin, unspecified CKD stage [facility])   [facility] Myles Gip, DO   1 year ago Type 2 diabetes mellitus with chronic kidney disease, without long-term current use of insulin, unspecified CKD stage [facility])   [facility] Volney American, PA-C   1 year ago Type 2 diabetes mellitus with chronic kidney disease, without long-term current  use of insulin, unspecified CKD stage [facility])   [facility] Volney American, [place]   1 year ago Type 2 diabetes mellitus with chronic kidney disease, without long-term current use of insulin, unspecified CKD stage [facility])   [facility] Volney American, [place]   1 year ago Mild episode of recurrent major depressive disorder [facility])   [facility] Volney American, [place]

## 2020-08-08 ENCOUNTER — Other Ambulatory Visit: Payer: Self-pay | Admitting: Family Medicine

## 2020-08-08 NOTE — Telephone Encounter (Signed)
Lvm to make this apt. 

## 2020-08-08 NOTE — Telephone Encounter (Signed)
leave voicemail to make apt.

## 2020-08-08 NOTE — Telephone Encounter (Signed)
Notes to clinic:  Appt canceled on 07/02/2020 to see new provider  Patient was due fr a 3 month follow up    Requested Prescriptions  Pending Prescriptions Disp Refills   JARDIANCE 10 MG TABS tablet [Pharmacy Med Name: Jardiance 10 MG Oral Tablet] 30 tablet 0    Sig: Take 1 tablet by mouth once daily      Endocrinology:  Diabetes - SGLT2 Inhibitors Failed - 08/08/2020  8:11 AM      Failed - LDL in normal range and within 360 days    LDL Chol Calc (NIH)  Date Value Ref Range Status  08/04/2019 129 (H) 0 - 99 mg/dL Final   LDL Direct  Date Value Ref Range Status  04/03/2020 170 (H) 0 - 99 mg/dL Final          Passed - Cr in normal range and within 360 days    Creatinine, Ser  Date Value Ref Range Status  04/03/2020 0.91 0.57 - 1.00 mg/dL Final          Passed - HBA1C is between 0 and 7.9 and within 180 days    HB A1C (BAYER DCA - WAIVED)  Date Value Ref Range Status  04/03/2020 5.4 <7.0 % Final    Comment:                                          Diabetic Adult            <7.0                                       Healthy Adult        4.3 - 5.7                                                           (DCCT/NGSP) American Diabetes Association's Summary of Glycemic Recommendations for Adults with Diabetes: Hemoglobin A1c <7.0%. More stringent glycemic goals (A1c <6.0%) may further reduce complications at the cost of increased risk of hypoglycemia.           Passed - eGFR in normal range and within 360 days    GFR calc Af Amer  Date Value Ref Range Status  04/03/2020 85 >59 mL/min/1.73 Final    Comment:    **In accordance with recommendations from the NKF-ASN Task force,**   [facility] is in the process of updating its eGFR calculation to the   2021 CKD-EPI creatinine equation that estimates kidney function   without a race variable.    GFR calc non Af Amer  Date Value Ref Range Status  04/03/2020 74 >59 mL/min/1.73 Final          Passed - Valid encounter  within last 6 months    Recent Outpatient Visits           4 months ago Type 2 diabetes mellitus with chronic kidney disease, without long-term current use of insulin, unspecified CKD stage [facility])   [facility] Myles Gip, DO   1 year ago Type 2 diabetes mellitus with chronic kidney disease, without long-term current  use of insulin, unspecified CKD stage [facility])   [facility] Volney American, [place]   1 year ago Type 2 diabetes mellitus with chronic kidney disease, without long-term current use of insulin, unspecified CKD stage [facility])   [facility] Volney American, PA-C   1 year ago Type 2 diabetes mellitus with chronic kidney disease, without long-term current use of insulin, unspecified CKD stage [facility])   [facility] Volney American, [place]   1 year ago Mild episode of recurrent major depressive disorder [facility])   [facility] Volney American, [place]

## 2020-08-08 NOTE — Telephone Encounter (Signed)
   Notes to clinic:  Last appt was canceled on 06/28/2020 to see new provider  Review for refill    Requested Prescriptions  Pending Prescriptions Disp Refills   OZEMPIC, 0.25 OR 0.5 MG/DOSE, 2 MG/1.5ML SOPN [Pharmacy Med Name: Ozempic (0.25 or 0.5 MG/DOSE) 2 MG/1.5ML Subcutaneous Solution Pen-injector] 2 mL 0    Sig: INJECT 0.5MG  INTO THE SKIN ONCE A WEEK      Endocrinology:  Diabetes - GLP-1 Receptor Agonists Passed - 08/08/2020  7:15 AM      Passed - HBA1C is between 0 and 7.9 and within 180 days    HB A1C (BAYER DCA - WAIVED)  Date Value Ref Range Status  04/03/2020 5.4 <7.0 % Final    Comment:                                          Diabetic Adult            <7.0                                       Healthy Adult        4.3 - 5.7                                                           (DCCT/NGSP) American Diabetes Association's Summary of Glycemic Recommendations for Adults with Diabetes: Hemoglobin A1c <7.0%. More stringent glycemic goals (A1c <6.0%) may further reduce complications at the cost of increased risk of hypoglycemia.           Passed - Valid encounter within last 6 months    Recent Outpatient Visits           4 months ago Type 2 diabetes mellitus with chronic kidney disease, without long-term current use of insulin, unspecified CKD stage [facility])   [facility] Myles Gip, DO   1 year ago Type 2 diabetes mellitus with chronic kidney disease, without long-term current use of insulin, unspecified CKD stage [facility])   [place], Rachel Elizabeth, PA-C   1 year ago Type 2 diabetes mellitus with chronic kidney disease, without long-term current use of insulin, unspecified CKD stage [facility])   [facility] Volney American, PA-C   1 year ago Type 2 diabetes mellitus with chronic kidney disease, without long-term current use of insulin, unspecified CKD stage [facility])   [facility] Volney American, [place]    1 year ago Mild episode of recurrent major depressive disorder [facility])   [facility] Volney American, [place]

## 2020-08-09 NOTE — Telephone Encounter (Signed)
Sent 5 days ago.  Should be enough to get patient to her appointment.

## 2020-08-09 NOTE — Telephone Encounter (Signed)
Pt scheduled for apt on 08/24/2020

## 2020-08-09 NOTE — Telephone Encounter (Signed)
Pt scheuled for apt on 08/24/2020

## 2020-08-24 ENCOUNTER — Ambulatory Visit: Payer: 59 | Admitting: Nurse Practitioner

## 2020-09-07 DIAGNOSIS — E1122 Type 2 diabetes mellitus with diabetic chronic kidney disease: Secondary | ICD-10-CM

## 2020-09-09 ENCOUNTER — Other Ambulatory Visit: Payer: Self-pay | Admitting: Nurse Practitioner

## 2020-09-09 NOTE — Telephone Encounter (Signed)
Pt requests refill of sertraline. Last OV 08/04/19 (where issue was addressed) Last RF 05/05/20 #90 no RF. Sent pt message to make appointment via [place]. Requested Prescriptions  Pending Prescriptions Disp Refills   sertraline (ZOLOFT) 50 MG tablet [Pharmacy Med Name: Sertraline HCl 50 MG Oral Tablet] 90 tablet 0    Sig: Take 1 tablet by mouth once daily      Psychiatry:  Antidepressants - SSRI Passed - 09/09/2020  1:56 PM      Passed - Completed PHQ-2 or PHQ-9 in the last 360 days      Passed - Valid encounter within last 6 months    Recent Outpatient Visits           5 months ago Type 2 diabetes mellitus with chronic kidney disease, without long-term current use of insulin, unspecified CKD stage [facility])   [facility] Myles Gip, DO   1 year ago Type 2 diabetes mellitus with chronic kidney disease, without long-term current use of insulin, unspecified CKD stage [facility])   [place], Lilia Argue, PA-C   1 year ago Type 2 diabetes mellitus with chronic kidney disease, without long-term current use of insulin, unspecified CKD stage [facility])   [place], Lilia Argue, PA-C   1 year ago Type 2 diabetes mellitus with chronic kidney disease, without long-term current use of insulin, unspecified CKD stage [facility])   [facility] Volney American, [place]   1 year ago Mild episode of recurrent major depressive disorder [facility])   [facility] Volney American, [place]

## 2020-09-11 NOTE — Telephone Encounter (Signed)
Pt is out of medicine but scheduled appt 7/8

## 2020-09-13 ENCOUNTER — Telehealth: Payer: Self-pay

## 2020-09-13 NOTE — Chronic Care Management (AMB) (Signed)
  Care Management   Note  09/13/2020 Name: Holly Norman MRN: [id number] DOB: December 18, 1970  Holly Norman is a 50 y.o. year old female who is a primary care patient of Jon Billings, NP. I reached out to Holly Norman by phone today in response to a referral sent by Ms. Georgetta Haber Lesmeister's health plan.    [name] was given information about care management services today including:  Care management services include personalized support from designated clinical staff supervised by her physician, including individualized plan of care and coordination with other care providers 24/7 contact phone numbers for assistance for urgent and routine care needs. The patient may stop care management services at any time by phone call to the office staff.  Patient agreed to services and verbal consent obtained.   Follow up plan: Telephone appointment with care management team member scheduled for:09/26/2020  Noreene Larsson, [place], [name], [place] [address] Direct Dial: [phone] [email or web address]: [name]

## 2020-09-14 ENCOUNTER — Ambulatory Visit (INDEPENDENT_AMBULATORY_CARE_PROVIDER_SITE_OTHER): Payer: Self-pay | Admitting: Nurse Practitioner

## 2020-09-14 ENCOUNTER — Other Ambulatory Visit: Payer: Self-pay

## 2020-09-14 ENCOUNTER — Encounter: Payer: Self-pay | Admitting: Nurse Practitioner

## 2020-09-14 VITALS — BP 137/92 | HR 91 | Temp 98.7°F

## 2020-09-14 DIAGNOSIS — E782 Mixed hyperlipidemia: Secondary | ICD-10-CM

## 2020-09-14 DIAGNOSIS — E1122 Type 2 diabetes mellitus with diabetic chronic kidney disease: Secondary | ICD-10-CM

## 2020-09-14 DIAGNOSIS — N181 Chronic kidney disease, stage 1: Secondary | ICD-10-CM

## 2020-09-14 DIAGNOSIS — F330 Major depressive disorder, recurrent, mild: Secondary | ICD-10-CM

## 2020-09-14 MED ORDER — FENOFIBRATE 54 MG PO TABS: 54.0000 mg | ORAL_TABLET | Freq: Every day | ORAL | 1 refills | Status: DC

## 2020-09-14 MED ORDER — SERTRALINE HCL 50 MG PO TABS: 50.0000 mg | ORAL_TABLET | Freq: Every day | ORAL | 1 refills | Status: DC

## 2020-09-14 MED ORDER — ATORVASTATIN CALCIUM 80 MG PO TABS: 80.0000 mg | ORAL_TABLET | Freq: Every day | ORAL | 1 refills | Status: DC

## 2020-09-14 MED ORDER — LOSARTAN POTASSIUM 25 MG PO TABS: 25.0000 mg | ORAL_TABLET | Freq: Every day | ORAL | 1 refills | Status: DC

## 2020-09-14 MED ORDER — ATENOLOL 50 MG PO TABS: 50.0000 mg | ORAL_TABLET | Freq: Every day | ORAL | 1 refills | Status: DC

## 2020-09-14 NOTE — Assessment & Plan Note (Signed)
Chronic.  Controlled.  Continue with current medication regimen.  Labs ordered today.  Return to clinic in 3 months for reevaluation.  Call sooner if concerns arise.   

## 2020-09-14 NOTE — Assessment & Plan Note (Signed)
Chronic.  Controlled.  Continue with current medication regimen.  Labs ordered today.  Refills sent today.  Return to clinic in 2 months for reevaluation.  Call sooner if concerns arise.

## 2020-09-14 NOTE — Progress Notes (Signed)
BP (!) 137/92   Pulse 91   Temp 98.7 F (37.1 C)   LMP  (LMP Unknown)   SpO2 98%    Subjective:    Patient ID: Holly Norman, female    DOB: 14-Mar-1970, 50 y.o.   MRN: [id number]  HPI: Holly Norman is a 50 y.o. female  Chief Complaint  Patient presents with   Diabetes    HYPERTENSION / [place] Patient has been out of medication for about a week Satisfied with current treatment? yes Duration of hypertension: years BP monitoring frequency: weekly BP range: 120/70 BP medication side effects: no Past BP meds: atenolol and losartan (cozaar) Duration of hyperlipidemia: years Cholesterol medication side effects: no Cholesterol supplements: fish oil Past cholesterol medications: atorvastain (lipitor) Medication compliance: excellent compliance Aspirin: no Recent stressors: no Recurrent headaches: no Visual changes: no Palpitations: no Dyspnea: no Chest pain: no Lower extremity edema: no Dizzy/lightheaded: no  DIABETES Patient has been out of Jardiance for 3-4 weeks and she has been out of Ozempic for 2 weeks. Hypoglycemic episodes:no Polydipsia/polyuria: no Visual disturbance: no Chest pain: no Paresthesias: no Glucose Monitoring: no  Accucheck frequency: Not Checking  Fasting glucose:  Post prandial:  Evening:  Before meals: Taking Insulin?: no  Long acting insulin:  Short acting insulin: Blood Pressure Monitoring: weekly Retinal Examination: Not up to Date Foot Exam: Not up to Date Diabetic Education: Not Completed Pneumovax: Not up to Date Influenza: Not up to Date Aspirin: no  DEPRESSION Patient states her mood is well controlled. She has been out of the Zoloft and can tell that she needs the medication.  Denies SI.    Relevant past medical, surgical, family and social history reviewed and updated as indicated. Interim medical history since our last visit reviewed. Allergies and medications reviewed and updated.  Review of Systems   Eyes:  Negative for visual disturbance.  Respiratory:  Negative for cough, chest tightness and shortness of breath.   Cardiovascular:  Negative for chest pain, palpitations and leg swelling.  Endocrine: Negative for polydipsia and polyuria.  Neurological:  Negative for dizziness, numbness and headaches.   Per HPI unless specifically indicated above     Objective:    BP (!) 137/92   Pulse 91   Temp 98.7 F (37.1 C)   LMP  (LMP Unknown)   SpO2 98%   Wt Readings from Last 3 Encounters:  04/03/20 196 lb 4 oz (89 kg)  08/04/19 209 lb (94.8 kg)  03/23/19 211 lb (95.7 kg)    Physical Exam Vitals and nursing note reviewed.  Constitutional:      General: She is not in acute distress.    Appearance: Normal appearance. She is not ill-appearing, toxic-appearing or diaphoretic.  HENT:     Head: Normocephalic.     Right Ear: External ear normal.     Left Ear: External ear normal.     Nose: Nose normal.     Mouth/Throat:     Mouth: Mucous membranes are moist.     Pharynx: Oropharynx is clear.  Eyes:     General:        Right eye: No discharge.        Left eye: No discharge.     Extraocular Movements: Extraocular movements intact.     Conjunctiva/sclera: Conjunctivae normal.     Pupils: Pupils are equal, round, and reactive to light.  Cardiovascular:     Rate and Rhythm: Normal rate and regular rhythm.     Heart  sounds: No murmur heard. Pulmonary:     Effort: Pulmonary effort is normal. No respiratory distress.     Breath sounds: Normal breath sounds. No wheezing or rales.  Musculoskeletal:     Cervical back: Normal range of motion and neck supple.  Skin:    General: Skin is warm and dry.     Capillary Refill: Capillary refill takes less than 2 seconds.  Neurological:     General: No focal deficit present.     Mental Status: She is alert and oriented to person, place, and time. Mental status is at baseline.  Psychiatric:        Mood and Affect: Mood normal.         Behavior: Behavior normal.        Thought Content: Thought content normal.        Judgment: Judgment normal.    Results for orders placed or performed in visit on 04/03/20  Bayer DCA Hb A1c Waived (STAT)  Result Value Ref Range   HB A1C (BAYER DCA - WAIVED) 5.4 <7.0 %  Direct LDL  Result Value Ref Range   LDL Direct 170 (H) 0 - 99 mg/dL  Hepatitis C antibody  Result Value Ref Range   Hep C Virus Ab <0.1 0.0 - 0.9 s/co ratio  Basic Metabolic Panel (BMET)  Result Value Ref Range   Glucose 106 (H) 65 - 99 mg/dL   BUN 21 6 - 24 mg/dL   Creatinine, Ser 0.91 0.57 - 1.00 mg/dL   GFR calc non Af Amer 74 >59 mL/min/1.73   GFR calc Af Amer 85 >59 mL/min/1.73   BUN/Creatinine Ratio 23 9 - 23   Sodium 141 134 - 144 mmol/L   Potassium 4.7 3.5 - 5.2 mmol/L   Chloride 103 96 - 106 mmol/L   CO2 24 20 - 29 mmol/L   Calcium 9.8 8.7 - 10.2 mg/dL      Assessment & Plan:   Problem List Items Addressed This Visit       Endocrine   Diabetes ([place]) - Primary    Chronic.  Controlled.  Continue with current medication regimen.  Labs ordered today.  Return to clinic in 3 months for reevaluation.  Call sooner if concerns arise.  Referral has been placed to CCM to help with medications.  Will possibly restart metformin based on lab results.  Patient is not able to afford Jardiance or Ozempic at this time.         Relevant Medications   losartan (COZAAR) 25 MG tablet   atorvastatin (LIPITOR) 80 MG tablet   Other Relevant Orders   HgB A1c   Comp Met (CMET)   Lipid Profile     Genitourinary   CKD (chronic kidney disease)    Chronic.  Controlled.  Continue with current medication regimen.  Labs ordered today.  Return to clinic in 3 months for reevaluation.  Call sooner if concerns arise.         Relevant Orders   HgB A1c   Comp Met (CMET)   Lipid Profile     Other   Hyperlipidemia    Chronic.  Controlled.  Continue with current medication regimen.  Labs ordered today.  Refills sent  today. Return to clinic in 3 months for reevaluation.  Call sooner if concerns arise.         Relevant Medications   losartan (COZAAR) 25 MG tablet   fenofibrate 54 MG tablet   atorvastatin (LIPITOR) 80 MG tablet  atenolol (TENORMIN) 50 MG tablet   Other Relevant Orders   HgB A1c   Comp Met (CMET)   Lipid Profile   Depression    Chronic.  Controlled.  Continue with current medication regimen.  Labs ordered today.  Refills sent today.  Return to clinic in 2 months for reevaluation.  Call sooner if concerns arise.         Relevant Medications   sertraline (ZOLOFT) 50 MG tablet     Follow up plan: Return in about 3 months (around 12/15/2020) for HTN, HLD, DM2 FU.

## 2020-09-14 NOTE — Assessment & Plan Note (Signed)
Chronic.  Controlled.  Continue with current medication regimen.  Labs ordered today. Refills sent today.   Return to clinic in 3 months for reevaluation.  Call sooner if concerns arise.    

## 2020-09-14 NOTE — Assessment & Plan Note (Signed)
Chronic.  Controlled.  Continue with current medication regimen.  Labs ordered today.  Return to clinic in 3 months for reevaluation.  Call sooner if concerns arise.  Referral has been placed to CCM to help with medications.  Will possibly restart metformin based on lab results.  Patient is not able to afford Jardiance or Ozempic at this time.

## 2020-09-15 LAB — COMPREHENSIVE METABOLIC PANEL
ALT: 29 IU/L (ref 0–32)
AST: 28 IU/L (ref 0–40)
Albumin/Globulin Ratio: 1.8 (ref 1.2–2.2)
Albumin: 4.6 g/dL (ref 3.8–4.8)
Alkaline Phosphatase: 69 IU/L (ref 44–121)
BUN/Creatinine Ratio: 20 (ref 9–23)
BUN: 18 mg/dL (ref 6–24)
Bilirubin Total: 0.6 mg/dL (ref 0.0–1.2)
CO2: 26 mmol/L (ref 20–29)
Calcium: 9.8 mg/dL (ref 8.7–10.2)
Chloride: 102 mmol/L (ref 96–106)
Creatinine, Ser: 0.88 mg/dL (ref 0.57–1.00)
Globulin, Total: 2.5 g/dL (ref 1.5–4.5)
Glucose: 195 mg/dL — ABNORMAL HIGH (ref 65–99)
Potassium: 4.1 mmol/L (ref 3.5–5.2)
Sodium: 141 mmol/L (ref 134–144)
Total Protein: 7.1 g/dL (ref 6.0–8.5)
eGFR: 80 mL/min/{1.73_m2} (ref >59)

## 2020-09-15 LAB — LIPID PANEL
Chol/HDL Ratio: 5.9 ratio — ABNORMAL HIGH (ref 0.0–4.4)
Cholesterol, Total: 242 mg/dL — ABNORMAL HIGH (ref 100–199)
HDL: 41 mg/dL (ref >39)
LDL Chol Calc (NIH): 124 mg/dL — ABNORMAL HIGH (ref 0–99)
Triglycerides: 430 mg/dL — ABNORMAL HIGH (ref 0–149)
VLDL Cholesterol Cal: 77 mg/dL — ABNORMAL HIGH (ref 5–40)

## 2020-09-15 LAB — HEMOGLOBIN A1C
Est. average glucose Bld gHb Est-mCnc: 154 mg/dL
Hgb A1c MFr Bld: 7 % — ABNORMAL HIGH (ref 4.8–5.6)

## 2020-09-17 MED ORDER — FENOFIBRATE 145 MG PO TABS: 145.0000 mg | ORAL_TABLET | Freq: Every day | ORAL | 1 refills | Status: DC

## 2020-09-17 MED ORDER — GLIPIZIDE 5 MG PO TABS: 5.0000 mg | ORAL_TABLET | Freq: Two times a day (BID) | ORAL | 3 refills | Status: DC

## 2020-09-17 MED ORDER — METFORMIN HCL 1000 MG PO TABS: 1000.0000 mg | ORAL_TABLET | Freq: Two times a day (BID) | ORAL | 3 refills | Status: DC

## 2020-09-17 NOTE — Progress Notes (Signed)
Medication sent to the pharmacy.

## 2020-09-17 NOTE — Addendum Note (Signed)
Addended by: Jon Billings on: 09/17/2020 09:51 AM   Modules accepted: Orders

## 2020-09-17 NOTE — Progress Notes (Signed)
Please let patient know that her A1c increased from 5.4 to 7.0.  With that said, we need to start metformin and glipizide.  These are more cost effective than the ozempic and jardiance.  I think we should also increase her fenofibrate to 145mg .  If patient agrees I will send these to the pharmacy. We will keep her appointment for 3 months so we can make sure the medications are effective.

## 2020-09-26 ENCOUNTER — Telehealth: Payer: Self-pay | Admitting: General Practice

## 2020-09-26 ENCOUNTER — Telehealth: Payer: Self-pay

## 2020-09-26 NOTE — Telephone Encounter (Signed)
  Care Management   Follow Up Note   09/26/2020 Name: Holly Norman MRN: [id number] DOB: 1970-05-09   Referred by: Jon Billings, NP Reason for referral : Chronic Care Management and Care Coordination (RNCM: Initial Outreach for Chronic Disease Management and Care Coordination Needs- Attempt)   An unsuccessful telephone outreach was attempted today. The patient was referred to the case management team for assistance with care management and care coordination.   Follow Up Plan: A HIPPA compliant phone message was left for the patient providing contact information and requesting a return call.   Noreene Larsson RN, MSN, [facility] Mobile: [phone]

## 2020-10-05 ENCOUNTER — Telehealth: Payer: Self-pay | Admitting: General Practice

## 2020-10-05 ENCOUNTER — Ambulatory Visit: Payer: Self-pay | Admitting: General Practice

## 2020-10-05 DIAGNOSIS — N189 Chronic kidney disease, unspecified: Secondary | ICD-10-CM

## 2020-10-05 DIAGNOSIS — I10 Essential (primary) hypertension: Secondary | ICD-10-CM

## 2020-10-05 DIAGNOSIS — F330 Major depressive disorder, recurrent, mild: Secondary | ICD-10-CM

## 2020-10-05 DIAGNOSIS — E782 Mixed hyperlipidemia: Secondary | ICD-10-CM

## 2020-10-05 DIAGNOSIS — E1122 Type 2 diabetes mellitus with diabetic chronic kidney disease: Secondary | ICD-10-CM

## 2020-10-05 NOTE — Chronic Care Management (AMB) (Signed)
Care Management    RN Visit Note  10/05/2020 Name: Holly Norman MRN: [id number] DOB: Jul 17, 1970  Subjective: Holly Norman is a 50 y.o. year old female who is a primary care patient of Jon Billings, NP. The care management team was consulted for assistance with disease management and care coordination needs.    Engaged with patient by telephone for initial visit in response to provider referral for case management and/or care coordination services.   Consent to Services:   Ms. Coyt was given information about Care Management services today including:  Care Management services includes personalized support from designated clinical staff supervised by her physician, including individualized plan of care and coordination with other care providers 24/7 contact phone numbers for assistance for urgent and routine care needs. The patient may stop case management services at any time by phone call to the office staff.  Patient agreed to services and consent obtained.   Assessment: Review of patient past medical history, allergies, medications, health status, including review of consultants reports, laboratory and other test data, was performed as part of comprehensive evaluation and provision of chronic care management services.   SDOH (Social Determinants of Health) assessments and interventions performed:    Care Plan  Allergies  Allergen Reactions   Ivp Dye [Iodinated Diagnostic Agents] Shortness Of Breath    Outpatient Encounter Medications as of 10/05/2020  Medication Sig   atenolol (TENORMIN) 50 MG tablet Take 1 tablet (50 mg total) by mouth daily.   atorvastatin (LIPITOR) 80 MG tablet Take 1 tablet (80 mg total) by mouth daily.   ezetimibe (ZETIA) 10 MG tablet Take 1 tablet (10 mg total) by mouth daily.   fenofibrate (TRICOR) 145 MG tablet Take 1 tablet (145 mg total) by mouth daily.   glipiZIDE (GLUCOTROL) 5 MG tablet Take 1 tablet (5 mg total) by mouth 2 (two) times  daily before a meal.   losartan (COZAAR) 25 MG tablet Take 1 tablet (25 mg total) by mouth daily.   metFORMIN (GLUCOPHAGE) 1000 MG tablet Take 1 tablet (1,000 mg total) by mouth 2 (two) times daily with a meal.   Omega-3 Fatty Acids (FISH OIL) 1000 MG CAPS Take 1,000 mg by mouth daily.   sertraline (ZOLOFT) 50 MG tablet Take 1 tablet (50 mg total) by mouth daily.   JARDIANCE 10 MG TABS tablet Take 1 tablet by mouth once daily (Patient not taking: Reported on 10/05/2020)   OZEMPIC, 0.25 OR 0.5 MG/DOSE, 2 MG/1.5ML SOPN INJECT 0.$RemoveBefor'5MG'nHDSsPTEvdbY$  INTO THE SKIN ONCE A WEEK (Patient not taking: Reported on 10/05/2020)   No facility-administered encounter medications on file as of 10/05/2020.    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   Diabetes ([place]) 10/24/2014   Depression 10/24/2014   CKD (chronic kidney disease) 10/24/2014   Restless legs 10/24/2014    Conditions to be addressed/monitored: HTN, HLD, DMII, CKD Stage 3, and Depression  Care Plan : RNCM: General plan of care for HTN, HLD, DM, CKD3, and depression  Updates made by Vanita Ingles since 10/05/2020 12:00 AM     Problem: RNCM: General plan of care for HTN, HLD, DM, CKD3, and depression   Priority: High  Onset Date: 10/05/2020     Long-Range Goal: RNCM: General plan of care for HTN, HLD, DM, CKD3, and depression   Start Date: 10/05/2020  Expected End Date:  10/05/2021  This Visit's Progress: On track  Priority: High  Note:   Current Barriers:  Knowledge Deficits related to plan of care for management of HTN, HLD, DMII, CKD Stage 3, and Depression  Care Coordination needs related to Financial constraints related to medication cost constraints  in a patient with HTN, HLD, DMII, CKD Stage 3, and Depression Chronic Disease Management support and education needs related to HTN, HLD, DMII, CKD Stage 3, and  Depression Financial Constraints.   RNCM Clinical Goal(s):  Patient will verbalize understanding of plan for management of HTN, HLD, DMII, CKD Stage 3, and Depression work with RN Case Manager to address needs related to HTN, HLD, DMII, CKD Stage 3, and Depression and Financial constraints related to medication cost constraints, currently the patient is taking alternate medications that are more cost effective, knows LCSW available as needed  take all medications exactly as prescribed and will call provider for medication related questions attend all scheduled medical appointments: 12-17-2020 at 4 pm demonstrate a decrease in HTN, HLD, DMII, CKD Stage 3, and Depression exacerbations demonstrate improved adherence to prescribed treatment plan for HTN, HLD, DMII, CKD Stage 3, and Depression demonstrate improved health management independence verbalize basic understanding of HTN, HLD, DMII, CKD Stage 3, and Depression disease process and self health management plan demonstrate understanding of rationale for each prescribed medication demonstrate ongoing self health care management ability through collaboration with RN Care manager, provider, and care team.   Interventions: 1:1 collaboration with primary care provider regarding development and update of comprehensive plan of care as evidenced by provider attestation and co-signature Inter-disciplinary care team collaboration (see longitudinal plan of care)   SDOH Barriers (Status: New goal. Goal on track: YES.)  Patient interviewed and SDOH assessment performed        Patient interviewed and appropriate assessments performed Provided patient with information about the CCM team and the LCSW being available for any needs related to depression or other needs. Also that pharmacist available for questions related to medications and assistance. Talked about the Medication management clinic in [facility] and the patient states right now she is able  to get her medications as the pcp has given her alternate medications for disease process that are more cost effective. The patient knows options are available if needed.   Discussed plans with patient for ongoing care management follow up and provided patient with direct contact information for care management team Advised patient to call the [facility] for changes in SDOH needs, questions, or concerns. The patient is looking into getting suitable insurance for her to have to meet her health and wellness needs.  Provided education to patient/caregiver regarding level of care options.    Diabetes:  (Status: New goal. Goal on track: YES.) Lab Results  Component Value Date   HGBA1C 7.0 (H) 09/14/2020  Assessed patient's understanding of A1c goal: <7% Provided education to patient about basic DM disease process; Reviewed medications with patient and discussed importance of medication adherence; Counseled on importance of regular laboratory monitoring as prescribed; Discussed plans with patient for ongoing care management follow up and provided patient with direct contact information for care management team; Provided patient with written educational materials related to hypo and hyperglycemia and importance of correct treatment; Reviewed scheduled/upcoming provider appointments including: 12-17-2020 at 4 pm; Advised patient, providing education and rationale, to check cbg BID and record, calling pcp for findings outside established parameters; Review of patient status, including review of consultants reports, relevant laboratory and other test results,  and medications completed; Advised patient to discuss how often she needs to check blood sugars  with provider; Review of fasting blood sugar goal of <130 and post prandial of <180. The patient has not been checking her blood sugars consistently but will start. She knows what to look for with dropping blood sugars. Also discussed possible gi upset while  taking Metformin. Will continue to monitor for changes and needs related to DM health. Will provide education material by [employer], EMMI platform and via mail of healthy eating options for the patient with DM and other chronic conditions.   Hyperlipidemia:  (Status: New goal. Goal on track: YES.) Lab Results  Component Value Date   CHOL 242 (H) 09/14/2020   HDL 41 09/14/2020   LDLCALC 124 (H) 09/14/2020   LDLDIRECT 170 (H) 04/03/2020   TRIG 430 (H) 09/14/2020   CHOLHDL 5.9 (H) 09/14/2020     Medication review performed; medication list updated in electronic medical record.  Provider established cholesterol goals reviewed; Counseled on importance of regular laboratory monitoring as prescribed; Provided HLD educational materials; Reviewed role and benefits of statin for ASCVD risk reduction; Discussed strategies to manage statin-induced myalgias; Reviewed importance of limiting foods high in cholesterol; Medications review: Zetia 10 mg daily, Lipitor 80 mg daily, Tricor has been increased to 145 mg daily, and the patient also takes Omega 3  Hypertension: (Status: New goal. Goal on track: YES.) Last practice recorded BP readings:  BP Readings from Last 3 Encounters:  09/14/20 (!) 137/92  04/03/20 101/67  11/02/19 101/69  Most recent eGFR/CrCl:  Lab Results  Component Value Date   EGFR 80 09/14/2020    No components found for: CRCL  Evaluation of current treatment plan related to hypertension self management and patient's adherence to plan as established by provider; Provided education to patient re: stroke prevention, s/s of heart attack and stroke; Reviewed medications with patient and discussed importance of compliance; Discussed plans with patient for ongoing care management follow up and provided patient with direct contact information for care management team; Advised patient, providing education and rationale, to monitor blood pressure daily and record, calling PCP for  findings outside established parameters;  Reviewed scheduled/upcoming provider appointments including:  Provided education on prescribed diet Heart healthy/ADA diet- sending educational material through [facility], Mychart, and mail to the patient;  Discussed complications of poorly controlled blood pressure such as heart disease, stroke, circulatory complications, vision complications, kidney impairment, sexual dysfunction;   CKD3  (Status: New goal. Goal on track: YES.) Evaluation of current treatment plan related to CKD Stage 3 ,  self-management and patient's adherence to plan as established by provider. Discussed plans with patient for ongoing care management follow up and provided patient with direct contact information for care management team Evaluation of current treatment plan related to CKD3 and patient's adherence to plan as established by provider; Advised patient to call the office for changes, questions, or concerns; Discussed plans with patient for ongoing care management follow up and provided patient with direct contact information for care management team; Screening for signs and symptoms of depression related to chronic disease state;  Assessed social determinant of health barriers;   Depression Status: New goal. Goal on track: YES.) Evaluation of current treatment plan related to Depression, Financial constraints related to medications  self-management and patient's adherence to plan as established by provider. Discussed plans with patient for ongoing care management follow up and provided patient with direct contact information for care management team Evaluation of current treatment  plan related to depression  and patient's adherence to plan as established by provider; Advised patient to call the office for changes, questions, or concerns, changes in mood, anxiety or depression level;  Discussed plans with patient for ongoing care management follow up and provided patient with  direct contact information for care management team; Screening for signs and symptoms of depression related to chronic disease state;  Assessed social determinant of health barriers;   Patient Goals/Self-Care Activities: Patient will self administer medications as prescribed Patient will attend all scheduled provider appointments Patient will call pharmacy for medication refills Patient will attend church or other social activities Patient will continue to perform ADL's independently Patient will continue to perform IADL's independently Patient will call provider office for new concerns or questions Patient will work with BSW to address care coordination needs and will continue to work with the clinical team to address health care and disease management related needs.         Plan: Telephone follow up appointment with care management team member scheduled for:  11-23-2020 at 28 am  Noreene Larsson RN, MSN, [facility] Mobile: [phone]

## 2020-10-05 NOTE — Patient Instructions (Signed)
Visit Information  PATIENT GOALS:   Goals Addressed             This Visit's Progress    RNCM: Manage My Medicine       Timeframe:  Long-Range Goal Priority:  High Start Date:       10-05-2020                      Expected End Date:           10-05-2021            Follow Up Date 11/23/2020    - call for medicine refill 2 or 3 days before it runs out - call if I am sick and can't take my medicine - keep a list of all the medicines I take; vitamins and herbals too - learn to read medicine labels - use a pillbox to sort medicine - use an alarm clock or phone to remind me to take my medicine    Why is this important?   These steps will help you keep on track with your medicines.   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 The patient has new medications of Metformin and glipizide and is taking. Talked to the patient about the [facility] but she states she is doing okay with getting her medications at this time. Will let the RNCM know if she has any new concerns. Review of the patient with the side effects of Metformin today with possible GI issues. The patient is experiencing GI symptoms.      RNCM: Monitor and Manage My Blood Sugar-Diabetes Type 2       Timeframe:  Long-Range Goal Priority:  High Start Date:    10-05-2020                         Expected End Date:  10-05-2021                     Follow Up Date 11/23/2020    - check blood sugar at prescribed times - check blood sugar before and after exercise - check blood sugar if I feel it is too high or too low - enter blood sugar readings and medication or insulin into daily log - take the blood sugar log to all doctor visits - take the blood sugar meter to all doctor visits    Why is this important?   Checking your blood sugar at home helps to keep it from getting very high or very low.  Writing the results in a diary or log helps the doctor  know how to care for you.  Your blood sugar log should have the time, date and the results.  Also, write down the amount of insulin or other medicine that you take.  Other information, like what you ate, exercise done and how you were feeling, will also be helpful.     Notes: 10-05-2020: The patient has not been checking her blood sugars. Recommended the patient start checking her blood sugars since starting new medications. Review of fasting blood sugar <130 and post prandial of <180. The patient loves sweet tea and pasta. Recommendations given to the patient today on other options to try. Will send information through [place], [place] system and mail on healthy eating and dietary assistance. Will continue to monitor.      RNCM: Set My  Target A1C-Diabetes Type 2       Timeframe:  Short-Term Goal Priority:  High Start Date:      10-05-2020                       Expected End Date:    02-26-2021                   Follow Up Date 11/23/2020    - set target A1C    Why is this important?   Your target A1C is decided together by you and your doctor.  It is based on several things like your age and other health issues.    Notes: 10-05-2020: Discussed goal of hemoglobin A1C of 7.0 or less     RNCM: Track and Manage My Symptoms-Chronic Kidney       Timeframe:  Long-Range Goal Priority:  Medium Start Date:   10-05-2020                          Expected End Date:         10-05-2021              Follow Up Date 11/23/2020    - balance periods of rest with activity as needed - develop a new routine to improve sleep - don't eat or exercise right before bedtime - exercise at least 2 to 3 times per week - get outdoors every day (weather permitting) - keep all lab appointments - keep room cool and dark - make shared treatment decisions with doctor - pace activity allowing for rest - take a warm shower or bath before bed - take medicine as prescribed - use a fan or white noise in bedroom - use mild soap  and lotion on itchy skin - watch for early signs of feeling worse    Why is this important?   Keeping track of symptoms can help you feel the best. It also helps the doctor stay on top of any changes to the disease. It may also help keep your disease from getting worse.  Taking simple steps can help you cope with symptoms like feeling very tired or itchy skin.     Notes: 10-05-2020: CKD stable at this time. Will continue to monitor.      RNCM: Track and Manage My Symptoms-Depression       Timeframe:  Long-Range Goal Priority:  High Start Date:      10-05-2020                       Expected End Date:       10-05-2021                Follow Up Date 11/23/2020    - avoid negative self-talk - develop a personal safety plan - develop a plan to deal with triggers like holidays, anniversaries - exercise at least 2 to 3 times per week - have a plan for how to handle bad days - spend time or talk with others at least 2 to 3 times per week - spend time or talk with others every day - watch for early signs of feeling worse - write in journal every day    Why is this important?   Keeping track of your progress will help your treatment team find the right mix of medicine and therapy for you.  Write in your journal every day.  Day-to-day changes in depression symptoms are normal. It may be more helpful to check your progress at the end of each week instead of every day.     Notes: 10-05-2020: Depression is stable at this time. Denies any new concerns. Will continue to monitor.         Ms. Tranbarger was given information about Care Management services by the embedded care coordination team including:  Care Management services include personalized support from designated clinical staff supervised by her physician, including individualized plan of care and coordination with other care providers 24/7 contact phone numbers for assistance for urgent and routine care needs. The patient may stop CCM  services at any time (effective at the end of the month) by phone call to the office staff.  Patient agreed to services and verbal consent obtained.   Patient verbalizes understanding of instructions provided today and agrees to view in [place].   Telephone follow up appointment with care management team member scheduled for:11-23-2020 at 45 am  [place], MSN, [facility] Mobile: [phone]  [email or web address]">  DASH Eating Plan DASH stands for Dietary Approaches to Stop Hypertension. The DASH eating plan is a healthy eating plan that has been shown to: Reduce high blood pressure (hypertension). Reduce your risk for type 2 diabetes, heart disease, and stroke. Help with weight loss. What are tips for following this plan? Reading food labels Check food labels for the amount of salt (sodium) per serving. Choose foods with less than 5 percent of the Daily Value of sodium. Generally, foods with less than 300 milligrams (mg) of sodium per serving fit into this eating plan. To find whole grains, look for the word "whole" as the first word in the ingredient list. Shopping Buy products labeled as "low-sodium" or "no salt added." Buy fresh foods. Avoid canned foods and pre-made or frozen meals. Cooking Avoid adding salt when cooking. Use salt-free seasonings or herbs instead of table salt or sea salt. Check with your health care provider or pharmacist before using salt substitutes. Do not fry foods. Cook foods using healthy methods such as baking, boiling, grilling, roasting, and broiling instead. Cook with heart-healthy oils, such as olive, canola, avocado, soybean, or sunflower oil. Meal planning  Eat a balanced diet that includes: 4 or more servings of fruits and 4 or more servings of vegetables each day. Try to fill one-half of your plate with fruits and  vegetables. 6-8 servings of whole grains each day. Less than 6 oz (170 g) of lean meat, poultry, or fish each day. A 3-oz (85-g) serving of meat is about the same size as a deck of cards. One egg equals 1 oz (28 g). 2-3 servings of low-fat dairy each day. One serving is 1 cup (237 mL). 1 serving of nuts, seeds, or beans 5 times each week. 2-3 servings of heart-healthy fats. Healthy fats called omega-3 fatty acids are found in foods such as walnuts, flaxseeds, fortified milks, and eggs. These fats are also found in cold-water fish, such as sardines, salmon, and mackerel. Limit how much you eat of: Canned or prepackaged foods. Food that is high in trans fat, such as some fried foods. Food that is high in saturated fat, such as fatty meat. Desserts and other sweets, sugary drinks, and other foods with added sugar. Full-fat dairy products. Do not salt foods before eating. Do not eat more than 4 egg yolks a week. Try to eat at  least 2 vegetarian meals a week. Eat more home-cooked food and less restaurant, buffet, and fast food.  Lifestyle When eating at a restaurant, ask that your food be prepared with less salt or no salt, if possible. If you drink alcohol: Limit how much you use to: 0-1 drink a day for women who are not pregnant. 0-2 drinks a day for men. Be aware of how much alcohol is in your drink. In the [place] one drink equals one 12 oz bottle of beer (355 mL), one 5 oz glass of wine (148 mL), or one 1 oz glass of hard liquor (44 mL). General information Avoid eating more than 2,300 mg of salt a day. If you have hypertension, you may need to reduce your sodium intake to 1,500 mg a day. Work with your health care provider to maintain a healthy body weight or to lose weight. Ask what an ideal weight is for you. Get at least 30 minutes of exercise that causes your heart to beat faster (aerobic exercise) most days of the week. Activities may include walking, swimming, or biking. Work with  your health care provider or dietitian to adjust your eating plan to your individual calorie needs. What foods should I eat? Fruits All fresh, dried, or frozen fruit. Canned fruit in natural juice (without addedsugar). Vegetables Fresh or frozen vegetables (raw, steamed, roasted, or grilled). Low-sodium or reduced-sodium tomato and vegetable juice. Low-sodium or reduced-sodium tomatosauce and tomato paste. Low-sodium or reduced-sodium canned vegetables. Grains Whole-grain or whole-wheat bread. Whole-grain or whole-wheat pasta. Brown rice. [place]. Bulgur. Whole-grain and low-sodium cereals. Pita bread.Low-fat, low-sodium crackers. Whole-wheat flour tortillas. Meats and other proteins Skinless chicken or [place]. Ground chicken or [place]. Pork with fat trimmed off. Fish and seafood. Egg whites. Dried beans, peas, or lentils. Unsalted nuts, nut butters, and seeds. Unsalted canned beans. Lean cuts of beef with fat trimmed off. Low-sodium, lean precooked or cured meat, such as sausages or meatloaves. Dairy Low-fat (1%) or fat-free (skim) milk. Reduced-fat, low-fat, or fat-free cheeses. Nonfat, low-sodium ricotta or cottage cheese. Low-fat or nonfatyogurt. Low-fat, low-sodium cheese. Fats and oils Soft margarine without trans fats. Vegetable oil. Reduced-fat, low-fat, or light mayonnaise and salad dressings (reduced-sodium). Canola, safflower, olive, avocado, soybean, andsunflower oils. Avocado. Seasonings and condiments Herbs. Spices. Seasoning mixes without salt. Other foods Unsalted popcorn and pretzels. Fat-free sweets. The items listed above may not be a complete list of foods and beverages you can eat. Contact a dietitian for more information. What foods should I avoid? Fruits Canned fruit in a light or heavy syrup. Fried fruit. Fruit in cream or buttersauce. Vegetables Creamed or fried vegetables. Vegetables in a cheese sauce. Regular canned vegetables (not low-sodium or  reduced-sodium). Regular canned tomato sauce and paste (not low-sodium or reduced-sodium). Regular tomato and vegetable juice(not low-sodium or reduced-sodium). Angie Fava. Olives. Grains Baked goods made with fat, such as croissants, muffins, or some breads. Drypasta or rice meal packs. Meats and other proteins Fatty cuts of meat. Ribs. Fried meat. Berniece Salines. Bologna, salami, and other precooked or cured meats, such as sausages or meat loaves. Fat from the back of a pig (fatback). Bratwurst. Salted nuts and seeds. Canned beans with added salt. Canned orsmoked fish. Whole eggs or egg yolks. Chicken or [place] with skin. Dairy Whole or 2% milk, cream, and half-and-half. Whole or full-fat cream cheese. Whole-fat or sweetened yogurt. Full-fat cheese. Nondairy creamers. Whippedtoppings. Processed cheese and cheese spreads. Fats and oils Butter. Stick margarine. Lard. Shortening. Ghee. Bacon fat. Tropical oils, suchas  coconut, palm kernel, or palm oil. Seasonings and condiments Onion salt, garlic salt, seasoned salt, table salt, and sea salt. Worcestershire sauce. Tartar sauce. Barbecue sauce. Teriyaki sauce. Soy sauce, including reduced-sodium. Steak sauce. Canned and packaged gravies. Fish sauce. Oyster sauce. Cocktail sauce. Store-bought horseradish. Ketchup. Mustard. Meat flavorings and tenderizers. Bouillon cubes. Hot sauces. Pre-made or packaged marinades. Pre-made or packaged taco seasonings. Relishes. Regular saladdressings. Other foods Salted popcorn and pretzels. The items listed above may not be a complete list of foods and beverages you should avoid. Contact a dietitian for more information. Where to find more information National Heart, Lung, and Blood Institute: https://wilson-eaton.com/ American Heart Association: www.heart.org Academy of Nutrition and Dietetics: www.eatright.[place]: www.kidney.org Summary The DASH eating plan is a healthy eating plan that has been shown to  reduce high blood pressure (hypertension). It may also reduce your risk for type 2 diabetes, heart disease, and stroke. When on the DASH eating plan, aim to eat more fresh fruits and vegetables, whole grains, lean proteins, low-fat dairy, and heart-healthy fats. With the DASH eating plan, you should limit salt (sodium) intake to 2,300 mg a day. If you have hypertension, you may need to reduce your sodium intake to 1,500 mg a day. Work with your health care provider or dietitian to adjust your eating plan to your individual calorie needs. This information is not intended to replace advice given to you by your health care provider. Make sure you discuss any questions you have with your healthcare provider. Document Revised: 01/28/2019 Document Reviewed: 01/28/2019 Elsevier Patient Education  2022 [employer]. https://www.diabeteseducator.org/docs/default-source/living-with-diabetes/conquering-the-grocery-store-v1.pdf?sfvrsn=4">  Carbohydrate Counting for Diabetes Mellitus, Adult Carbohydrate counting is a method of keeping track of how many carbohydrates you eat. Eating carbohydrates naturally increases the amount of sugar (glucose) in the blood. Counting how many carbohydrates you eat improves your bloodglucose control, which helps you manage your diabetes. It is important to know how many carbohydrates you can safely have in each meal. This is different for every person. A dietitian can help you make a meal plan and calculate how many carbohydrates you should have at each meal andsnack. What foods contain carbohydrates? Carbohydrates are found in the following foods: Grains, such as breads and cereals. Dried beans and soy products. Starchy vegetables, such as potatoes, peas, and corn. Fruit and fruit juices. Milk and yogurt. Sweets and snack foods, such as cake, cookies, candy, chips, and soft drinks. How do I count carbohydrates in foods? There are two ways to count carbohydrates in food. You  can read food labels or learn standard serving sizes of foods. You can use either of the methods or acombination of both. Using the Nutrition Facts label The Nutrition Facts list is included on the labels of almost all packaged foods and beverages in the [place] It includes: The serving size. Information about nutrients in each serving, including the grams (g) of carbohydrate per serving. To use the Nutrition Facts: Decide how many servings you will have. Multiply the number of servings by the number of carbohydrates per serving. The resulting number is the total amount of carbohydrates that you will be having. Learning the standard serving sizes of foods When you eat carbohydrate foods that are not packaged or do not include Nutrition Facts on the label, you need to measure the servings in order to count the amount of carbohydrates. Measure the foods that you will eat with a food scale or measuring cup, if needed. Decide how many standard-size servings you will eat. Multiply  the number of servings by 15. For foods that contain carbohydrates, one serving equals 15 g of carbohydrates. For example, if you eat 2 cups or 10 oz (300 g) of strawberries, you will have eaten 2 servings and 30 g of carbohydrates (2 servings x 15 g = 30 g). For foods that have more than one food mixed, such as soups and casseroles, you must count the carbohydrates in each food that is included. The following list contains standard serving sizes of common carbohydrate-rich foods. Each of these servings has about 15 g of carbohydrates: 1 slice of bread. 1 six-inch (15 cm) tortilla. ? cup or 2 oz (53 g) cooked rice or pasta.  cup or 3 oz (85 g) cooked or canned, drained and rinsed beans or lentils.  cup or 3 oz (85 g) starchy vegetable, such as peas, corn, or squash.  cup or 4 oz (120 g) hot cereal.  cup or 3 oz (85 g) boiled or mashed potatoes, or  or 3 oz (85 g) of a large baked potato.  cup or 4 fl oz (118 mL) fruit  juice. 1 cup or 8 fl oz (237 mL) milk. 1 small or 4 oz (106 g) apple.  or 2 oz (63 g) of a medium banana. 1 cup or 5 oz (150 g) strawberries. 3 cups or 1 oz (24 g) popped popcorn. What is an example of carbohydrate counting? To calculate the number of carbohydrates in this sample meal, follow the stepsshown below. Sample meal 3 oz (85 g) chicken breast. ? cup or 4 oz (106 g) brown rice.  cup or 3 oz (85 g) corn. 1 cup or 8 fl oz (237 mL) milk. 1 cup or 5 oz (150 g) strawberries with sugar-free whipped topping. Carbohydrate calculation Identify the foods that contain carbohydrates: Rice. Corn. Milk. Strawberries. Calculate how many servings you have of each food: 2 servings rice. 1 serving corn. 1 serving milk. 1 serving strawberries. Multiply each number of servings by 15 g: 2 servings rice x 15 g = 30 g. 1 serving corn x 15 g = 15 g. 1 serving milk x 15 g = 15 g. 1 serving strawberries x 15 g = 15 g. Add together all of the amounts to find the total grams of carbohydrates eaten: 30 g + 15 g + 15 g + 15 g = 75 g of carbohydrates total. What are tips for following this plan? Shopping Develop a meal plan and then make a shopping list. Buy fresh and frozen vegetables, fresh and frozen fruit, dairy, eggs, beans, lentils, and whole grains. Look at food labels. Choose foods that have more fiber and less sugar. Avoid processed foods and foods with added sugars. Meal planning Aim to have the same amount of carbohydrates at each meal and for each snack time. Plan to have regular, balanced meals and snacks. Where to find more information American Diabetes Association: www.diabetes.org Centers for Disease Control and Prevention: http://www.wolf.info/ Summary Carbohydrate counting is a method of keeping track of how many carbohydrates you eat. Eating carbohydrates naturally increases the amount of sugar (glucose) in the blood. Counting how many carbohydrates you eat improves your blood  glucose control, which helps you manage your diabetes. A dietitian can help you make a meal plan and calculate how many carbohydrates you should have at each meal and snack. This information is not intended to replace advice given to you by your health care provider. Make sure you discuss any questions you have with your  healthcare provider. Document Revised: 02/24/2019 Document Reviewed: 02/25/2019 Elsevier Patient Education  2021 [employer].

## 2020-11-23 ENCOUNTER — Telehealth: Payer: Self-pay

## 2020-11-23 NOTE — Telephone Encounter (Signed)
  Care Management   Follow Up Note   11/23/2020 Name: SALOMA NIGRO MRN: [id number] DOB: June 12, 1970   Referred by: Jon Billings, NP Reason for referral : Care Coordination (RNCM: Follow up for Chronic Disease Management and Care Coordination Needs )   An unsuccessful telephone outreach was attempted today. The patient was referred to the case management team for assistance with care management and care coordination.   Follow Up Plan: A HIPPA compliant phone message was left for the patient providing contact information and requesting a return call.   Noreene Larsson RN, MSN, [facility] Mobile: [phone]

## 2020-11-30 ENCOUNTER — Telehealth: Payer: Self-pay

## 2020-11-30 NOTE — Chronic Care Management (AMB) (Signed)
  Care Management   Note  11/30/2020 Name: SELEN SMUCKER MRN: [id number] DOB: 10-Mar-1971  [name] is a 50 y.o. year old female who is a primary care patient of Jon Billings, NP and is actively engaged with the care management team. I reached out to [name] by phone today to assist with re-scheduling a follow up visit with the RN Case Manager  Follow up plan: Unsuccessful telephone outreach attempt made. A HIPAA compliant phone message was left for the patient providing contact information and requesting a return call.  The care management team will reach out to the patient again over the next 7 days.  If patient returns call to provider office, please advise to call [facility]  at [place], [place], [place], [place] [address] Direct Dial: [phone] [email or web address] Website: [email or web address]

## 2020-12-10 NOTE — Chronic Care Management (AMB) (Signed)
  Care Management   Note  12/10/2020 Name: CHENELL LOZON MRN: [id number] DOB: [birth year]  [name] is a 50 y.o. year old female who is a primary care patient of Jon Billings, NP and is actively engaged with the care management team. I reached out to [name] by phone today to assist with re-scheduling a follow up visit with the RN Case Manager  Follow up plan: Unsuccessful telephone outreach attempt made. A HIPAA compliant phone message was left for the patient providing contact information and requesting a return call.  The care management team will reach out to the patient again over the next 7 days.  If patient returns call to provider office, please advise to call [facility]  at [place], [place], [place],  [address] Direct Dial: [phone] [email or web address]: [email or web address]

## 2020-12-17 ENCOUNTER — Ambulatory Visit: Payer: Self-pay | Admitting: Nurse Practitioner

## 2020-12-17 NOTE — Progress Notes (Deleted)
LMP  (LMP Unknown)    Subjective:    Patient ID: Holly Norman, female    DOB: Mar 11, 1970, 50 y.o.   MRN: [id number]  HPI: ARMYA Norman is a 50 y.o. female  No chief complaint on file.  HYPERTENSION / HYPERLIPIDEMIA Satisfied with current treatment? {Blank single:19197::"yes","no"} Duration of hypertension: {Blank single:19197::"chronic","months","years"} BP monitoring frequency: {Blank single:19197::"not checking","rarely","daily","weekly","monthly","a few times a day","a few times a week","a few times a month"} BP range:  BP medication side effects: {Blank single:19197::"yes","no"} Past BP meds: {Blank EXBMWUXL:24401::"UUVO","ZDGUYQIHKV","QQVZDGLOVF/IEPPIRJJOA","CZYSAYTK","ZSWFUXNATF","TDDUKGURKY/HCWC","BJSEGBTDVV (bystolic)","carvedilol","chlorthalidone","clonidine","diltiazem","exforge HCT","HCTZ","irbesartan (avapro)","labetalol","lisinopril","lisinopril-HCTZ","losartan (cozaar)","methyldopa","nifedipine","olmesartan (benicar)","olmesartan-HCTZ","quinapril","ramipril","spironalactone","tekturna","valsartan","valsartan-HCTZ","verapamil"} Duration of hyperlipidemia: {Blank single:19197::"chronic","months","years"} Cholesterol medication side effects: {Blank single:19197::"yes","no"} Cholesterol supplements: {Blank multiple:19196::"none","fish oil","niacin","red yeast rice"} Past cholesterol medications: {Blank multiple:19196::"none","atorvastain (lipitor)","lovastatin (mevacor)","pravastatin (pravachol)","rosuvastatin (crestor)","simvastatin (zocor)","vytorin","fenofibrate (tricor)","gemfibrozil","ezetimide (zetia)","niaspan","lovaza"} Medication compliance: {Blank single:19197::"excellent compliance","good compliance","fair compliance","poor compliance"} Aspirin: {Blank single:19197::"yes","no"} Recent stressors: {Blank single:19197::"yes","no"} Recurrent headaches: {Blank single:19197::"yes","no"} Visual changes: {Blank single:19197::"yes","no"} Palpitations: {Blank  single:19197::"yes","no"} Dyspnea: {Blank single:19197::"yes","no"} Chest pain: {Blank single:19197::"yes","no"} Lower extremity edema: {Blank single:19197::"yes","no"} Dizzy/lightheaded: {Blank single:19197::"yes","no"}  DIABETES Hypoglycemic episodes:{Blank single:19197::"yes","no"} Polydipsia/polyuria: {Blank single:19197::"yes","no"} Visual disturbance: {Blank single:19197::"yes","no"} Chest pain: {Blank single:19197::"yes","no"} Paresthesias: {Blank single:19197::"yes","no"} Glucose Monitoring: {Blank single:19197::"yes","no"}  Accucheck frequency: {Blank single:19197::"Not Checking","Daily","BID","TID"}  Fasting glucose:  Post prandial:  Evening:  Before meals: Taking Insulin?: {Blank single:19197::"yes","no"}  Long acting insulin:  Short acting insulin: Blood Pressure Monitoring: {Blank single:19197::"not checking","rarely","daily","weekly","monthly","a few times a day","a few times a week","a few times a month"} Retinal Examination: {Blank single:19197::"Up to Date","Not up to Date"} Foot Exam: {Blank single:19197::"Up to Date","Not up to Date"} Diabetic Education: {Blank single:19197::"Completed","Not Completed"} Pneumovax: {Blank single:19197::"Up to Date","Not up to Date","unknown"} Influenza: {Blank single:19197::"Up to Date","Not up to Date","unknown"} Aspirin: {Blank single:19197::"yes","no"}   DEPRESSION/ANXIETY  Relevant past medical, surgical, family and social history reviewed and updated as indicated. Interim medical history since our last visit reviewed. Allergies and medications reviewed and updated.  Review of Systems  Per HPI unless specifically indicated above     Objective:    LMP  (LMP Unknown)   Wt Readings from Last 3 Encounters:  04/03/20 196 lb 4 oz (89 kg)  08/04/19 209 lb (94.8 kg)  03/23/19 211 lb (95.7 kg)    Physical Exam  Results for orders placed or performed in visit on 09/14/20  HgB A1c  Result Value Ref Range   Hgb A1c MFr  Bld 7.0 (H) 4.8 - 5.6 %   Est. average glucose Bld gHb Est-mCnc 154 mg/dL  Comp Met (CMET)  Result Value Ref Range   Glucose 195 (H) 65 - 99 mg/dL   BUN 18 6 - 24 mg/dL   Creatinine, Ser 0.88 0.57 - 1.00 mg/dL   eGFR 80 >59 mL/min/1.73   BUN/Creatinine Ratio 20 9 - 23   Sodium 141 134 - 144 mmol/L   Potassium 4.1 3.5 - 5.2 mmol/L   Chloride 102 96 - 106 mmol/L   CO2 26 20 - 29 mmol/L   Calcium 9.8 8.7 - 10.2 mg/dL   Total Protein 7.1 6.0 - 8.5 g/dL   Albumin 4.6 3.8 - 4.8 g/dL   Globulin, Total 2.5 1.5 - 4.5 g/dL   Albumin/Globulin Ratio 1.8 1.2 - 2.2   Bilirubin Total 0.6 0.0 - 1.2 mg/dL   Alkaline Phosphatase 69 44 - 121 IU/L   AST 28 0 - 40 IU/L   ALT 29 0 - 32 IU/L  Lipid Profile  Result Value Ref Range   Cholesterol, Total 242 (H) 100 - 199 mg/dL   Triglycerides 430 (H) 0 - 149 mg/dL   HDL 41 >39 mg/dL  VLDL Cholesterol Cal 77 (H) 5 - 40 mg/dL   LDL Chol Calc (NIH) 124 (H) 0 - 99 mg/dL   Chol/HDL Ratio 5.9 (H) 0.0 - 4.4 ratio      Assessment & Plan:   Problem List Items Addressed This Visit       Endocrine   Diabetes ([place])     Genitourinary   CKD (chronic kidney disease) - Primary     Other   Hyperlipidemia   Depression     Follow up plan: No follow-ups on file.

## 2020-12-24 ENCOUNTER — Other Ambulatory Visit: Payer: Self-pay | Admitting: Nurse Practitioner

## 2020-12-25 NOTE — Telephone Encounter (Signed)
Requested medications are due for refill today NO  Requested medications are on the active medication list NO  Last refill 08/05/20  Last visit 09/14/20  Future visit scheduled NO SHOW 12/17/20  Notes to clinic med not on current med list, please assess.  Requested Prescriptions  Pending Prescriptions Disp Refills   chlorthalidone (HYGROTON) 25 MG tablet [Pharmacy Med Name: Chlorthalidone 25 MG Oral Tablet] 45 tablet 0    Sig: Take 1/2 (one-half) tablet by mouth once daily     Cardiovascular: Diuretics - Thiazide Failed - 12/24/2020  8:21 PM      Failed - Last BP in normal range    BP Readings from Last 1 Encounters:  09/14/20 (!) 137/92          Passed - Ca in normal range and within 360 days    Calcium  Date Value Ref Range Status  09/14/2020 9.8 8.7 - 10.2 mg/dL Final          Passed - Cr in normal range and within 360 days    Creatinine, Ser  Date Value Ref Range Status  09/14/2020 0.88 0.57 - 1.00 mg/dL Final          Passed - K in normal range and within 360 days    Potassium  Date Value Ref Range Status  09/14/2020 4.1 3.5 - 5.2 mmol/L Final          Passed - Na in normal range and within 360 days    Sodium  Date Value Ref Range Status  09/14/2020 141 134 - 144 mmol/L Final          Passed - Valid encounter within last 6 months    Recent Outpatient Visits           3 months ago Type 2 diabetes mellitus with chronic kidney disease, without long-term current use of insulin, unspecified CKD stage ([place])   [place] Jon Billings, NP   8 months ago Type 2 diabetes mellitus with chronic kidney disease, without long-term current use of insulin, unspecified CKD stage [facility])   [facility] Myles Gip, DO   1 year ago Type 2 diabetes mellitus with chronic kidney disease, without long-term current use of insulin, unspecified CKD stage [facility])   [place], Lilia Argue, PA-C   1 year ago Type 2 diabetes  mellitus with chronic kidney disease, without long-term current use of insulin, unspecified CKD stage [facility])   [place], [place], PA-C   1 year ago Type 2 diabetes mellitus with chronic kidney disease, without long-term current use of insulin, unspecified CKD stage [facility])   [facility] Volney American, [place]

## 2020-12-25 NOTE — Chronic Care Management (AMB) (Signed)
  Care Management   Note  12/25/2020 Name: SAFAA STINGLEY MRN: [id number] DOB: 15-Oct-1970  Holly Norman is a 50 y.o. year old female who is a primary care patient of Jon Billings, NP and is actively engaged with the care management team. I reached out to Holly Norman by phone today to assist with re-scheduling a follow up visit with the RN Case Manager  Follow up plan: Unable to make contact on outreach attempts x 3. PCP Jon Billings, NP notified via routed documentation in medical record.   Noreene Larsson, [place], [place], [place] [address] Direct Dial: [phone] [email or web address] Website: [email or web address]

## 2020-12-25 NOTE — Telephone Encounter (Signed)
3rd unsuccessful outreach  

## 2021-01-29 ENCOUNTER — Emergency Department: Payer: Self-pay

## 2021-01-29 ENCOUNTER — Other Ambulatory Visit: Payer: Self-pay

## 2021-01-29 ENCOUNTER — Emergency Department
Admission: EM | Admit: 2021-01-29 | Discharge: 2021-01-29 | Disposition: A | Discharge status: Home / Self Care | Payer: Self-pay | Attending: Emergency Medicine | Admitting: Emergency Medicine

## 2021-01-29 DIAGNOSIS — Z7984 Long term (current) use of oral hypoglycemic drugs: Secondary | ICD-10-CM | POA: Insufficient documentation

## 2021-01-29 DIAGNOSIS — Y92197 Garden or yard of other specified residential institution as the place of occurrence of the external cause: Secondary | ICD-10-CM | POA: Insufficient documentation

## 2021-01-29 DIAGNOSIS — I129 Hypertensive chronic kidney disease with stage 1 through stage 4 chronic kidney disease, or unspecified chronic kidney disease: Secondary | ICD-10-CM | POA: Insufficient documentation

## 2021-01-29 DIAGNOSIS — W540XXA Bitten by dog, initial encounter: Secondary | ICD-10-CM

## 2021-01-29 DIAGNOSIS — E1122 Type 2 diabetes mellitus with diabetic chronic kidney disease: Secondary | ICD-10-CM | POA: Insufficient documentation

## 2021-01-29 DIAGNOSIS — N189 Chronic kidney disease, unspecified: Secondary | ICD-10-CM | POA: Insufficient documentation

## 2021-01-29 DIAGNOSIS — Z79899 Other long term (current) drug therapy: Secondary | ICD-10-CM | POA: Insufficient documentation

## 2021-01-29 DIAGNOSIS — S51831A Puncture wound without foreign body of right forearm, initial encounter: Secondary | ICD-10-CM | POA: Insufficient documentation

## 2021-01-29 MED ORDER — AMOXICILLIN-POT CLAVULANATE 875-125 MG PO TABS: 1.0000 | ORAL_TABLET | Freq: Two times a day (BID) | ORAL | 0 refills | Status: AC

## 2021-01-29 MED ORDER — LIDOCAINE HCL (PF) 1 % IJ SOLN
5.0000 mL | Freq: Once | INTRAMUSCULAR | Status: AC
  Administered 2021-01-29: 5 mL
  Filled 2021-01-29: qty 5

## 2021-01-29 NOTE — ED Notes (Signed)
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 Right arm below elbow swollen and red with 2 dog bite areas. Able to move all fingers. States just sore. No pain. 0

## 2021-01-29 NOTE — ED Triage Notes (Signed)
Dog bite to right forearm this morning.    Bite occurred at

## 2021-01-29 NOTE — Discharge Instructions (Addendum)
Keep the wound clean and dry.  You can put bacitracin or Neosporin or other antibiotic ointment on the wounds.  Have the stitches out in about 10 days.  If any signs of infection like redness swelling or pus or increased pain develop please return for check.  Take the Augmentin 1 pill twice a day with food to help prevent infection.

## 2021-01-29 NOTE — ED Notes (Signed)
This RN reviewed paperwork with pt. No further complaints or questions. Pt ambulated to lobby. Discharge form placed in med rec box.  

## 2021-01-29 NOTE — ED Notes (Signed)
[place] regarding dog bite. Stated would come an take report.

## 2021-01-29 NOTE — ED Provider Notes (Signed)
[facility] Emergency Department Provider Note  ____________________________________________   Event Date/Time   First MD Initiated Contact with Patient 01/29/21 1052     (approximate)  I have reviewed the triage vital signs and the nursing notes.   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  She went outside to get some boxes and the dog that was in the yard and bit her forearm.  There is 1 set of 6 abrasions which are very superficial and 2 small puncture wounds on the right forearm.  There is some fat sticking out of the lower 1.  Each puncture wound is approximately half a centimeter in length.  Dog is up-to-date on its shots.  She is up-to-date on her tetanus.  The dog is a inside her ER dog.  There is no numbness or weakness of the fingers or hand.         Past Medical History:  Diagnosis Date   Diabetes mellitus without complication ([place])    Hyperlipidemia    Hypertension     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   Diabetes ([place]) 10/24/2014   Depression 10/24/2014   CKD (chronic kidney disease) 10/24/2014   Restless legs 10/24/2014    Past Surgical History:  Procedure Laterality Date   ABDOMINAL HYSTERECTOMY     CARDIAC ELECTROPHYSIOLOGY STUDY AND ABLATION     CESAREAN SECTION     rt ankle surgery      Prior to Admission medications   Medication Sig Start Date End Date Taking? Authorizing Provider  amoxicillin-clavulanate (AUGMENTIN) 875-125 MG tablet Take 1 tablet by mouth 2 (two) times daily for 10 days. 01/29/21 02/08/21 Yes Nena Polio, MD  atenolol (TENORMIN) 50 MG tablet Take 1 tablet (50 mg total) by mouth daily. 09/14/20   Jon Billings, NP  atorvastatin (LIPITOR) 80 MG tablet Take 1 tablet (80 mg total) by mouth  daily. 09/14/20   Jon Billings, NP  ezetimibe (ZETIA) 10 MG tablet Take 1 tablet (10 mg total) by mouth daily. 04/04/20   Myles Gip, DO  fenofibrate (TRICOR) 145 MG tablet Take 1 tablet (145 mg total) by mouth daily. 09/17/20   Jon Billings, NP  glipiZIDE (GLUCOTROL) 5 MG tablet Take 1 tablet (5 mg total) by mouth 2 (two) times daily before a meal. 09/17/20   Jon Billings, NP  JARDIANCE 10 MG TABS tablet Take 1 tablet by mouth once daily Patient not taking: Reported on 10/05/2020 08/04/20   Jon Billings, NP  losartan (COZAAR) 25 MG tablet Take 1 tablet (25 mg total) by mouth daily. 09/14/20   Jon Billings, NP  metFORMIN (GLUCOPHAGE) 1000 MG tablet Take 1 tablet (1,000 mg total) by mouth 2 (two) times daily with a meal. 09/17/20   Jon Billings, NP  Omega-3 Fatty Acids (FISH OIL) 1000 MG CAPS Take 1,000 mg by mouth daily.    [provider]  OZEMPIC, 0.25 OR 0.5 MG/DOSE, 2 MG/1.5ML SOPN INJECT 0.5MG  INTO THE SKIN ONCE A WEEK Patient not taking: Reported on 10/05/2020 08/09/20   Jon Billings, NP  sertraline (ZOLOFT) 50 MG tablet Take 1 tablet (50 mg total) by mouth daily. 09/14/20   Jon Billings, NP    Allergies Ivp dye [iodinated diagnostic agents]  Family History  Problem Relation  Age of Onset   Diabetes Mother    Heart disease Mother    Hypertension Mother    Heart disease Father 74   Diabetes Father    Hypertension Father    Stroke Father    Cancer Maternal Grandmother        breast and colon   Breast cancer Maternal Grandmother 32   Heart disease Maternal Grandfather    Heart disease Paternal Grandfather     Social History Social History   Tobacco Use   Smoking status: Never   Smokeless tobacco: Never  Vaping Use   Vaping Use: Never used  Substance Use Topics   Alcohol use: No    Alcohol/week: 0.0 standard drinks   Drug use: No    Review of Systems  Constitutional: No fever/chills Eyes: No visual changes. ENT: No sore  throat. Cardiovascular: Denies chest pain. Respiratory: Denies shortness of breath. Gastrointestinal: No abdominal pain.  No nausea, no vomiting.  No diarrhea.  No constipation. Genitourinary: Negative for dysuria. Musculoskeletal: Negative for back pain. Skin: Negative for rash. Neurological: Negative for headaches, focal weakness   ____________________________________________   PHYSICAL EXAM:  VITAL SIGNS: ED Triage Vitals  Enc Vitals Group     BP 01/29/21 1027 (!) 160/95     Pulse Rate 01/29/21 1025 76     Resp 01/29/21 1025 18     Temp 01/29/21 1025 98 F (36.7 C)     Temp Source 01/29/21 1025 Oral     SpO2 01/29/21 1025 94 %     Weight 01/29/21 1027 214 lb (97.1 kg)     Height 01/29/21 1026 5\' 2"  (1.575 m)     Head Circumference --      Peak Flow --      Pain Score 01/29/21 1026 0     Pain Loc --      Pain Edu? --      Excl. in [place]? --     Constitutional: Alert and oriented. Well appearing and in no acute distress. Eyes: Conjunctivae are normal.  Head: Atraumatic. Nose: No congestion/rhinnorhea. Mouth/Throat: Mucous membranes are moist.  Neck: No stridor.  Cardiovascular:  Good peripheral circulation. Respiratory: Normal respiratory effort.  No retractions.  Musculoskeletal: No lower extremity tenderness nor edema.  Forearm as described in HPI.  There is some swelling around the upper puncture wound Neurologic:  Normal speech and language. No gross focal neurologic deficits are appreciated.  Skin:  Skin is warm, dry and intact except as noted in HPI. No rash noted.   ____________________________________________   LABS (all labs ordered are listed, but only abnormal results are displayed)  Labs Reviewed - No data to display ____________________________________________  EKG   ____________________________________________  RADIOLOGY Gertha Calkin, personally viewed and evaluated these images (plain radiographs) as part of my medical decision making,  as well as reviewing the written report by the radiologist.  ED MD interpretation: X-rays reviewed by me do not show any foreign bodies.  There is some air in the soft tissue after the bite.  Official radiology report(s): DG Forearm Right  Result Date: 01/29/2021 CLINICAL DATA:  Foreign body concern following dog bite. EXAM: RIGHT FOREARM - 2 VIEW COMPARISON:  None FINDINGS: Gas in the soft tissues of the dorsal mid forearm tracking from the proximal third along the middle third of the forearm, associated with soft tissue edema. No signs of radiopaque foreign body in the soft tissues. No sign of fracture or dislocation. IMPRESSION: Soft tissue injury  without signs of fracture or dislocation. No radiopaque foreign body. Electronically Signed   By: Zetta Bills M.D.   On: 01/29/2021 11:32    ____________________________________________   PROCEDURES  Procedure(s) performed (including Critical Care): Wound closed by PA student who has experienced doing so.  Wound was cleaned and irrigated with normal saline and then each of the 2 punctures was closed with 1 stitch of 4-0 nylon.  Wounds look well afterwards.  Each wound was as I mentioned in HPI 1 cm long about 2 mm wide which is well reclosed him.  We elected to use a stitch so the drain wound could drain if need be.  Procedures   ____________________________________________   INITIAL IMPRESSION / ASSESSMENT AND PLAN / ED COURSE  Will give the patient Augmentin for dog bite.  She will return for any signs of infection and have the stitches out in about 10 days.              ____________________________________________   FINAL CLINICAL IMPRESSION(S) / ED DIAGNOSES  Final diagnoses:  Dog bite, initial encounter     ED Discharge Orders          Ordered    amoxicillin-clavulanate (AUGMENTIN) 875-125 MG tablet  2 times daily        01/29/21 1209             Note:  This document was prepared using Dragon voice  recognition software and may include unintentional dictation errors.    Nena Polio, MD 01/29/21 1209

## 2021-02-04 ENCOUNTER — Encounter: Payer: Self-pay | Admitting: Nurse Practitioner

## 2021-02-05 MED ORDER — SERTRALINE HCL 50 MG PO TABS: 50.0000 mg | ORAL_TABLET | Freq: Every day | ORAL | 0 refills | Status: DC

## 2021-02-05 MED ORDER — LOSARTAN POTASSIUM 25 MG PO TABS: 25.0000 mg | ORAL_TABLET | Freq: Every day | ORAL | 0 refills | Status: DC

## 2021-02-05 MED ORDER — ATENOLOL 50 MG PO TABS: 50.0000 mg | ORAL_TABLET | Freq: Every day | ORAL | 0 refills | Status: DC

## 2021-02-06 ENCOUNTER — Encounter: Payer: Self-pay | Admitting: Nurse Practitioner

## 2021-03-28 ENCOUNTER — Ambulatory Visit: Payer: Self-pay | Admitting: Nurse Practitioner

## 2021-04-02 ENCOUNTER — Other Ambulatory Visit: Payer: Self-pay

## 2021-04-02 ENCOUNTER — Encounter: Payer: Self-pay | Admitting: Nurse Practitioner

## 2021-04-02 ENCOUNTER — Ambulatory Visit (INDEPENDENT_AMBULATORY_CARE_PROVIDER_SITE_OTHER): Payer: 59 | Admitting: Nurse Practitioner

## 2021-04-02 VITALS — BP 154/97 | HR 75 | Temp 99.1°F | Ht 62.01 in | Wt 220.2 lb

## 2021-04-02 DIAGNOSIS — Z1211 Encounter for screening for malignant neoplasm of colon: Secondary | ICD-10-CM | POA: Diagnosis not present

## 2021-04-02 DIAGNOSIS — Z1231 Encounter for screening mammogram for malignant neoplasm of breast: Secondary | ICD-10-CM

## 2021-04-02 DIAGNOSIS — F330 Major depressive disorder, recurrent, mild: Secondary | ICD-10-CM | POA: Diagnosis not present

## 2021-04-02 DIAGNOSIS — E782 Mixed hyperlipidemia: Secondary | ICD-10-CM | POA: Diagnosis not present

## 2021-04-02 DIAGNOSIS — N189 Chronic kidney disease, unspecified: Secondary | ICD-10-CM

## 2021-04-02 DIAGNOSIS — E1159 Type 2 diabetes mellitus with other circulatory complications: Secondary | ICD-10-CM | POA: Insufficient documentation

## 2021-04-02 DIAGNOSIS — Z800 Family history of malignant neoplasm of digestive organs: Secondary | ICD-10-CM

## 2021-04-02 DIAGNOSIS — E1122 Type 2 diabetes mellitus with diabetic chronic kidney disease: Secondary | ICD-10-CM

## 2021-04-02 DIAGNOSIS — I10 Essential (primary) hypertension: Secondary | ICD-10-CM | POA: Diagnosis not present

## 2021-04-02 DIAGNOSIS — R69 Illness, unspecified: Secondary | ICD-10-CM | POA: Diagnosis not present

## 2021-04-02 MED ORDER — MECLIZINE HCL 25 MG PO TABS: 25.0000 mg | ORAL_TABLET | Freq: Three times a day (TID) | ORAL | 1 refills | Status: DC | PRN

## 2021-04-02 MED ORDER — FENOFIBRATE 145 MG PO TABS: 145.0000 mg | ORAL_TABLET | Freq: Every day | ORAL | 1 refills | Status: DC

## 2021-04-02 MED ORDER — ATENOLOL 50 MG PO TABS: 50.0000 mg | ORAL_TABLET | Freq: Every day | ORAL | 1 refills | Status: DC

## 2021-04-02 MED ORDER — SERTRALINE HCL 50 MG PO TABS: 50.0000 mg | ORAL_TABLET | Freq: Every day | ORAL | 1 refills | Status: DC

## 2021-04-02 MED ORDER — OZEMPIC (0.25 OR 0.5 MG/DOSE) 2 MG/1.5ML ~~LOC~~ SOPN: PEN_INJECTOR | SUBCUTANEOUS | 1 refills | Status: DC

## 2021-04-02 MED ORDER — ATORVASTATIN CALCIUM 80 MG PO TABS: 80.0000 mg | ORAL_TABLET | Freq: Every day | ORAL | 1 refills | Status: DC

## 2021-04-02 MED ORDER — NA SULFATE-K SULFATE-MG SULF 17.5-3.13-1.6 GM/177ML PO SOLN
1.0000 | Freq: Once | ORAL | 0 refills | Status: AC
Start: 2021-04-02 — End: 2021-04-02

## 2021-04-02 MED ORDER — LOSARTAN POTASSIUM 25 MG PO TABS: 25.0000 mg | ORAL_TABLET | Freq: Every day | ORAL | 1 refills | Status: DC

## 2021-04-02 MED ORDER — METFORMIN HCL 1000 MG PO TABS: 1000.0000 mg | ORAL_TABLET | Freq: Two times a day (BID) | ORAL | 1 refills | Status: DC

## 2021-04-02 MED ORDER — EZETIMIBE 10 MG PO TABS: 10.0000 mg | ORAL_TABLET | Freq: Every day | ORAL | 1 refills | Status: DC

## 2021-04-02 NOTE — Progress Notes (Signed)
Gastroenterology Pre-Procedure Review  Request Date: 05/06/2021 Requesting Physician: Dr. Marius Ditch  PATIENT REVIEW QUESTIONS: The patient responded to the following health history questions as indicated:    1. Are you having any GI issues? no 2. Do you have a personal history of Polyps? no 3. Do you have a family history of Colon Cancer or Polyps? yes (Mother & Sister- polyps; Both Grandmother's- colon cancer) 4. Diabetes Mellitus? yes (Type II) 5. Joint replacements in the past 12 months?no 6. Major health problems in the past 3 months?no 7. Any artificial heart valves, MVP, or defibrillator?no    MEDICATIONS & ALLERGIES:    Patient reports the following regarding taking any anticoagulation/antiplatelet therapy:   Plavix, Coumadin, Eliquis, Xarelto, Lovenox, Pradaxa, Brilinta, or Effient? no Aspirin? no  Patient confirms/reports the following medications:  Current Outpatient Medications  Medication Sig Dispense Refill   atenolol (TENORMIN) 50 MG tablet Take 1 tablet (50 mg total) by mouth daily. 90 tablet 1   atorvastatin (LIPITOR) 80 MG tablet Take 1 tablet (80 mg total) by mouth daily. 90 tablet 1   ezetimibe (ZETIA) 10 MG tablet Take 1 tablet (10 mg total) by mouth daily. 90 tablet 1   fenofibrate (TRICOR) 145 MG tablet Take 1 tablet (145 mg total) by mouth daily. 90 tablet 1   glipiZIDE (GLUCOTROL) 5 MG tablet Take 1 tablet (5 mg total) by mouth 2 (two) times daily before a meal. 60 tablet 3   losartan (COZAAR) 25 MG tablet Take 1 tablet (25 mg total) by mouth daily. 90 tablet 1   meclizine (ANTIVERT) 25 MG tablet Take 1 tablet (25 mg total) by mouth 3 (three) times daily as needed for dizziness. 30 tablet 1   metFORMIN (GLUCOPHAGE) 1000 MG tablet Take 1 tablet (1,000 mg total) by mouth 2 (two) times daily with a meal. 180 tablet 1   Omega-3 Fatty Acids (FISH OIL) 1000 MG CAPS Take 1,000 mg by mouth daily.     Semaglutide,0.25 or 0.5MG /DOS, (OZEMPIC, 0.25 OR 0.5 MG/DOSE,) 2 MG/1.5ML  SOPN Inject 0.25mg  weekly for 4 weeks then increase to 0.5mg  weekly 3 mL 1   sertraline (ZOLOFT) 50 MG tablet Take 1 tablet (50 mg total) by mouth daily. 90 tablet 1   No current facility-administered medications for this visit.    Patient confirms/reports the following allergies:  Allergies  Allergen Reactions   Ivp Dye [Iodinated Contrast Media] Shortness Of Breath   Tape Rash    No orders of the defined types were placed in this encounter.   AUTHORIZATION INFORMATION Primary Insurance: 1D#: Group #:  Secondary Insurance: 1D#: Group #:  SCHEDULE INFORMATION: Date: 05/06/2021 Time: Location: [facility]

## 2021-04-02 NOTE — Assessment & Plan Note (Signed)
Chronic.  Uncontrolled.  Elevated at visit today. Continue with current medication regimen.  Recommend weight loss to help with blood pressure control.  Labs ordered today.  Return to clinic in 1 months for reevaluation.  Will adjust medications at that time if blood pressure medications at that time.  Call sooner if concerns arise.

## 2021-04-02 NOTE — Progress Notes (Signed)
BP (!) 154/97    Pulse 75    Temp 99.1 F (37.3 C) (Oral)    Ht 5' 2.01" (1.575 m)    Wt 220 lb 3.2 oz (99.9 kg)    LMP  (LMP Unknown)    SpO2 98%    BMI 40.26 kg/m    Subjective:    Patient ID: Holly Norman, female    DOB: 1970-06-09, 51 y.o.   MRN: [id number]  HPI: Holly Norman is a 51 y.o. female  Chief Complaint  Patient presents with   Diabetes    HYPERTENSION / HYPERLIPIDEMIA Has gained some of the weight back that she previously lost.  Satisfied with current treatment? yes Duration of hypertension: years BP monitoring frequency: weekly BP range: 120/70 BP medication side effects: no Past BP meds: atenolol and losartan (cozaar) Duration of hyperlipidemia: years Cholesterol medication side effects: no Cholesterol supplements: fish oil Past cholesterol medications: atorvastain (lipitor) Medication compliance: excellent compliance Aspirin: no Recent stressors: no Recurrent headaches: no Visual changes: no Palpitations: no Dyspnea: no Chest pain: no Lower extremity edema: no Dizzy/lightheaded: no  DIABETES Patient would like to go back on Ozempic.  She has been out of insurance so had to stop the medication.  Has not been consistent with the metformin.  Hypoglycemic episodes:no Polydipsia/polyuria: no Visual disturbance: no Chest pain: no Paresthesias: no Glucose Monitoring: no  Accucheck frequency: Not Checking  Fasting glucose:  Post prandial:  Evening:  Before meals: Taking Insulin?: no  Long acting insulin:  Short acting insulin: Blood Pressure Monitoring: weekly Retinal Examination: Not up to Date Foot Exam: Not up to Date Diabetic Education: Not Completed Pneumovax: Not up to Date Influenza: Not up to Date Aspirin: no  DEPRESSION Patient states her mood is well controlled. She denies concerns regarding her mood at visit today.    Relevant past medical, surgical, family and social history reviewed and updated as indicated. Interim  medical history since our last visit reviewed. Allergies and medications reviewed and updated.  Review of Systems  Eyes:  Negative for visual disturbance.  Respiratory:  Negative for cough, chest tightness and shortness of breath.   Cardiovascular:  Negative for chest pain, palpitations and leg swelling.  Endocrine: Negative for polydipsia and polyuria.  Neurological:  Negative for dizziness, numbness and headaches.  Psychiatric/Behavioral:  Negative for dysphoric mood. The patient is not nervous/anxious.    Per HPI unless specifically indicated above     Objective:    BP (!) 154/97    Pulse 75    Temp 99.1 F (37.3 C) (Oral)    Ht 5' 2.01" (1.575 m)    Wt 220 lb 3.2 oz (99.9 kg)    LMP  (LMP Unknown)    SpO2 98%    BMI 40.26 kg/m   Wt Readings from Last 3 Encounters:  04/02/21 220 lb 3.2 oz (99.9 kg)  01/29/21 214 lb (97.1 kg)  04/03/20 196 lb 4 oz (89 kg)    Physical Exam Vitals and nursing note reviewed.  Constitutional:      General: She is not in acute distress.    Appearance: Normal appearance. She is not ill-appearing, toxic-appearing or diaphoretic.  HENT:     Head: Normocephalic.     Right Ear: External ear normal.     Left Ear: External ear normal.     Nose: Nose normal.     Mouth/Throat:     Mouth: Mucous membranes are moist.     Pharynx: Oropharynx is  clear.  Eyes:     General:        Right eye: No discharge.        Left eye: No discharge.     Extraocular Movements: Extraocular movements intact.     Conjunctiva/sclera: Conjunctivae normal.     Pupils: Pupils are equal, round, and reactive to light.  Cardiovascular:     Rate and Rhythm: Normal rate and regular rhythm.     Heart sounds: No murmur heard. Pulmonary:     Effort: Pulmonary effort is normal. No respiratory distress.     Breath sounds: Normal breath sounds. No wheezing or rales.  Musculoskeletal:     Cervical back: Normal range of motion and neck supple.  Skin:    General: Skin is warm and  dry.     Capillary Refill: Capillary refill takes less than 2 seconds.  Neurological:     General: No focal deficit present.     Mental Status: She is alert and oriented to person, place, and time. Mental status is at baseline.  Psychiatric:        Mood and Affect: Mood normal.        Behavior: Behavior normal.        Thought Content: Thought content normal.        Judgment: Judgment normal.    Results for orders placed or performed in visit on 09/14/20  HgB A1c  Result Value Ref Range   Hgb A1c MFr Bld 7.0 (H) 4.8 - 5.6 %   Est. average glucose Bld gHb Est-mCnc 154 mg/dL  Comp Met (CMET)  Result Value Ref Range   Glucose 195 (H) 65 - 99 mg/dL   BUN 18 6 - 24 mg/dL   Creatinine, Ser 0.88 0.57 - 1.00 mg/dL   eGFR 80 >59 mL/min/1.73   BUN/Creatinine Ratio 20 9 - 23   Sodium 141 134 - 144 mmol/L   Potassium 4.1 3.5 - 5.2 mmol/L   Chloride 102 96 - 106 mmol/L   CO2 26 20 - 29 mmol/L   Calcium 9.8 8.7 - 10.2 mg/dL   Total Protein 7.1 6.0 - 8.5 g/dL   Albumin 4.6 3.8 - 4.8 g/dL   Globulin, Total 2.5 1.5 - 4.5 g/dL   Albumin/Globulin Ratio 1.8 1.2 - 2.2   Bilirubin Total 0.6 0.0 - 1.2 mg/dL   Alkaline Phosphatase 69 44 - 121 IU/L   AST 28 0 - 40 IU/L   ALT 29 0 - 32 IU/L  Lipid Profile  Result Value Ref Range   Cholesterol, Total 242 (H) 100 - 199 mg/dL   Triglycerides 430 (H) 0 - 149 mg/dL   HDL 41 >39 mg/dL   VLDL Cholesterol Cal 77 (H) 5 - 40 mg/dL   LDL Chol Calc (NIH) 124 (H) 0 - 99 mg/dL   Chol/HDL Ratio 5.9 (H) 0.0 - 4.4 ratio      Assessment & Plan:   Problem List Items Addressed This Visit       Cardiovascular and Mediastinum   Hypertension    Chronic.  Uncontrolled.  Elevated at visit today. Continue with current medication regimen.  Recommend weight loss to help with blood pressure control.  Labs ordered today.  Return to clinic in 1 months for reevaluation.  Will adjust medications at that time if blood pressure medications at that time.  Call sooner if  concerns arise.        Relevant Medications   atenolol (TENORMIN) 50 MG tablet   atorvastatin (LIPITOR)  80 MG tablet   ezetimibe (ZETIA) 10 MG tablet   fenofibrate (TRICOR) 145 MG tablet   losartan (COZAAR) 25 MG tablet     Endocrine   Diabetes ([place]) - Primary    Labs ordered today. Restarted Ozempic. Sample given.  Patient now has insurance.  Will also continue Metformin.  Will make further recommendations based on lab results. Follow up in 1 month for reevaluation.       Relevant Medications   Semaglutide,0.25 or 0.5MG/DOS, (OZEMPIC, 0.25 OR 0.5 MG/DOSE,) 2 MG/1.5ML SOPN   atorvastatin (LIPITOR) 80 MG tablet   fenofibrate (TRICOR) 145 MG tablet   losartan (COZAAR) 25 MG tablet   metFORMIN (GLUCOPHAGE) 1000 MG tablet   Other Relevant Orders   HgB A1c     Genitourinary   CKD (chronic kidney disease)    Labs ordered today. Will make recommendations based on lab results.         Other   Hyperlipidemia    Chronic.  Controlled.  Continue with current medication regimen.  Labs ordered today.  Return to clinic in 6 months for reevaluation.  Call sooner if concerns arise.        Relevant Medications   atenolol (TENORMIN) 50 MG tablet   atorvastatin (LIPITOR) 80 MG tablet   ezetimibe (ZETIA) 10 MG tablet   fenofibrate (TRICOR) 145 MG tablet   losartan (COZAAR) 25 MG tablet   metFORMIN (GLUCOPHAGE) 1000 MG tablet   Other Relevant Orders   Lipid Profile   Depression    Chronic.  Controlled.  Continue with current medication regimen on Zoloft 84m daily.  Labs ordered today.  Return to clinic in 6 months for reevaluation.  Call sooner if concerns arise.        Relevant Medications   sertraline (ZOLOFT) 50 MG tablet   Other Visit Diagnoses     Essential hypertension       Relevant Medications   atenolol (TENORMIN) 50 MG tablet   atorvastatin (LIPITOR) 80 MG tablet   ezetimibe (ZETIA) 10 MG tablet   fenofibrate (TRICOR) 145 MG tablet   losartan (COZAAR) 25 MG tablet    Other Relevant Orders   Comp Met (CMET)   Screening for colon cancer       Relevant Orders   Ambulatory referral to Gastroenterology   Encounter for screening mammogram for malignant neoplasm of breast       Relevant Orders   MM DIAG BREAST TOMO BILATERAL        Follow up plan: Return in about 1 month (around 05/03/2021) for HTN, HLD, DM2 FU.

## 2021-04-02 NOTE — Assessment & Plan Note (Signed)
Labs ordered today.  Will make recommendations based on lab results. ?

## 2021-04-02 NOTE — Assessment & Plan Note (Signed)
Labs ordered today. Restarted Ozempic. Sample given.  Patient now has insurance.  Will also continue Metformin.  Will make further recommendations based on lab results. Follow up in 1 month for reevaluation.

## 2021-04-02 NOTE — Assessment & Plan Note (Signed)
Chronic.  Controlled.  Continue with current medication regimen on Zoloft 50mg  daily.  Labs ordered today.  Return to clinic in 6 months for reevaluation.  Call sooner if concerns arise.

## 2021-04-02 NOTE — Assessment & Plan Note (Signed)
Chronic.  Controlled.  Continue with current medication regimen.  Labs ordered today.  Return to clinic in 6 months for reevaluation.  Call sooner if concerns arise.  ? ?

## 2021-04-03 LAB — HEMOGLOBIN A1C
Est. average glucose Bld gHb Est-mCnc: 214 mg/dL
Hgb A1c MFr Bld: 9.1 % — ABNORMAL HIGH (ref 4.8–5.6)

## 2021-04-03 LAB — COMPREHENSIVE METABOLIC PANEL
ALT: 27 IU/L (ref 0–32)
AST: 23 IU/L (ref 0–40)
Albumin/Globulin Ratio: 1.8 (ref 1.2–2.2)
Albumin: 4.2 g/dL (ref 3.8–4.9)
Alkaline Phosphatase: 72 IU/L (ref 44–121)
BUN/Creatinine Ratio: 20 (ref 9–23)
BUN: 15 mg/dL (ref 6–24)
Bilirubin Total: 0.9 mg/dL (ref 0.0–1.2)
CO2: 24 mmol/L (ref 20–29)
Calcium: 9.3 mg/dL (ref 8.7–10.2)
Chloride: 105 mmol/L (ref 96–106)
Creatinine, Ser: 0.74 mg/dL (ref 0.57–1.00)
Globulin, Total: 2.4 g/dL (ref 1.5–4.5)
Glucose: 193 mg/dL — ABNORMAL HIGH (ref 70–99)
Potassium: 4.7 mmol/L (ref 3.5–5.2)
Sodium: 142 mmol/L (ref 134–144)
Total Protein: 6.6 g/dL (ref 6.0–8.5)
eGFR: 98 mL/min/{1.73_m2} (ref >59)

## 2021-04-03 LAB — LIPID PANEL
Chol/HDL Ratio: 5 ratio — ABNORMAL HIGH (ref 0.0–4.4)
Cholesterol, Total: 205 mg/dL — ABNORMAL HIGH (ref 100–199)
HDL: 41 mg/dL (ref >39)
LDL Chol Calc (NIH): 122 mg/dL — ABNORMAL HIGH (ref 0–99)
Triglycerides: 241 mg/dL — ABNORMAL HIGH (ref 0–149)
VLDL Cholesterol Cal: 42 mg/dL — ABNORMAL HIGH (ref 5–40)

## 2021-04-03 NOTE — Progress Notes (Signed)
HI Holly Norman.  Your lab work shows that your A1c is elevated to 9.1 as expected.  Lets conitnue with our plan to add back the Ozempic and work on diet and exercise. Your cholesterol is also elevated.  Continue with the plan as discussed during our visit.  Please let me know if you have any questions.

## 2021-04-11 ENCOUNTER — Other Ambulatory Visit: Payer: Self-pay | Admitting: Nurse Practitioner

## 2021-04-11 DIAGNOSIS — Z1231 Encounter for screening mammogram for malignant neoplasm of breast: Secondary | ICD-10-CM

## 2021-04-11 DIAGNOSIS — D241 Benign neoplasm of right breast: Secondary | ICD-10-CM

## 2021-05-03 ENCOUNTER — Encounter: Payer: Self-pay | Admitting: Gastroenterology

## 2021-05-03 ENCOUNTER — Ambulatory Visit: Payer: 59 | Admitting: Nurse Practitioner

## 2021-05-03 NOTE — Progress Notes (Unsigned)
LMP  (LMP Unknown)    Subjective:    Patient ID: Holly Norman, female    DOB: 11/11/1970, 51 y.o.   MRN: [id number]  HPI: Holly Norman is a 51 y.o. female  No chief complaint on file.   HYPERTENSION / HYPERLIPIDEMIA Has gained some of the weight back that she previously lost.  Satisfied with current treatment? yes Duration of hypertension: years BP monitoring frequency: weekly BP range: 120/70 BP medication side effects: no Past BP meds: atenolol and losartan (cozaar) Duration of hyperlipidemia: years Cholesterol medication side effects: no Cholesterol supplements: fish oil Past cholesterol medications: atorvastain (lipitor) Medication compliance: excellent compliance Aspirin: no Recent stressors: no Recurrent headaches: no Visual changes: no Palpitations: no Dyspnea: no Chest pain: no Lower extremity edema: no Dizzy/lightheaded: no  DIABETES Patient would like to go back on Ozempic.  She has been out of insurance so had to stop the medication.  Has not been consistent with the metformin.  Hypoglycemic episodes:no Polydipsia/polyuria: no Visual disturbance: no Chest pain: no Paresthesias: no Glucose Monitoring: no  Accucheck frequency: Not Checking  Fasting glucose:  Post prandial:  Evening:  Before meals: Taking Insulin?: no  Long acting insulin:  Short acting insulin: Blood Pressure Monitoring: weekly Retinal Examination: Not up to Date Foot Exam: Not up to Date Diabetic Education: Not Completed Pneumovax: Not up to Date Influenza: Not up to Date Aspirin: no  DEPRESSION Patient states her mood is well controlled. She denies concerns regarding her mood at visit today.    Relevant past medical, surgical, family and social history reviewed and updated as indicated. Interim medical history since our last visit reviewed. Allergies and medications reviewed and updated.  Review of Systems  Eyes:  Negative for visual disturbance.  Respiratory:   Negative for cough, chest tightness and shortness of breath.   Cardiovascular:  Negative for chest pain, palpitations and leg swelling.  Endocrine: Negative for polydipsia and polyuria.  Neurological:  Negative for dizziness, numbness and headaches.  Psychiatric/Behavioral:  Negative for dysphoric mood. The patient is not nervous/anxious.    Per HPI unless specifically indicated above     Objective:    LMP  (LMP Unknown)   Wt Readings from Last 3 Encounters:  04/02/21 220 lb 3.2 oz (99.9 kg)  01/29/21 214 lb (97.1 kg)  04/03/20 196 lb 4 oz (89 kg)    Physical Exam Vitals and nursing note reviewed.  Constitutional:      General: She is not in acute distress.    Appearance: Normal appearance. She is not ill-appearing, toxic-appearing or diaphoretic.  HENT:     Head: Normocephalic.     Right Ear: External ear normal.     Left Ear: External ear normal.     Nose: Nose normal.     Mouth/Throat:     Mouth: Mucous membranes are moist.     Pharynx: Oropharynx is clear.  Eyes:     General:        Right eye: No discharge.        Left eye: No discharge.     Extraocular Movements: Extraocular movements intact.     Conjunctiva/sclera: Conjunctivae normal.     Pupils: Pupils are equal, round, and reactive to light.  Cardiovascular:     Rate and Rhythm: Normal rate and regular rhythm.     Heart sounds: No murmur heard. Pulmonary:     Effort: Pulmonary effort is normal. No respiratory distress.     Breath sounds: Normal breath sounds.  No wheezing or rales.  Musculoskeletal:     Cervical back: Normal range of motion and neck supple.  Skin:    General: Skin is warm and dry.     Capillary Refill: Capillary refill takes less than 2 seconds.  Neurological:     General: No focal deficit present.     Mental Status: She is alert and oriented to person, place, and time. Mental status is at baseline.  Psychiatric:        Mood and Affect: Mood normal.        Behavior: Behavior normal.         Thought Content: Thought content normal.        Judgment: Judgment normal.    Results for orders placed or performed in visit on 04/02/21  Comp Met (CMET)  Result Value Ref Range   Glucose 193 (H) 70 - 99 mg/dL   BUN 15 6 - 24 mg/dL   Creatinine, Ser 0.74 0.57 - 1.00 mg/dL   eGFR 98 >59 mL/min/1.73   BUN/Creatinine Ratio 20 9 - 23   Sodium 142 134 - 144 mmol/L   Potassium 4.7 3.5 - 5.2 mmol/L   Chloride 105 96 - 106 mmol/L   CO2 24 20 - 29 mmol/L   Calcium 9.3 8.7 - 10.2 mg/dL   Total Protein 6.6 6.0 - 8.5 g/dL   Albumin 4.2 3.8 - 4.9 g/dL   Globulin, Total 2.4 1.5 - 4.5 g/dL   Albumin/Globulin Ratio 1.8 1.2 - 2.2   Bilirubin Total 0.9 0.0 - 1.2 mg/dL   Alkaline Phosphatase 72 44 - 121 IU/L   AST 23 0 - 40 IU/L   ALT 27 0 - 32 IU/L  Lipid Profile  Result Value Ref Range   Cholesterol, Total 205 (H) 100 - 199 mg/dL   Triglycerides 241 (H) 0 - 149 mg/dL   HDL 41 >39 mg/dL   VLDL Cholesterol Cal 42 (H) 5 - 40 mg/dL   LDL Chol Calc (NIH) 122 (H) 0 - 99 mg/dL   Chol/HDL Ratio 5.0 (H) 0.0 - 4.4 ratio  HgB A1c  Result Value Ref Range   Hgb A1c MFr Bld 9.1 (H) 4.8 - 5.6 %   Est. average glucose Bld gHb Est-mCnc 214 mg/dL      Assessment & Plan:   Problem List Items Addressed This Visit   None    Follow up plan: No follow-ups on file.

## 2021-05-06 ENCOUNTER — Telehealth: Payer: Self-pay

## 2021-05-06 ENCOUNTER — Encounter: Admission: RE | Payer: Self-pay | Source: Ambulatory Visit

## 2021-05-06 ENCOUNTER — Ambulatory Visit: Admission: RE | Admit: 2021-05-06 | Payer: 59 | Source: Ambulatory Visit | Admitting: Gastroenterology

## 2021-05-06 SURGERY — COLONOSCOPY WITH PROPOFOL
Anesthesia: General

## 2021-05-06 NOTE — Telephone Encounter (Signed)
PA for Ozempic initiated and submitted via Cover My Meds. Key: [id number]

## 2021-05-14 MED ORDER — TRULICITY 0.75 MG/0.5ML ~~LOC~~ SOAJ: 0.7500 mg | SUBCUTANEOUS | 0 refills | Status: DC

## 2021-05-14 NOTE — Telephone Encounter (Signed)
Left a message for a patient to give our office a call back to give patient Holly Norman's recommendations.  ? ?OK for PEC/Nurse Triage to give note if patient calls back. ?

## 2021-05-14 NOTE — Telephone Encounter (Signed)
Pelase let patient know that her insurance will not cover ozempic but they will cover trulity.  I have sent it to the pharmacy for her.  We will start at .'75mg'$  weekly and titrate up as she tolerates the medication. Please let me know if she has any questions.  ?

## 2021-05-15 NOTE — Telephone Encounter (Signed)
Called and LVM asking for patient to please return my call.  ? ?OK for PEC to give message from Santiago Glad to the patient.  ?

## 2021-05-17 NOTE — Telephone Encounter (Signed)
Spoke with patient to provide her Karen's recommendations. Patient states she returned the call to our office yesterday evening and spoke with a clinical specialist and she was given the message. Patient declined having any questions. Patient verbalized understanding.  ?

## 2021-07-18 ENCOUNTER — Ambulatory Visit: Payer: Self-pay

## 2021-07-18 ENCOUNTER — Encounter: Payer: Self-pay | Admitting: Nurse Practitioner

## 2021-07-18 ENCOUNTER — Ambulatory Visit (INDEPENDENT_AMBULATORY_CARE_PROVIDER_SITE_OTHER): Payer: 59 | Admitting: Nurse Practitioner

## 2021-07-18 VITALS — BP 170/102 | HR 83 | Temp 98.8°F | Wt 210.2 lb

## 2021-07-18 DIAGNOSIS — R8279 Other abnormal findings on microbiological examination of urine: Secondary | ICD-10-CM

## 2021-07-18 DIAGNOSIS — R300 Dysuria: Secondary | ICD-10-CM | POA: Diagnosis not present

## 2021-07-18 DIAGNOSIS — K219 Gastro-esophageal reflux disease without esophagitis: Secondary | ICD-10-CM

## 2021-07-18 DIAGNOSIS — R002 Palpitations: Secondary | ICD-10-CM

## 2021-07-18 DIAGNOSIS — N3001 Acute cystitis with hematuria: Secondary | ICD-10-CM | POA: Diagnosis not present

## 2021-07-18 LAB — URINALYSIS, ROUTINE W REFLEX MICROSCOPIC
Bilirubin, UA: NEGATIVE
Glucose, UA: NEGATIVE
Ketones, UA: NEGATIVE
Nitrite, UA: NEGATIVE
Protein,UA: NEGATIVE
Specific Gravity, UA: >1.03 — ABNORMAL HIGH (ref 1.005–1.030)
Urobilinogen, Ur: 0.2 mg/dL (ref 0.2–1.0)
pH, UA: 6 (ref 5.0–7.5)

## 2021-07-18 LAB — MICROSCOPIC EXAMINATION

## 2021-07-18 MED ORDER — NITROFURANTOIN MONOHYD MACRO 100 MG PO CAPS: 100.0000 mg | ORAL_CAPSULE | Freq: Two times a day (BID) | ORAL | 0 refills | Status: DC

## 2021-07-18 MED ORDER — OMEPRAZOLE 20 MG PO CPDR: 20.0000 mg | DELAYED_RELEASE_CAPSULE | Freq: Every day | ORAL | 1 refills | Status: DC

## 2021-07-18 NOTE — Telephone Encounter (Signed)
? ?  Chief Complaint: Palpitations last night. ?Symptoms: Lasted a few minutes. No chest pain ?Frequency: Last night ?Pertinent Negatives: Patient denies chest pain. Has uti symptoms as well. ?Disposition: '[]'$ ED /'[]'$ Urgent Care (no appt availability in office) / '[x]'$ Appointment(In office/virtual)/ '[]'$  [facility]/ '[]'$ Home Care/ '[]'$ Refused Recommended Disposition /'[]'$ [facility]/ '[]'$  Follow-up with PCP ?Additional Notes:   ?Reason for Disposition ? [1] Palpitations AND [2] no improvement after using CARE ADVICE ? ?Answer Assessment - Initial Assessment Questions ?1. DESCRIPTION: "Please describe your heart rate or heartbeat that you are having" (e.g., fast/slow, regular/irregular, skipped or extra beats, "palpitations") ?    Racing heart ?2. ONSET: "When did it start?" (Minutes, hours or days)  ?    Last night - lasted a few minutes ?3. DURATION: "How long does it last" (e.g., seconds, minutes, hours) ?    Minutes ?4. PATTERN "Does it come and go, or has it been constant since it started?"  "Does it get worse with exertion?"   "Are you feeling it now?" ?    Happened x 1 ?5. TAP: "Using your hand, can you tap out what you are feeling on a chair or table in front of you, so that I can hear?" (Note: not all patients can do this)   ?    No ?6. HEART RATE: "Can you tell me your heart rate?" "How many beats in 15 seconds?"  (Note: not all patients can do this)   ?    No ?7. RECURRENT SYMPTOM: "Have you ever had this before?" If Yes, ask: "When was the last time?" and "What happened that time?"  ?    Yes ?8. CAUSE: "What do you think is causing the palpitations?" ?    Unsure ?9. CARDIAC HISTORY: "Do you have any history of heart disease?" (e.g., heart attack, angina, bypass surgery, angioplasty, arrhythmia)  ?    Had an ablations 12 years ago ?10. OTHER SYMPTOMS: "Do you have any other symptoms?" (e.g., dizziness, chest pain, sweating, difficulty breathing) ?      Tired ?11. PREGNANCY: "Is there any chance  you are pregnant?" "When was your last menstrual period?" ?      no ? ?Protocols used: Heart Rate and Heartbeat Questions-A-AH ? ?

## 2021-07-18 NOTE — Progress Notes (Signed)
? ?BP (!) 170/102   Pulse 83   Temp 98.8 ?F (37.1 ?C) (Oral)   Wt 210 lb 3.2 oz (95.3 kg)   LMP  (LMP Unknown)   SpO2 97%   BMI 38.44 kg/m?   ? ?Subjective:  ? ? Patient ID: Holly Norman, female    DOB: [birth year], 51 y.o.   MRN: [id number] ? ?HPI: ?Holly Norman is a 51 y.o. female ? ?Chief Complaint  ?Patient presents with  ? Palpitations  ?  Onset last night , hx of cardiac ablation over 12 years ago. Lasted a few minutes per patients   ? Dysuria  ?  Onset 4 days ago.   ? Fatigue  ?   ?  ? ?PALPITATIONS ?Started last night.  She states she started feeling funny and her arm felt heavy.  Today she just feels exhausted.  History of cardiac ablation over 12 years ago. She is not sure what the rhythm was that she had it for.   ? ?URINARY SYMPTOMS ?Dysuria: yes ?Urinary frequency: yes ?Urgency: yes ?Small volume voids: yes ?Symptom severity: no ?Urinary incontinence: no ?Foul odor: yes ?Hematuria: yes ?Abdominal pain: yes- bladder spasms ?Back pain: no ?Suprapubic pain/pressure: no ?Flank pain: no ?Fever:  no ?Vomiting: no ?Relief with cranberry juice: no ?Relief with pyridium: no ?Status: worse ?Previous urinary tract infection: no ?Recurrent urinary tract infection: no ?Treatments attempted:  AZO   ? ? ?Patient is also having acid reflux with her trulicity.   ? ? ?Relevant past medical, surgical, family and social history reviewed and updated as indicated. Interim medical history since our last visit reviewed. ?Allergies and medications reviewed and updated. ? ?Review of Systems  ?Constitutional:  Positive for fatigue. Negative for fever.  ?Cardiovascular:  Positive for palpitations.  ?Gastrointestinal:  Negative for abdominal pain and vomiting.  ?Genitourinary:  Positive for dysuria, frequency, hematuria and urgency. Negative for decreased urine volume and flank pain.  ?Musculoskeletal:  Negative for back pain.  ?     Arm felt heavy  ? ?Per HPI unless specifically indicated above ? ?   ?Objective:  ?  ?BP  (!) 170/102   Pulse 83   Temp 98.8 ?F (37.1 ?C) (Oral)   Wt 210 lb 3.2 oz (95.3 kg)   LMP  (LMP Unknown)   SpO2 97%   BMI 38.44 kg/m?   ?Wt Readings from Last 3 Encounters:  ?07/18/21 210 lb 3.2 oz (95.3 kg)  ?04/02/21 220 lb 3.2 oz (99.9 kg)  ?01/29/21 214 lb (97.1 kg)  ?  ?Physical Exam ?Vitals and nursing note reviewed.  ?Constitutional:   ?   General: She is not in acute distress. ?   Appearance: Normal appearance. She is obese. She is not ill-appearing, toxic-appearing or diaphoretic.  ?HENT:  ?   Head: Normocephalic.  ?   Right Ear: External ear normal.  ?   Left Ear: External ear normal.  ?   Nose: Nose normal.  ?   Mouth/Throat:  ?   Mouth: Mucous membranes are moist.  ?   Pharynx: Oropharynx is clear.  ?Eyes:  ?   General:     ?   Right eye: No discharge.     ?   Left eye: No discharge.  ?   Extraocular Movements: Extraocular movements intact.  ?   Conjunctiva/sclera: Conjunctivae normal.  ?   Pupils: Pupils are equal, round, and reactive to light.  ?Cardiovascular:  ?   Rate and Rhythm: Normal rate and regular  rhythm.  ?   Heart sounds: No murmur heard. ?Pulmonary:  ?   Effort: Pulmonary effort is normal. No respiratory distress.  ?   Breath sounds: Normal breath sounds. No wheezing or rales.  ?Musculoskeletal:  ?   Cervical back: Normal range of motion and neck supple.  ?Skin: ?   General: Skin is warm and dry.  ?   Capillary Refill: Capillary refill takes less than 2 seconds.  ?Neurological:  ?   General: No focal deficit present.  ?   Mental Status: She is alert and oriented to person, place, and time. Mental status is at baseline.  ?Psychiatric:     ?   Mood and Affect: Mood normal.     ?   Behavior: Behavior normal.     ?   Thought Content: Thought content normal.     ?   Judgment: Judgment normal.  ? ? ?Results for orders placed or performed in visit on 07/18/21  ?Microscopic Examination  ? Urine  ?Result Value Ref Range  ? WBC, UA 6-10 (A) 0 - 5 /hpf  ? RBC 0-2 0 - 2 /hpf  ? Epithelial Cells  (non renal) 0-10 0 - 10 /hpf  ? Mucus, UA Present (A) Not Estab.  ? Bacteria, UA Few (A) None seen/Few  ?Urinalysis, Routine w reflex microscopic  ?Result Value Ref Range  ? Specific Gravity, UA >1.030 (H) 1.005 - 1.030  ? pH, UA 6.0 5.0 - 7.5  ? Color, UA Yellow Yellow  ? Appearance Ur Cloudy (A) Clear  ? Leukocytes,UA 1+ (A) Negative  ? Protein,UA Negative Negative/Trace  ? Glucose, UA Negative Negative  ? Ketones, UA Negative Negative  ? RBC, UA Trace (A) Negative  ? Bilirubin, UA Negative Negative  ? Urobilinogen, Ur 0.2 0.2 - 1.0 mg/dL  ? Nitrite, UA Negative Negative  ? Microscopic Examination See below:   ? ?   ?Assessment & Plan:  ? ?Problem List Items Addressed This Visit   ? ?  ? Digestive  ? GERD (gastroesophageal reflux disease)  ?  Due to taking Trulicity.  Will change from famotidine to Omeprazole daily.  Follow up in 1 week for reevaluation.   ? ?  ?  ? Relevant Medications  ? omeprazole (PRILOSEC) 20 MG capsule  ? ?Other Visit Diagnoses   ? ? Acute cystitis with hematuria    -  Primary  ? UA showed blood and Leuks. Will treat with Macrobid.  Will send for culture. Will make recommendations based on lab results.   ? Palpitations      ? EKG showed NSR in office today. Will refer to Cardiology to reestablish care. May need hoilter monitor. Symptoms could be related to UTI.  ? Relevant Orders  ? EKG 12-Lead (Completed)  ? Ambulatory referral to Cardiology  ? Dysuria      ? Relevant Orders  ? Urinalysis, Routine w reflex microscopic (Completed)  ? Urine culture positive      ? Relevant Orders  ? Urine Culture  ? ?  ?  ? ?Follow up plan: ?Return in about 1 week (around 07/25/2021) for Palpitation and UTI follow up. ? ? ? ? ? ?

## 2021-07-19 DIAGNOSIS — K219 Gastro-esophageal reflux disease without esophagitis: Secondary | ICD-10-CM | POA: Insufficient documentation

## 2021-07-19 NOTE — Progress Notes (Signed)
Results discussed with patient during visit.

## 2021-07-19 NOTE — Assessment & Plan Note (Signed)
Due to taking Trulicity.  Will change from famotidine to Omeprazole daily.  Follow up in 1 week for reevaluation.   ?

## 2021-07-22 LAB — URINE CULTURE

## 2021-07-22 NOTE — Progress Notes (Signed)
Hi Holly Norman.  You did have a UTI which I do think was causing your symptoms.  I will see you this week and we will see how you are feeling and likely recheck your urine.  Have a great day.

## 2021-07-25 ENCOUNTER — Ambulatory Visit (INDEPENDENT_AMBULATORY_CARE_PROVIDER_SITE_OTHER): Payer: 59 | Admitting: Nurse Practitioner

## 2021-07-25 ENCOUNTER — Encounter: Payer: Self-pay | Admitting: Nurse Practitioner

## 2021-07-25 VITALS — BP 171/101 | HR 78 | Temp 98.9°F | Wt 207.8 lb

## 2021-07-25 DIAGNOSIS — F419 Anxiety disorder, unspecified: Secondary | ICD-10-CM | POA: Diagnosis not present

## 2021-07-25 DIAGNOSIS — E782 Mixed hyperlipidemia: Secondary | ICD-10-CM

## 2021-07-25 DIAGNOSIS — I10 Essential (primary) hypertension: Secondary | ICD-10-CM | POA: Diagnosis not present

## 2021-07-25 DIAGNOSIS — E1122 Type 2 diabetes mellitus with diabetic chronic kidney disease: Secondary | ICD-10-CM

## 2021-07-25 DIAGNOSIS — R69 Illness, unspecified: Secondary | ICD-10-CM | POA: Diagnosis not present

## 2021-07-25 MED ORDER — LOSARTAN POTASSIUM 50 MG PO TABS: 50.0000 mg | ORAL_TABLET | Freq: Every day | ORAL | 0 refills | Status: DC

## 2021-07-25 MED ORDER — TRULICITY 0.75 MG/0.5ML ~~LOC~~ SOAJ: 0.7500 mg | SUBCUTANEOUS | 0 refills | Status: DC

## 2021-07-25 MED ORDER — LOSARTAN POTASSIUM 25 MG PO TABS: 25.0000 mg | ORAL_TABLET | Freq: Every day | ORAL | 1 refills | Status: DC

## 2021-07-25 MED ORDER — CITALOPRAM HYDROBROMIDE 10 MG PO TABS: 10.0000 mg | ORAL_TABLET | Freq: Every day | ORAL | 0 refills | Status: DC

## 2021-07-25 NOTE — Progress Notes (Signed)
BP (!) 171/101   Pulse 78   Temp 98.9 F (37.2 C) (Oral)   Wt 207 lb 12.8 oz (94.3 kg)   LMP  (LMP Unknown)   SpO2 97%   BMI 38.00 kg/m    Subjective:    Patient ID: Holly Norman, female    DOB: 10-08-1970, 51 y.o.   MRN: [id number]  HPI: Holly Norman is a 51 y.o. female  Chief Complaint  Patient presents with   Follow-up    Patient states she has not had anymore palpitations since last office visit and bladder spasms have resolved.  Patient would like to discuss her anxiety, she feels like it has gotten worse. She states she is always on edge. She mentioned HX of ADHD    HYPERTENSION Hypertension status: uncontrolled  Satisfied with current treatment? no Duration of hypertension: years BP monitoring frequency:  not checking BP range:  BP medication side effects:  no Medication compliance: excellent compliance Previous BP meds:losartan (cozaar) Aspirin: no Recurrent headaches: no Visual changes: no Palpitations: no Dyspnea: no Chest pain: no Lower extremity edema: no Dizzy/lightheaded: no  ANXIETY Patient states her anxiety has worsened over the last couple of years.  States her husband has noticed that her anxiety had worsened.  Patient states she can't relax and states she has a lot of nervous energy.     Relevant past medical, surgical, family and social history reviewed and updated as indicated. Interim medical history since our last visit reviewed. Allergies and medications reviewed and updated.  Review of Systems  Eyes:  Negative for visual disturbance.  Respiratory:  Negative for cough, chest tightness and shortness of breath.   Cardiovascular:  Negative for chest pain, palpitations and leg swelling.  Neurological:  Negative for dizziness and headaches.  Psychiatric/Behavioral:  The patient is nervous/anxious.    Per HPI unless specifically indicated above     Objective:    BP (!) 171/101   Pulse 78   Temp 98.9 F (37.2 C) (Oral)   Wt 207  lb 12.8 oz (94.3 kg)   LMP  (LMP Unknown)   SpO2 97%   BMI 38.00 kg/m   Wt Readings from Last 3 Encounters:  07/25/21 207 lb 12.8 oz (94.3 kg)  07/18/21 210 lb 3.2 oz (95.3 kg)  04/02/21 220 lb 3.2 oz (99.9 kg)    Physical Exam Vitals and nursing note reviewed.  Constitutional:      General: She is not in acute distress.    Appearance: Normal appearance. She is obese. She is not ill-appearing, toxic-appearing or diaphoretic.  HENT:     Head: Normocephalic.     Right Ear: External ear normal.     Left Ear: External ear normal.     Nose: Nose normal.     Mouth/Throat:     Mouth: Mucous membranes are moist.     Pharynx: Oropharynx is clear.  Eyes:     General:        Right eye: No discharge.        Left eye: No discharge.     Extraocular Movements: Extraocular movements intact.     Conjunctiva/sclera: Conjunctivae normal.     Pupils: Pupils are equal, round, and reactive to light.  Cardiovascular:     Rate and Rhythm: Normal rate and regular rhythm.     Heart sounds: No murmur heard. Pulmonary:     Effort: Pulmonary effort is normal. No respiratory distress.     Breath sounds: Normal breath sounds.  No wheezing or rales.  Musculoskeletal:     Cervical back: Normal range of motion and neck supple.  Skin:    General: Skin is warm and dry.     Capillary Refill: Capillary refill takes less than 2 seconds.  Neurological:     General: No focal deficit present.     Mental Status: She is alert and oriented to person, place, and time. Mental status is at baseline.  Psychiatric:        Mood and Affect: Mood normal.        Behavior: Behavior normal.        Thought Content: Thought content normal.        Judgment: Judgment normal.    Results for orders placed or performed in visit on 07/18/21  Urine Culture   Specimen: Urine   UR  Result Value Ref Range   Urine Culture, Routine Final report (A)    Organism ID, Bacteria Klebsiella pneumoniae (A)    Antimicrobial  Susceptibility Comment   Microscopic Examination   Urine  Result Value Ref Range   WBC, UA 6-10 (A) 0 - 5 /hpf   RBC 0-2 0 - 2 /hpf   Epithelial Cells (non renal) 0-10 0 - 10 /hpf   Mucus, UA Present (A) Not Estab.   Bacteria, UA Few (A) None seen/Few  Urinalysis, Routine w reflex microscopic  Result Value Ref Range   Specific Gravity, UA >1.030 (H) 1.005 - 1.030   pH, UA 6.0 5.0 - 7.5   Color, UA Yellow Yellow   Appearance Ur Cloudy (A) Clear   Leukocytes,UA 1+ (A) Negative   Protein,UA Negative Negative/Trace   Glucose, UA Negative Negative   Ketones, UA Negative Negative   RBC, UA Trace (A) Negative   Bilirubin, UA Negative Negative   Urobilinogen, Ur 0.2 0.2 - 1.0 mg/dL   Nitrite, UA Negative Negative   Microscopic Examination See below:       Assessment & Plan:   Problem List Items Addressed This Visit       Cardiovascular and Mediastinum   Hypertension - Primary    Chronic. Not well controlled.  Will increase Losartan from '25mg'$  daily to '50mg'$  daily.  Follow up in 2 weeks for reevaluation.  Call sooner if concerns arise.        Relevant Medications   losartan (COZAAR) 50 MG tablet     Other   Anxiety    Chronic. Has worsened recently.  Zoloft was no longer helping.  Will change to Celexa. Side effects and benefits of medication discussed with patient during visit.  Follow up in 2 weeks for reevaluation.  Call sooner if concerns arise.        Relevant Medications   citalopram (CELEXA) 10 MG tablet     Follow up plan: Return in about 2 weeks (around 08/08/2021) for HTN, HLD, DM2 FU.

## 2021-07-26 DIAGNOSIS — F419 Anxiety disorder, unspecified: Secondary | ICD-10-CM | POA: Insufficient documentation

## 2021-07-26 NOTE — Assessment & Plan Note (Signed)
Chronic. Has worsened recently.  Zoloft was no longer helping.  Will change to Celexa. Side effects and benefits of medication discussed with patient during visit.  Follow up in 2 weeks for reevaluation.  Call sooner if concerns arise.

## 2021-07-26 NOTE — Assessment & Plan Note (Addendum)
Chronic. Not well controlled.  Will increase Losartan from '25mg'$  daily to '50mg'$  daily.  Follow up in 2 weeks for reevaluation.  Call sooner if concerns arise.

## 2021-07-31 LAB — HM DIABETES EYE EXAM

## 2021-08-07 NOTE — Progress Notes (Deleted)
LMP  (LMP Unknown)    Subjective:    Patient ID: Holly Norman, female    DOB: 07/10/1970, 51 y.o.   MRN: [id number]  HPI: Holly Norman is a 51 y.o. female  No chief complaint on file.   HYPERTENSION / HYPERLIPIDEMIA Has gained some of the weight back that she previously lost.  Satisfied with current treatment? yes Duration of hypertension: years BP monitoring frequency: weekly BP range: 120/70 BP medication side effects: no Past BP meds: atenolol and losartan (cozaar) Duration of hyperlipidemia: years Cholesterol medication side effects: no Cholesterol supplements: fish oil Past cholesterol medications: atorvastain (lipitor) Medication compliance: excellent compliance Aspirin: no Recent stressors: no Recurrent headaches: no Visual changes: no Palpitations: no Dyspnea: no Chest pain: no Lower extremity edema: no Dizzy/lightheaded: no  DIABETES Patient would like to go back on Ozempic.  She has been out of insurance so had to stop the medication.  Has not been consistent with the metformin.  Hypoglycemic episodes:no Polydipsia/polyuria: no Visual disturbance: no Chest pain: no Paresthesias: no Glucose Monitoring: no  Accucheck frequency: Not Checking  Fasting glucose:  Post prandial:  Evening:  Before meals: Taking Insulin?: no  Long acting insulin:  Short acting insulin: Blood Pressure Monitoring: weekly Retinal Examination: Not up to Date Foot Exam: Not up to Date Diabetic Education: Not Completed Pneumovax: Not up to Date Influenza: Not up to Date Aspirin: no  DEPRESSION Patient states her mood is well controlled. She denies concerns regarding her mood at visit today.    Relevant past medical, surgical, family and social history reviewed and updated as indicated. Interim medical history since our last visit reviewed. Allergies and medications reviewed and updated.  Review of Systems  Eyes:  Negative for visual disturbance.  Respiratory:   Negative for cough, chest tightness and shortness of breath.   Cardiovascular:  Negative for chest pain, palpitations and leg swelling.  Endocrine: Negative for polydipsia and polyuria.  Neurological:  Negative for dizziness, numbness and headaches.  Psychiatric/Behavioral:  Negative for dysphoric mood. The patient is not nervous/anxious.    Per HPI unless specifically indicated above     Objective:    LMP  (LMP Unknown)   Wt Readings from Last 3 Encounters:  07/25/21 207 lb 12.8 oz (94.3 kg)  07/18/21 210 lb 3.2 oz (95.3 kg)  04/02/21 220 lb 3.2 oz (99.9 kg)    Physical Exam Vitals and nursing note reviewed.  Constitutional:      General: She is not in acute distress.    Appearance: Normal appearance. She is not ill-appearing, toxic-appearing or diaphoretic.  HENT:     Head: Normocephalic.     Right Ear: External ear normal.     Left Ear: External ear normal.     Nose: Nose normal.     Mouth/Throat:     Mouth: Mucous membranes are moist.     Pharynx: Oropharynx is clear.  Eyes:     General:        Right eye: No discharge.        Left eye: No discharge.     Extraocular Movements: Extraocular movements intact.     Conjunctiva/sclera: Conjunctivae normal.     Pupils: Pupils are equal, round, and reactive to light.  Cardiovascular:     Rate and Rhythm: Normal rate and regular rhythm.     Heart sounds: No murmur heard. Pulmonary:     Effort: Pulmonary effort is normal. No respiratory distress.     Breath sounds: Normal  breath sounds. No wheezing or rales.  Musculoskeletal:     Cervical back: Normal range of motion and neck supple.  Skin:    General: Skin is warm and dry.     Capillary Refill: Capillary refill takes less than 2 seconds.  Neurological:     General: No focal deficit present.     Mental Status: She is alert and oriented to person, place, and time. Mental status is at baseline.  Psychiatric:        Mood and Affect: Mood normal.        Behavior: Behavior  normal.        Thought Content: Thought content normal.        Judgment: Judgment normal.    Results for orders placed or performed in visit on 07/18/21  Urine Culture   Specimen: Urine   UR  Result Value Ref Range   Urine Culture, Routine Final report (A)    Organism ID, Bacteria Klebsiella pneumoniae (A)    Antimicrobial Susceptibility Comment   Microscopic Examination   Urine  Result Value Ref Range   WBC, UA 6-10 (A) 0 - 5 /hpf   RBC 0-2 0 - 2 /hpf   Epithelial Cells (non renal) 0-10 0 - 10 /hpf   Mucus, UA Present (A) Not Estab.   Bacteria, UA Few (A) None seen/Few  Urinalysis, Routine w reflex microscopic  Result Value Ref Range   Specific Gravity, UA >1.030 (H) 1.005 - 1.030   pH, UA 6.0 5.0 - 7.5   Color, UA Yellow Yellow   Appearance Ur Cloudy (A) Clear   Leukocytes,UA 1+ (A) Negative   Protein,UA Negative Negative/Trace   Glucose, UA Negative Negative   Ketones, UA Negative Negative   RBC, UA Trace (A) Negative   Bilirubin, UA Negative Negative   Urobilinogen, Ur 0.2 0.2 - 1.0 mg/dL   Nitrite, UA Negative Negative   Microscopic Examination See below:       Assessment & Plan:   Problem List Items Addressed This Visit      Cardiovascular and Mediastinum   Hypertension - Primary     Endocrine   Diabetes ([place])     Genitourinary   CKD (chronic kidney disease)     Other   Hyperlipidemia   Depression   Anxiety     Follow up plan: No follow-ups on file.

## 2021-08-08 ENCOUNTER — Ambulatory Visit: Payer: 59 | Admitting: Nurse Practitioner

## 2021-08-26 ENCOUNTER — Other Ambulatory Visit: Payer: Self-pay | Admitting: Nurse Practitioner

## 2021-08-26 ENCOUNTER — Encounter: Payer: Self-pay | Admitting: Nurse Practitioner

## 2021-08-27 ENCOUNTER — Ambulatory Visit: Payer: 59 | Admitting: Nurse Practitioner

## 2021-08-27 ENCOUNTER — Other Ambulatory Visit: Payer: Self-pay

## 2021-08-27 MED ORDER — LOSARTAN POTASSIUM 50 MG PO TABS: 50.0000 mg | ORAL_TABLET | Freq: Every day | ORAL | 0 refills | Status: DC

## 2021-08-27 MED ORDER — CITALOPRAM HYDROBROMIDE 10 MG PO TABS: 10.0000 mg | ORAL_TABLET | Freq: Every day | ORAL | 0 refills | Status: DC

## 2021-08-27 MED ORDER — TRULICITY 0.75 MG/0.5ML ~~LOC~~ SOAJ: 0.7500 mg | SUBCUTANEOUS | 0 refills | Status: DC

## 2021-08-27 NOTE — Telephone Encounter (Signed)
Mychart message received from the patient requesting refills on Losartan and Citalopram due to appointment being rescheduled from this week since the provider is out.

## 2021-09-24 NOTE — Progress Notes (Deleted)
LMP  (LMP Unknown)    Subjective:    Patient ID: Holly Norman, female    DOB: Apr 03, 1970, 51 y.o.   MRN: [id number]  HPI: Holly Norman is a 51 y.o. female  No chief complaint on file.  HYPERTENSION Hypertension status: uncontrolled  Satisfied with current treatment? no Duration of hypertension: years BP monitoring frequency:  not checking BP range:  BP medication side effects:  no Medication compliance: excellent compliance Previous BP meds:losartan (cozaar) Aspirin: no Recurrent headaches: no Visual changes: no Palpitations: no Dyspnea: no Chest pain: no Lower extremity edema: no Dizzy/lightheaded: no  ANXIETY Patient states her anxiety has worsened over the last couple of years.  States her husband has noticed that her anxiety had worsened.  Patient states she can't relax and states she has a lot of nervous energy.     Relevant past medical, surgical, family and social history reviewed and updated as indicated. Interim medical history since our last visit reviewed. Allergies and medications reviewed and updated.  Review of Systems  Eyes:  Negative for visual disturbance.  Respiratory:  Negative for cough, chest tightness and shortness of breath.   Cardiovascular:  Negative for chest pain, palpitations and leg swelling.  Neurological:  Negative for dizziness and headaches.  Psychiatric/Behavioral:  The patient is nervous/anxious.     Per HPI unless specifically indicated above     Objective:    LMP  (LMP Unknown)   Wt Readings from Last 3 Encounters:  07/25/21 207 lb 12.8 oz (94.3 kg)  07/18/21 210 lb 3.2 oz (95.3 kg)  04/02/21 220 lb 3.2 oz (99.9 kg)    Physical Exam Vitals and nursing note reviewed.  Constitutional:      General: She is not in acute distress.    Appearance: Normal appearance. She is obese. She is not ill-appearing, toxic-appearing or diaphoretic.  HENT:     Head: Normocephalic.     Right Ear: External ear normal.     Left Ear:  External ear normal.     Nose: Nose normal.     Mouth/Throat:     Mouth: Mucous membranes are moist.     Pharynx: Oropharynx is clear.  Eyes:     General:        Right eye: No discharge.        Left eye: No discharge.     Extraocular Movements: Extraocular movements intact.     Conjunctiva/sclera: Conjunctivae normal.     Pupils: Pupils are equal, round, and reactive to light.  Cardiovascular:     Rate and Rhythm: Normal rate and regular rhythm.     Heart sounds: No murmur heard. Pulmonary:     Effort: Pulmonary effort is normal. No respiratory distress.     Breath sounds: Normal breath sounds. No wheezing or rales.  Musculoskeletal:     Cervical back: Normal range of motion and neck supple.  Skin:    General: Skin is warm and dry.     Capillary Refill: Capillary refill takes less than 2 seconds.  Neurological:     General: No focal deficit present.     Mental Status: She is alert and oriented to person, place, and time. Mental status is at baseline.  Psychiatric:        Mood and Affect: Mood normal.        Behavior: Behavior normal.        Thought Content: Thought content normal.        Judgment: Judgment normal.  Results for orders placed or performed in visit on 07/18/21  Urine Culture   Specimen: Urine   UR  Result Value Ref Range   Urine Culture, Routine Final report (A)    Organism ID, Bacteria Klebsiella pneumoniae (A)    Antimicrobial Susceptibility Comment   Microscopic Examination   Urine  Result Value Ref Range   WBC, UA 6-10 (A) 0 - 5 /hpf   RBC, Urine 0-2 0 - 2 /hpf   Epithelial Cells (non renal) 0-10 0 - 10 /hpf   Mucus, UA Present (A) Not Estab.   Bacteria, UA Few (A) None seen/Few  Urinalysis, Routine w reflex microscopic  Result Value Ref Range   Specific Gravity, UA >1.030 (H) 1.005 - 1.030   pH, UA 6.0 5.0 - 7.5   Color, UA Yellow Yellow   Appearance Ur Cloudy (A) Clear   Leukocytes,UA 1+ (A) Negative   Protein,UA Negative Negative/Trace    Glucose, UA Negative Negative   Ketones, UA Negative Negative   RBC, UA Trace (A) Negative   Bilirubin, UA Negative Negative   Urobilinogen, Ur 0.2 0.2 - 1.0 mg/dL   Nitrite, UA Negative Negative   Microscopic Examination See below:       Assessment & Plan:   Problem List Items Addressed This Visit       Cardiovascular and Mediastinum   Hypertension - Primary     Endocrine   Diabetes ([place])     Genitourinary   CKD (chronic kidney disease)     Other   Hyperlipidemia   Depression   Anxiety     Follow up plan: No follow-ups on file.

## 2021-09-25 ENCOUNTER — Ambulatory Visit: Payer: Self-pay | Admitting: Nurse Practitioner

## 2021-09-25 DIAGNOSIS — I10 Essential (primary) hypertension: Secondary | ICD-10-CM

## 2021-09-25 DIAGNOSIS — F330 Major depressive disorder, recurrent, mild: Secondary | ICD-10-CM

## 2021-09-25 DIAGNOSIS — E1122 Type 2 diabetes mellitus with diabetic chronic kidney disease: Secondary | ICD-10-CM

## 2021-09-25 DIAGNOSIS — E782 Mixed hyperlipidemia: Secondary | ICD-10-CM

## 2021-09-25 DIAGNOSIS — N189 Chronic kidney disease, unspecified: Secondary | ICD-10-CM

## 2021-09-25 DIAGNOSIS — F419 Anxiety disorder, unspecified: Secondary | ICD-10-CM

## 2021-11-08 ENCOUNTER — Other Ambulatory Visit: Payer: Self-pay | Admitting: Nurse Practitioner

## 2021-11-12 NOTE — Telephone Encounter (Signed)
Requested medication (s) are due for refill today: yes  Requested medication (s) are on the active medication list: yes  Last refill:  08/27/21 #30 with 0 RF  Future visit scheduled: no, NO SHOW 09/25/21, seen 07/25/21  Notes to clinic:  This was a new med for pt to come back for assessment, no show for that, now does not have insurance, no appt scheduled, please assess.      Requested Prescriptions  Pending Prescriptions Disp Refills   citalopram (CELEXA) 10 MG tablet [Pharmacy Med Name: Citalopram Hydrobromide 10 MG Oral Tablet] 30 tablet 0    Sig: Take 1 tablet by mouth once daily     Psychiatry:  Antidepressants - SSRI Passed - 11/08/2021 10:29 AM      Passed - Completed PHQ-2 or PHQ-9 in the last 360 days      Passed - Valid encounter within last 6 months    Recent Outpatient Visits           3 months ago Primary hypertension   [facility] Jon Billings, NP   3 months ago Acute cystitis with hematuria   [facility] Jon Billings, NP   7 months ago Type 2 diabetes mellitus with chronic kidney disease, without long-term current use of insulin, unspecified CKD stage ([place])   [facility] Jon Billings, NP   1 year ago Type 2 diabetes mellitus with chronic kidney disease, without long-term current use of insulin, unspecified CKD stage ([place])   [facility] Jon Billings, NP   1 year ago Type 2 diabetes mellitus with chronic kidney disease, without long-term current use of insulin, unspecified CKD stage [facility])   [place] Myles Gip, DO

## 2021-11-14 ENCOUNTER — Encounter: Payer: Self-pay | Admitting: Nurse Practitioner

## 2021-11-14 NOTE — Telephone Encounter (Signed)
Pt scheduled  

## 2021-11-15 ENCOUNTER — Encounter: Payer: Self-pay | Admitting: Nurse Practitioner

## 2021-11-15 ENCOUNTER — Telehealth (INDEPENDENT_AMBULATORY_CARE_PROVIDER_SITE_OTHER): Payer: Self-pay | Admitting: Nurse Practitioner

## 2021-11-15 DIAGNOSIS — E1122 Type 2 diabetes mellitus with diabetic chronic kidney disease: Secondary | ICD-10-CM

## 2021-11-15 DIAGNOSIS — J0110 Acute frontal sinusitis, unspecified: Secondary | ICD-10-CM

## 2021-11-15 DIAGNOSIS — E782 Mixed hyperlipidemia: Secondary | ICD-10-CM

## 2021-11-15 MED ORDER — ATORVASTATIN CALCIUM 80 MG PO TABS
80.0000 mg | ORAL_TABLET | Freq: Every day | ORAL | 0 refills | Status: DC
Start: 2021-11-15 — End: 2022-02-07

## 2021-11-15 MED ORDER — OMEPRAZOLE 20 MG PO CPDR: 20.0000 mg | DELAYED_RELEASE_CAPSULE | Freq: Every day | ORAL | 0 refills | Status: DC

## 2021-11-15 MED ORDER — AMOXICILLIN 500 MG PO CAPS: 500.0000 mg | ORAL_CAPSULE | Freq: Two times a day (BID) | ORAL | 0 refills | Status: AC

## 2021-11-15 MED ORDER — CITALOPRAM HYDROBROMIDE 10 MG PO TABS: 10.0000 mg | ORAL_TABLET | Freq: Every day | ORAL | 0 refills | Status: DC

## 2021-11-15 MED ORDER — METFORMIN HCL 1000 MG PO TABS: 1000.0000 mg | ORAL_TABLET | Freq: Two times a day (BID) | ORAL | 0 refills | Status: DC

## 2021-11-15 MED ORDER — METHYLPREDNISOLONE 4 MG PO TBPK: ORAL_TABLET | ORAL | 0 refills | Status: DC

## 2021-11-15 MED ORDER — LOSARTAN POTASSIUM 50 MG PO TABS: 50.0000 mg | ORAL_TABLET | Freq: Every day | ORAL | 0 refills | Status: DC

## 2021-11-15 MED ORDER — EZETIMIBE 10 MG PO TABS: 10.0000 mg | ORAL_TABLET | Freq: Every day | ORAL | 0 refills | Status: DC

## 2021-11-15 MED ORDER — ATENOLOL 50 MG PO TABS: 50.0000 mg | ORAL_TABLET | Freq: Every day | ORAL | 0 refills | Status: DC

## 2021-11-15 MED ORDER — FENOFIBRATE 145 MG PO TABS: 145.0000 mg | ORAL_TABLET | Freq: Every day | ORAL | 0 refills | Status: DC

## 2021-11-15 MED ORDER — GLIPIZIDE 5 MG PO TABS: 5.0000 mg | ORAL_TABLET | Freq: Two times a day (BID) | ORAL | 0 refills | Status: DC

## 2021-11-15 MED ORDER — HYDROCOD POLI-CHLORPHE POLI ER 10-8 MG/5ML PO SUER: 5.0000 mL | Freq: Every evening | ORAL | 0 refills | Status: DC | PRN

## 2021-11-15 NOTE — Progress Notes (Signed)
LMP  (LMP Unknown)    Subjective:    Patient ID: Holly Norman, female    DOB: 28-Jul-1970, 51 y.o.   MRN: [id number]  HPI: [name] is a 51 y.o. female  Chief Complaint  Patient presents with   Sinus Problem    Sinus pressure, dry cough ongoing since Sunday. Reports a lot of congestion in chest. OTC mucinex with no relief.    UPPER RESPIRATORY TRACT INFECTION Worst symptom: Symptoms started on Saturday Fever: no Cough: yes Shortness of breath: no Wheezing: yes Chest pain: no Chest tightness: yes Chest congestion: no Nasal congestion: yes Runny nose: no Post nasal drip: no Sneezing: no Sore throat: no Swollen glands: no Sinus pressure: yes Headache: no Face pain: no Toothache: no Ear pain: no bilateral Ear pressure: no bilateral Eyes red/itching:no Eye drainage/crusting: no  Vomiting: no Rash: no Fatigue: yes Sick contacts: no Strep contacts: no  Context: stable Recurrent sinusitis: no Relief with OTC cold/cough medications: no  Treatments attempted: mucinex   Relevant past medical, surgical, family and social history reviewed and updated as indicated. Interim medical history since our last visit reviewed. Allergies and medications reviewed and updated.  Review of Systems  Constitutional:  Negative for fatigue and fever.  HENT:  Positive for congestion, sinus pressure and sinus pain. Negative for dental problem, ear pain, postnasal drip, rhinorrhea, sneezing and sore throat.   Respiratory:  Positive for cough and wheezing. Negative for shortness of breath.   Cardiovascular:  Negative for chest pain.  Gastrointestinal:  Negative for vomiting.  Skin:  Negative for rash.  Neurological:  Negative for headaches.    Per HPI unless specifically indicated above     Objective:    LMP  (LMP Unknown)   Wt Readings from Last 3 Encounters:  07/25/21 207 lb 12.8 oz (94.3 kg)  07/18/21 210 lb 3.2 oz (95.3 kg)  04/02/21 220 lb 3.2 oz (99.9 kg)     Physical Exam Vitals and nursing note reviewed.  HENT:     Head: Normocephalic.     Right Ear: Hearing normal.     Left Ear: Hearing normal.     Nose: Nose normal.  Eyes:     Pupils: Pupils are equal, round, and reactive to light.  Pulmonary:     Effort: Pulmonary effort is normal. No respiratory distress.  Neurological:     Mental Status: She is alert.  Psychiatric:        Mood and Affect: Mood normal.        Behavior: Behavior normal.        Thought Content: Thought content normal.        Judgment: Judgment normal.     Results for orders placed or performed in visit on 11/06/21  HM DIABETES EYE EXAM  Result Value Ref Range   HM Diabetic Eye Exam No Retinopathy No Retinopathy      Assessment & Plan:   Problem List Items Addressed This Visit   None    Follow up plan: No follow-ups on file.  This visit was completed via MyChart due to the restrictions of the COVID-19 pandemic. All issues as above were discussed and addressed. Physical exam was done as above through visual confirmation on MyChart. If it was felt that the patient should be evaluated in the office, they were directed there. The patient verbally consented to this visit. Location of the patient: Home Location of the provider: Office Those involved with this call:  Provider: Jon Billings,  NP CMA:  Valinda Hoar, CMA Front Desk/Registration: Lynnell Catalan This encounter was conducted via video.  I spent 20 dedicated to the care of this patient on the date of this encounter to include previsit review of symptoms, plan of care and follow up, face to face time with the patient, and post visit ordering of testing.

## 2022-01-08 ENCOUNTER — Encounter: Payer: Self-pay | Admitting: Nurse Practitioner

## 2022-01-08 DIAGNOSIS — E1122 Type 2 diabetes mellitus with diabetic chronic kidney disease: Secondary | ICD-10-CM

## 2022-01-08 DIAGNOSIS — E782 Mixed hyperlipidemia: Secondary | ICD-10-CM

## 2022-02-07 MED ORDER — GLIPIZIDE 5 MG PO TABS: 5.0000 mg | ORAL_TABLET | Freq: Two times a day (BID) | ORAL | 0 refills | Status: DC

## 2022-02-07 MED ORDER — FENOFIBRATE 145 MG PO TABS: 145.0000 mg | ORAL_TABLET | Freq: Every day | ORAL | 0 refills | Status: DC

## 2022-02-07 MED ORDER — METFORMIN HCL 1000 MG PO TABS: 1000.0000 mg | ORAL_TABLET | Freq: Two times a day (BID) | ORAL | 0 refills | Status: DC

## 2022-02-07 MED ORDER — ATENOLOL 50 MG PO TABS: 50.0000 mg | ORAL_TABLET | Freq: Every day | ORAL | 0 refills | Status: DC

## 2022-02-07 MED ORDER — LOSARTAN POTASSIUM 50 MG PO TABS
50.0000 mg | ORAL_TABLET | Freq: Every day | ORAL | 0 refills | Status: DC
Start: 2022-02-07 — End: 2022-05-21

## 2022-02-07 MED ORDER — EZETIMIBE 10 MG PO TABS: 10.0000 mg | ORAL_TABLET | Freq: Every day | ORAL | 0 refills | Status: DC

## 2022-02-07 MED ORDER — ATORVASTATIN CALCIUM 80 MG PO TABS: 80.0000 mg | ORAL_TABLET | Freq: Every day | ORAL | 0 refills | Status: DC

## 2022-02-07 MED ORDER — CITALOPRAM HYDROBROMIDE 10 MG PO TABS: 10.0000 mg | ORAL_TABLET | Freq: Every day | ORAL | 0 refills | Status: DC

## 2022-02-07 MED ORDER — OMEPRAZOLE 20 MG PO CPDR: 20.0000 mg | DELAYED_RELEASE_CAPSULE | Freq: Every day | ORAL | 0 refills | Status: DC

## 2022-02-07 MED ORDER — TRULICITY 0.75 MG/0.5ML ~~LOC~~ SOAJ: 0.7500 mg | SUBCUTANEOUS | 0 refills | Status: DC

## 2022-02-10 ENCOUNTER — Telehealth: Payer: 59 | Admitting: Physician Assistant

## 2022-02-10 ENCOUNTER — Encounter: Payer: Self-pay | Admitting: Physician Assistant

## 2022-02-10 ENCOUNTER — Ambulatory Visit: Payer: Self-pay

## 2022-02-10 DIAGNOSIS — R6889 Other general symptoms and signs: Secondary | ICD-10-CM

## 2022-02-10 MED ORDER — BENZONATATE 100 MG PO CAPS: 100.0000 mg | ORAL_CAPSULE | Freq: Three times a day (TID) | ORAL | 0 refills | Status: DC | PRN

## 2022-02-10 MED ORDER — ALBUTEROL SULFATE HFA 108 (90 BASE) MCG/ACT IN AERS: 2.0000 | INHALATION_SPRAY | Freq: Four times a day (QID) | RESPIRATORY_TRACT | 0 refills | Status: AC | PRN

## 2022-02-10 NOTE — Progress Notes (Signed)
Virtual Visit Consent   Holly Norman, you are scheduled for a virtual visit with a [facility] provider today. Just as with appointments in the office, your consent must be obtained to participate. Your consent will be active for this visit and any virtual visit you may have with one of our providers in the next 365 days. If you have a MyChart account, a copy of this consent can be sent to you electronically.  As this is a virtual visit, video technology does not allow for your provider to perform a traditional examination. This may limit your provider's ability to fully assess your condition. If your provider identifies any concerns that need to be evaluated in person or the need to arrange testing (such as labs, EKG, etc.), we will make arrangements to do so. Although advances in technology are sophisticated, we cannot ensure that it will always work on either your end or our end. If the connection with a video visit is poor, the visit may have to be switched to a telephone visit. With either a video or telephone visit, we are not always able to ensure that we have a secure connection.  By engaging in this virtual visit, you consent to the provision of healthcare and authorize for your insurance to be billed (if applicable) for the services provided during this visit. Depending on your insurance coverage, you may receive a charge related to this service.  I need to obtain your verbal consent now. Are you willing to proceed with your visit today? Holly Norman has provided verbal consent on 02/10/2022 for a virtual visit (video or telephone). Leeanne Rio, [place]  Date: 02/10/2022 8:58 AM  Virtual Visit via Video Note   I, Leeanne Rio, connected with  Holly Norman  ([id number], Jul 06, 1970) on 02/10/22 at  9:00 AM EST by a video-enabled telemedicine application and verified that I am speaking with the correct person using two identifiers.  Location: Patient: Virtual Visit Location  Patient: Home Provider: Virtual Visit Location Provider: Home Office   I discussed the limitations of evaluation and management by telemedicine and the availability of in person appointments. The patient expressed understanding and agreed to proceed.    History of Present Illness: Holly Norman is a 51 y.o. who identifies as a female who was assigned female at birth, and is being seen today for URI symptoms starting last Thursday (4.5 days ago).  Endorses Thursday afternoon, starting to feel bad with abrupt onset of cough, nasal congestion, fatigue and bodyaches.  Fever followed shortly with Tmax of 102.9 this past Saturday.  Has continued to have a low-grade fever since then averaging 100-101.  Notes some chest wall soreness from persistent coughing.  Is affecting sleep.  Some occasional wheezing but denies overt shortness of breath.  Denies sinus pain or tooth pain.  Has COVID tested and is negative.  Husband with similar symptoms now, also COVID-negative.  Has been using DayQuil/NyQuil, Mucinex and Tylenol over-the-counter.  No other known sick contacts   HPI: HPI  Problems:  Patient Active Problem List   Diagnosis Date Noted   Anxiety 07/26/2021   GERD (gastroesophageal reflux disease) 07/19/2021   Hypertension    Rash 04/03/2020   Varicose veins 10/24/2014   Hyperlipidemia 10/24/2014   Hypertensive CKD (chronic kidney disease) 10/24/2014   Diabetes ([place]) 10/24/2014   Depression 10/24/2014   CKD (chronic kidney disease) 10/24/2014   Restless legs 10/24/2014    Allergies:  Allergies  Allergen Reactions  Ivp Dye [Iodinated Contrast Media] Shortness Of Breath   Tape Rash   Medications:  Current Outpatient Medications:    albuterol (VENTOLIN HFA) 108 (90 Base) MCG/ACT inhaler, Inhale 2 puffs into the lungs every 6 (six) hours as needed for wheezing or shortness of breath., Disp: 8 g, Rfl: 0   benzonatate (TESSALON) 100 MG capsule, Take 1 capsule (100 mg total) by mouth 3 (three)  times daily as needed for cough., Disp: 30 capsule, Rfl: 0   atenolol (TENORMIN) 50 MG tablet, Take 1 tablet (50 mg total) by mouth daily., Disp: 60 tablet, Rfl: 0   atorvastatin (LIPITOR) 80 MG tablet, Take 1 tablet (80 mg total) by mouth daily., Disp: 60 tablet, Rfl: 0   chlorpheniramine-HYDROcodone (TUSSIONEX) 10-8 MG/5ML, Take 5 mLs by mouth at bedtime as needed for cough., Disp: 115 mL, Rfl: 0   citalopram (CELEXA) 10 MG tablet, Take 1 tablet (10 mg total) by mouth daily., Disp: 60 tablet, Rfl: 0   Dulaglutide (TRULICITY) 2.95 AO/1.3YQ SOPN, Inject 0.75 mg into the skin once a week., Disp: 3 mL, Rfl: 0   ezetimibe (ZETIA) 10 MG tablet, Take 1 tablet (10 mg total) by mouth daily., Disp: 60 tablet, Rfl: 0   fenofibrate (TRICOR) 145 MG tablet, Take 1 tablet (145 mg total) by mouth daily., Disp: 60 tablet, Rfl: 0   glipiZIDE (GLUCOTROL) 5 MG tablet, Take 1 tablet (5 mg total) by mouth 2 (two) times daily before a meal., Disp: 60 tablet, Rfl: 0   losartan (COZAAR) 50 MG tablet, Take 1 tablet (50 mg total) by mouth daily., Disp: 60 tablet, Rfl: 0   meclizine (ANTIVERT) 25 MG tablet, Take 1 tablet (25 mg total) by mouth 3 (three) times daily as needed for dizziness., Disp: 30 tablet, Rfl: 1   metFORMIN (GLUCOPHAGE) 1000 MG tablet, Take 1 tablet (1,000 mg total) by mouth 2 (two) times daily with a meal., Disp: 120 tablet, Rfl: 0   Omega-3 Fatty Acids (FISH OIL) 1000 MG CAPS, Take 1,000 mg by mouth daily., Disp: , Rfl:    omeprazole (PRILOSEC) 20 MG capsule, Take 1 capsule (20 mg total) by mouth daily., Disp: 60 capsule, Rfl: 0  Observations/Objective: Patient is well-developed, well-nourished in no acute distress.  Resting comfortably at home.  Head is normocephalic, atraumatic.  No labored breathing. Speech is clear and coherent with logical content.  Patient is alert and oriented at baseline.   Assessment and Plan: 1. Flu-like symptoms - benzonatate (TESSALON) 100 MG capsule; Take 1 capsule  (100 mg total) by mouth 3 (three) times daily as needed for cough.  Dispense: 30 capsule; Refill: 0 - albuterol (VENTOLIN HFA) 108 (90 Base) MCG/ACT inhaler; Inhale 2 puffs into the lungs every 6 (six) hours as needed for wheezing or shortness of breath.  Dispense: 8 g; Refill: 0  Classic symptoms with abrupt onset.  COVID-negative.  Unfortunately she is outside the window for an antiviral.  Do not suspect a secondary bacterial infection at this time.  Supportive measures, vitamin recommendations and OTC medications reviewed.  Tessalon and albuterol per orders.  Sent patient a message through [facility] that we will allow her to apply directly to me if symptoms or not turning the corner over the next 48 to 72 hours or if anything worsens despite treatment.  In person precautions discussed.  Follow Up Instructions: I discussed the assessment and treatment plan with the patient. The patient was provided an opportunity to ask questions and all were answered. The patient agreed  with the plan and demonstrated an understanding of the instructions.  A copy of instructions were sent to the patient via MyChart unless otherwise noted below.   The patient was advised to call back or seek an in-person evaluation if the symptoms worsen or if the condition fails to improve as anticipated.  Time:  I spent 10 minutes with the patient via telehealth technology discussing the above problems/concerns.    Leeanne Rio, PA-C

## 2022-02-10 NOTE — Telephone Encounter (Signed)
  Chief Complaint: Cough, BA, Wheezing, sore chest muscles Symptoms: above Frequency: Thursday Pertinent Negatives: Patient denies SOB Disposition: '[]'$ ED /'[]'$ Urgent Care (no appt availability in office) / '[]'$ Appointment(In office/virtual)/ '[x]'$  [facility]/ '[]'$ Home Care/ '[]'$ Refused Recommended Disposition /'[]'$ [facility]/ '[]'$  Follow-up with PCP Additional Notes: Pt started feeling a bit off on Thursday. Over the weekend pt had fever of 102. Pt has a dry cough that is nearly constant. Pt states her chest muscles are sore from cough.

## 2022-02-10 NOTE — Patient Instructions (Signed)
Holly Norman, thank you for joining Leeanne Rio, PA-C for today's virtual visit.  While this provider is not your primary care provider (PCP), if your PCP is located in our provider database this encounter information will be shared with them immediately following your visit.   [name] account gives you access to today's visit and all your visits, tests, and labs performed at [name] " click here if you don't have a [name] account or go to [email or web address]  Consent: (Patient) Holly Norman provided verbal consent for this virtual visit at the beginning of the encounter.  Current Medications:  Current Outpatient Medications:    atenolol (TENORMIN) 50 MG tablet, Take 1 tablet (50 mg total) by mouth daily., Disp: 60 tablet, Rfl: 0   atorvastatin (LIPITOR) 80 MG tablet, Take 1 tablet (80 mg total) by mouth daily., Disp: 60 tablet, Rfl: 0   chlorpheniramine-HYDROcodone (TUSSIONEX) 10-8 MG/5ML, Take 5 mLs by mouth at bedtime as needed for cough., Disp: 115 mL, Rfl: 0   citalopram (CELEXA) 10 MG tablet, Take 1 tablet (10 mg total) by mouth daily., Disp: 60 tablet, Rfl: 0   Dulaglutide (TRULICITY) 8.76 OT/1.5BW SOPN, Inject 0.75 mg into the skin once a week., Disp: 3 mL, Rfl: 0   ezetimibe (ZETIA) 10 MG tablet, Take 1 tablet (10 mg total) by mouth daily., Disp: 60 tablet, Rfl: 0   fenofibrate (TRICOR) 145 MG tablet, Take 1 tablet (145 mg total) by mouth daily., Disp: 60 tablet, Rfl: 0   glipiZIDE (GLUCOTROL) 5 MG tablet, Take 1 tablet (5 mg total) by mouth 2 (two) times daily before a meal., Disp: 60 tablet, Rfl: 0   losartan (COZAAR) 50 MG tablet, Take 1 tablet (50 mg total) by mouth daily., Disp: 60 tablet, Rfl: 0   meclizine (ANTIVERT) 25 MG tablet, Take 1 tablet (25 mg total) by mouth 3 (three) times daily as needed for dizziness., Disp: 30 tablet, Rfl: 1   metFORMIN (GLUCOPHAGE) 1000 MG tablet, Take 1 tablet (1,000 mg total) by mouth 2  (two) times daily with a meal., Disp: 120 tablet, Rfl: 0   Omega-3 Fatty Acids (FISH OIL) 1000 MG CAPS, Take 1,000 mg by mouth daily., Disp: , Rfl:    omeprazole (PRILOSEC) 20 MG capsule, Take 1 capsule (20 mg total) by mouth daily., Disp: 60 capsule, Rfl: 0   Medications ordered in this encounter:  No orders of the defined types were placed in this encounter.    *If you need refills on other medications prior to your next appointment, please contact your pharmacy*  Follow-Up: Call back or seek an in-person evaluation if the symptoms worsen or if the condition fails to improve as anticipated.  [name] [phone]  Other Instructions I am glad your COVID test were negative. Please continue to hydrate and rest. Okay to use Tylenol if needed for headache, body aches and residual fever. If you have a humidifier, running in the bedroom at night. Start OTC vitamin C 1000 mg daily and vitamin D3 1000 units daily. Okay to continue your DayQuil/NyQuil. Start the [place] and use albuterol as directed. If symptoms or not turning the corner over the next 48 hours, or if you note any new or worsening symptoms, please message me or your primary care provider ASAP.  Do not delay care.   If you have been instructed to have an in-person evaluation today at a local Urgent Care facility, please use the link below.  It will take you to a list of all of our available [name], including address, phone number and hours of operation. Please do not delay care.  [name]  If you or a family member do not have a primary care provider, use the link below to schedule a visit and establish care. When you choose a [facility] primary care physician or advanced practice provider, you gain a long-term partner in health. Find a Primary Care Provider  Learn more about [facility] in-office and virtual care options: [facility]

## 2022-02-10 NOTE — Telephone Encounter (Signed)
Reason for Disposition  [1] MILD difficulty breathing (e.g., minimal/no SOB at rest, SOB with walking, pulse <100) AND [2] still present when not coughing  Fever present > 3 days (72 hours)  Answer Assessment - Initial Assessment Questions See other protocol  Answer Assessment - Initial Assessment Questions 1. ONSET: "When did the cough begin?"      Thursday 2. SEVERITY: "How bad is the cough today?"     Moderate 3. SPUTUM: "Describe the color of your sputum" (none, dry cough; clear, white, yellow, green)     no 4. HEMOPTYSIS: "Are you coughing up any blood?" If so ask: "How much?" (flecks, streaks, tablespoons, etc.)      5. DIFFICULTY BREATHING: "Are you having difficulty breathing?" If Yes, ask: "How bad is it?" (e.g., mild, moderate, severe)    - MILD: No SOB at rest, mild SOB with walking, speaks normally in sentences, can lie down, no retractions, pulse < 100.    - MODERATE: SOB at rest, SOB with minimal exertion and prefers to sit, cannot lie down flat, speaks in phrases, mild retractions, audible wheezing, pulse 100-120.    - SEVERE: Very SOB at rest, speaks in single words, struggling to breathe, sitting hunched forward, retractions, pulse > 120      Mild - moderate 6. FEVER: "Do you have a fever?" If Yes, ask: "What is your temperature, how was it measured, and when did it start?"     100.3 7. CARDIAC HISTORY: "Do you have any history of heart disease?" (e.g., heart attack, congestive heart failure)      Yes 8. LUNG HISTORY: "Do you have any history of lung disease?"  (e.g., pulmonary embolus, asthma, emphysema)     no 9. PE RISK FACTORS: "Do you have a history of blood clots?" (or: recent major surgery, recent prolonged travel, bedridden)     no 10. OTHER SYMPTOMS: "Do you have any other symptoms?" (e.g., runny nose, wheezing, chest pain)       Chest, wheezing 11. PREGNANCY: "Is there any chance you are pregnant?" "When was your last menstrual period?"        12. TRAVEL:  "Have you traveled out of the country in the last month?" (e.g., travel history, exposures)  Protocols used: Fever-A-AH, Cough - Acute Non-Productive-A-AH

## 2022-02-12 ENCOUNTER — Ambulatory Visit
Admission: RE | Admit: 2022-02-12 | Discharge: 2022-02-12 | Disposition: A | Discharge status: Home / Self Care | Payer: Self-pay | Attending: Nurse Practitioner | Admitting: Nurse Practitioner

## 2022-02-12 ENCOUNTER — Ambulatory Visit
Admission: RE | Admit: 2022-02-12 | Discharge: 2022-02-12 | Disposition: A | Discharge status: Home / Self Care | Payer: Self-pay | Source: Ambulatory Visit | Attending: Nurse Practitioner | Admitting: Nurse Practitioner

## 2022-02-12 ENCOUNTER — Ambulatory Visit (INDEPENDENT_AMBULATORY_CARE_PROVIDER_SITE_OTHER): Payer: Self-pay | Admitting: Nurse Practitioner

## 2022-02-12 ENCOUNTER — Ambulatory Visit: Payer: Self-pay | Admitting: *Deleted

## 2022-02-12 ENCOUNTER — Encounter: Payer: Self-pay | Admitting: Nurse Practitioner

## 2022-02-12 VITALS — BP 152/98 | HR 112 | Temp 99.1°F | Ht 62.05 in | Wt 195.6 lb

## 2022-02-12 DIAGNOSIS — R051 Acute cough: Secondary | ICD-10-CM | POA: Insufficient documentation

## 2022-02-12 DIAGNOSIS — E1169 Type 2 diabetes mellitus with other specified complication: Secondary | ICD-10-CM | POA: Insufficient documentation

## 2022-02-12 DIAGNOSIS — E669 Obesity, unspecified: Secondary | ICD-10-CM | POA: Insufficient documentation

## 2022-02-12 MED ORDER — AMOXICILLIN-POT CLAVULANATE 875-125 MG PO TABS: 1.0000 | ORAL_TABLET | Freq: Two times a day (BID) | ORAL | 0 refills | Status: AC

## 2022-02-12 MED ORDER — IPRATROPIUM-ALBUTEROL 0.5-2.5 (3) MG/3ML IN SOLN: 3.0000 mL | Freq: Once | RESPIRATORY_TRACT | Status: DC

## 2022-02-12 MED ORDER — GUAIFENESIN-CODEINE 100-10 MG/5ML PO SOLN: 5.0000 mL | Freq: Two times a day (BID) | ORAL | 0 refills | Status: DC | PRN

## 2022-02-12 MED ORDER — PREDNISONE 20 MG PO TABS: 40.0000 mg | ORAL_TABLET | Freq: Every day | ORAL | 0 refills | Status: DC

## 2022-02-12 MED ORDER — ALBUTEROL SULFATE (2.5 MG/3ML) 0.083% IN NEBU
2.5000 mg | INHALATION_SOLUTION | Freq: Once | RESPIRATORY_TRACT | Status: AC
  Administered 2022-02-12: 2.5 mg via RESPIRATORY_TRACT

## 2022-02-12 NOTE — Patient Instructions (Signed)
IMAGING LOCATION: [address], [place], [place] [address] [phone]   Cough, Adult A cough helps to clear your throat and lungs. A cough may be a sign of an illness or another medical condition. An acute cough may only last 2-3 weeks, while a chronic cough may last 8 or more weeks. Many things can cause a cough. They include: Germs (viruses or bacteria) that attack the airway. Breathing in things that bother (irritate) your lungs. Allergies. Asthma. Mucus that runs down the back of your throat (postnasal drip). Smoking. Acid backing up from the stomach into the tube that moves food from the mouth to the stomach (gastroesophageal reflux). Some medicines. Lung problems. Other medical conditions, such as heart failure or a blood clot in the lung (pulmonary embolism). Follow these instructions at home: Medicines Take over-the-counter and prescription medicines only as told by your doctor. Talk with your doctor before you take medicines that stop a cough (cough suppressants). Lifestyle  Do not smoke, and try not to be around smoke. Do not use any products that contain nicotine or tobacco, such as cigarettes, e-cigarettes, and chewing tobacco. If you need help quitting, ask your doctor. Drink enough fluid to keep your pee (urine) pale yellow. Avoid caffeine. Do not drink alcohol if your doctor tells you not to drink. General instructions  Watch for any changes in your cough. Tell your doctor about them. Always cover your mouth when you cough. Stay away from things that make you cough, such as perfume, candles, campfire smoke, or cleaning products. If the air is dry, use a cool mist vaporizer or humidifier in your home. If your cough is worse at night, try using extra pillows to raise your head up higher while you sleep. Rest as needed. Keep all follow-up visits as told by your doctor. This is important. Contact a doctor if: You have new symptoms. You cough up  pus. Your cough does not get better after 2-3 weeks, or your cough gets worse. Cough medicine does not help your cough and you are not sleeping well. You have pain that gets worse or pain that is not helped with medicine. You have a fever. You are losing weight and you do not know why. You have night sweats. Get help right away if: You cough up blood. You have trouble breathing. Your heartbeat is very fast. These symptoms may be an emergency. Do not wait to see if the symptoms will go away. Get medical help right away. Call your local emergency services (911 in the [place] Do not drive yourself to the hospital. Summary A cough helps to clear your throat and lungs. Many things can cause a cough. Take over-the-counter and prescription medicines only as told by your doctor. Always cover your mouth when you cough. Contact a doctor if you have new symptoms or you have a cough that does not get better or gets worse. This information is not intended to replace advice given to you by your health care provider. Make sure you discuss any questions you have with your health care provider. Document Revised: 04/15/2019 Document Reviewed: 03/15/2018 Elsevier Patient Education  [employer].

## 2022-02-12 NOTE — Telephone Encounter (Signed)
Reason for Disposition  [1] Continuous (nonstop) coughing interferes with work or school AND [2] no improvement using cough treatment per Care Advice  Answer Assessment - Initial Assessment Questions 1. ONSET: "When did the cough begin?"      Started last Thursday- Friday cough started 2. SEVERITY: "How bad is the cough today?"      Cough is not improving 3. SPUTUM: "Describe the color of your sputum" (none, dry cough; clear, white, yellow, green)     Not getting anything up 4. HEMOPTYSIS: "Are you coughing up any blood?" If so ask: "How much?" (flecks, streaks, tablespoons, etc.)     na 5. DIFFICULTY BREATHING: "Are you having difficulty breathing?" If Yes, ask: "How bad is it?" (e.g., mild, moderate, severe)    - MILD: No SOB at rest, mild SOB with walking, speaks normally in sentences, can lie down, no retractions, pulse < 100.    - MODERATE: SOB at rest, SOB with minimal exertion and prefers to sit, cannot lie down flat, speaks in phrases, mild retractions, audible wheezing, pulse 100-120.    - SEVERE: Very SOB at rest, speaks in single words, struggling to breathe, sitting hunched forward, retractions, pulse > 120      Wheezing- inhaler helps a little 6. FEVER: "Do you have a fever?" If Yes, ask: "What is your temperature, how was it measured, and when did it start?"     3am- 102.6, tylenol- 100 7. CARDIAC HISTORY: "Do you have any history of heart disease?" (e.g., heart attack, congestive heart failure)      Ablation hx 8. LUNG HISTORY: "Do you have any history of lung disease?"  (e.g., pulmonary embolus, asthma, emphysema)     no 9. PE RISK FACTORS: "Do you have a history of blood clots?" (or: recent major surgery, recent prolonged travel, bedridden)     no 10. OTHER SYMPTOMS: "Do you have any other symptoms?" (e.g., runny nose, wheezing, chest pain)       Wheezing, tightness and soreness from cough  Protocols used: Cough - Acute Productive-A-AH

## 2022-02-12 NOTE — Assessment & Plan Note (Signed)
Acute for one week with no improvement, possibly starting with flu.  Higher risk with diabetes underlying.  Will obtain chest imaging to further assess.  Start Augmentin BID for 7 days (add on Zpack if PNA present) and send in cough syrup.  No Prednisone due to last A1c 9.1%.  Recommend: - Increased rest - Increasing Fluids - Acetaminophen as needed for fever/pain.  - Salt water gargling, chloraseptic spray and throat lozenges - OTC Coricidin or Diabetic Tussin - Mucinex.  - Humidifying the air.  Return if ongoing or worsening, if PNA present will have return in one week.

## 2022-02-12 NOTE — Progress Notes (Signed)
Acute Office Visit  Subjective:     Patient ID: Holly Norman, female    DOB: August 22, 1970, 51 y.o.   MRN: [id number]  Chief Complaint  Patient presents with   Cough    Fever, Started last Thursday. Seen PA virtually on Monday. Was prescribed cough perles that did not help. Talked to the PA again last night and told her to come in for the possibility of pneumonia  Patient also states that she is having chills and body aches. Has been taking Tylenol and ibuprofen . Last dose was at 11 am    Started last Thursday, 02/06/22.  Seen by virtual urgent care on 02/10/22.  Was thought that her husband and her had flu, reports she was told she was too far past treatment period and not given antiviral. Was given Tessalon.  Felt a little bit better yesterday, but today feels worse again -- fever this morning and deep cough.  Has started sneezing with very productive sneezing.  Cough This is a new problem. The current episode started 1 to 4 weeks ago. The problem has been unchanged. The problem occurs every few minutes. The cough is Non-productive. Associated symptoms include chills, ear congestion, a fever, headaches, myalgias, nasal congestion, postnasal drip, rhinorrhea, shortness of breath (with coughing) and wheezing. Pertinent negatives include no chest pain, ear pain, heartburn, hemoptysis, rash, sore throat, sweats or weight loss. Nothing aggravates the symptoms. She has tried OTC cough suppressant, rest and prescription cough suppressant (Albuterol inhaler) for the symptoms. The treatment provided no relief. There is no history of asthma or pneumonia.   Patient is in today for cough.  Review of Systems  Constitutional:  Positive for chills, diaphoresis, fever and malaise/fatigue. Negative for weight loss.  HENT:  Positive for congestion, postnasal drip and rhinorrhea. Negative for ear pain, sinus pain and sore throat.   Respiratory:  Positive for cough, shortness of breath (with coughing) and  wheezing. Negative for hemoptysis.   Cardiovascular:  Negative for chest pain, palpitations, orthopnea and PND.  Gastrointestinal:  Negative for heartburn.  Musculoskeletal:  Positive for myalgias.  Skin:  Negative for rash.  Neurological:  Positive for headaches.  Psychiatric/Behavioral: Negative.        Objective:    BP (!) 152/98   Pulse (!) 112   Temp 99.1 F (37.3 C) (Oral)   Ht 5' 2.05" (1.576 m)   Wt 195 lb 9.6 oz (88.7 kg)   LMP  (LMP Unknown)   SpO2 96%   BMI 35.72 kg/m  BP Readings from Last 3 Encounters:  02/12/22 (!) 152/98  07/25/21 (!) 171/101  07/18/21 (!) 170/102   Wt Readings from Last 3 Encounters:  02/12/22 195 lb 9.6 oz (88.7 kg)  07/25/21 207 lb 12.8 oz (94.3 kg)  07/18/21 210 lb 3.2 oz (95.3 kg)   Physical Exam Vitals and nursing note reviewed.  Constitutional:      General: She is awake. She is not in acute distress.    Appearance: She is well-developed and well-groomed. She is obese. She is not ill-appearing or toxic-appearing.  HENT:     Head: Normocephalic.     Right Ear: Hearing, ear canal and external ear normal. A middle ear effusion is present. There is no impacted cerumen. Tympanic membrane is not injected or perforated.     Left Ear: Hearing, ear canal and external ear normal. A middle ear effusion is present. There is no impacted cerumen. Tympanic membrane is not injected or perforated.  Nose: Rhinorrhea present. Rhinorrhea is clear.     Right Sinus: No maxillary sinus tenderness or frontal sinus tenderness.     Left Sinus: No maxillary sinus tenderness or frontal sinus tenderness.     Mouth/Throat:     Mouth: Mucous membranes are moist.     Pharynx: Posterior oropharyngeal erythema (mild cobblestone pattern) present. No pharyngeal swelling or oropharyngeal exudate.  Eyes:     General: Lids are normal.        Right eye: No discharge.        Left eye: No discharge.     Conjunctiva/sclera: Conjunctivae normal.     Pupils: Pupils are  equal, round, and reactive to light.  Neck:     Thyroid: No thyromegaly.     Vascular: No carotid bruit.  Cardiovascular:     Rate and Rhythm: Normal rate and regular rhythm.     Heart sounds: Normal heart sounds. No murmur heard.    No gallop.  Pulmonary:     Effort: Pulmonary effort is normal. No accessory muscle usage or respiratory distress.     Breath sounds: Wheezing present. No rhonchi.     Comments: Occasional expiratory wheezes noted throughout. Abdominal:     General: Bowel sounds are normal.     Palpations: Abdomen is soft. There is no hepatomegaly or splenomegaly.  Musculoskeletal:     Cervical back: Normal range of motion and neck supple.     Right lower leg: No edema.     Left lower leg: No edema.  Lymphadenopathy:     Head:     Right side of head: No submental, submandibular, tonsillar, preauricular or posterior auricular adenopathy.     Left side of head: No submental, submandibular, tonsillar, preauricular or posterior auricular adenopathy.     Cervical: No cervical adenopathy.  Skin:    General: Skin is warm and dry.  Neurological:     Mental Status: She is alert and oriented to person, place, and time.  Psychiatric:        Attention and Perception: Attention normal.        Mood and Affect: Mood normal.        Speech: Speech normal.        Behavior: Behavior normal. Behavior is cooperative.        Thought Content: Thought content normal.    No results found for any visits on 02/12/22.      Assessment & Plan:   Problem List Items Addressed This Visit       Other   Acute cough - Primary    Acute for one week with no improvement, possibly starting with flu.  Higher risk with diabetes underlying.  Will obtain chest imaging to further assess.  Start Augmentin BID for 7 days (add on Zpack if PNA present) and send in cough syrup.  No Prednisone due to last A1c 9.1%.  Recommend: - Increased rest - Increasing Fluids - Acetaminophen as needed for fever/pain.   - Salt water gargling, chloraseptic spray and throat lozenges - OTC Coricidin or Diabetic Tussin - Mucinex.  - Humidifying the air.  Return if ongoing or worsening, if PNA present will have return in one week.      Relevant Medications   ipratropium-albuterol (DUONEB) 0.5-2.5 (3) MG/3ML nebulizer solution 3 mL   Other Relevant Orders   DG Chest 2 View    Meds ordered this encounter  Medications   amoxicillin-clavulanate (AUGMENTIN) 875-125 MG tablet    Sig: Take 1 tablet  by mouth 2 (two) times daily for 7 days.    Dispense:  14 tablet    Refill:  0   DISCONTD: predniSONE (DELTASONE) 20 MG tablet    Sig: Take 2 tablets (40 mg total) by mouth daily with breakfast for 5 days.    Dispense:  10 tablet    Refill:  0   ipratropium-albuterol (DUONEB) 0.5-2.5 (3) MG/3ML nebulizer solution 3 mL   guaiFENesin-codeine 100-10 MG/5ML syrup    Sig: Take 5 mLs by mouth 2 (two) times daily as needed for cough.    Dispense:  118 mL    Refill:  0    No follow-ups on file.  Venita Lick, NP

## 2022-02-12 NOTE — Addendum Note (Signed)
Addended by: Irena Reichmann on: 02/12/2022 02:03 PM   Modules accepted: Orders

## 2022-02-14 ENCOUNTER — Encounter: Payer: Self-pay | Admitting: Nurse Practitioner

## 2022-02-14 IMAGING — MG DIGITAL DIAGNOSTIC BILAT W/ TOMO W/ CAD
8 series · 8 of 24 positions shown · non-contrast
Comparison: Previous exam(s).

CLINICAL DATA: Follow-up probably benign mass in the 6:30 o'clock
position of the right breast.

EXAM:
DIGITAL DIAGNOSTIC BILATERAL MAMMOGRAM WITH CAD AND TOMO
ULTRASOUND RIGHT BREAST

[R CC synth-2D]
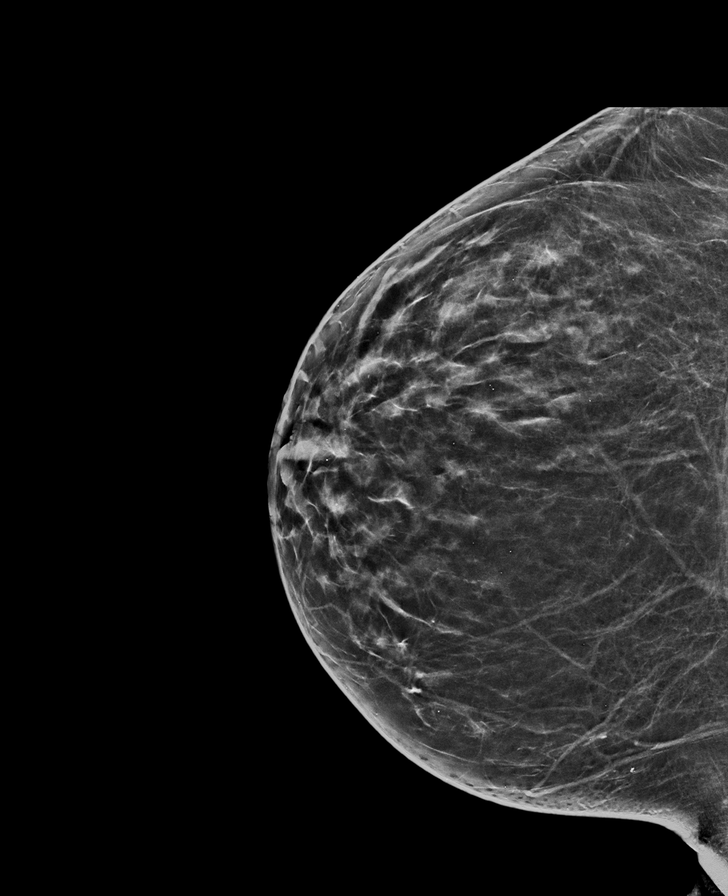

[R MLO synth-2D]
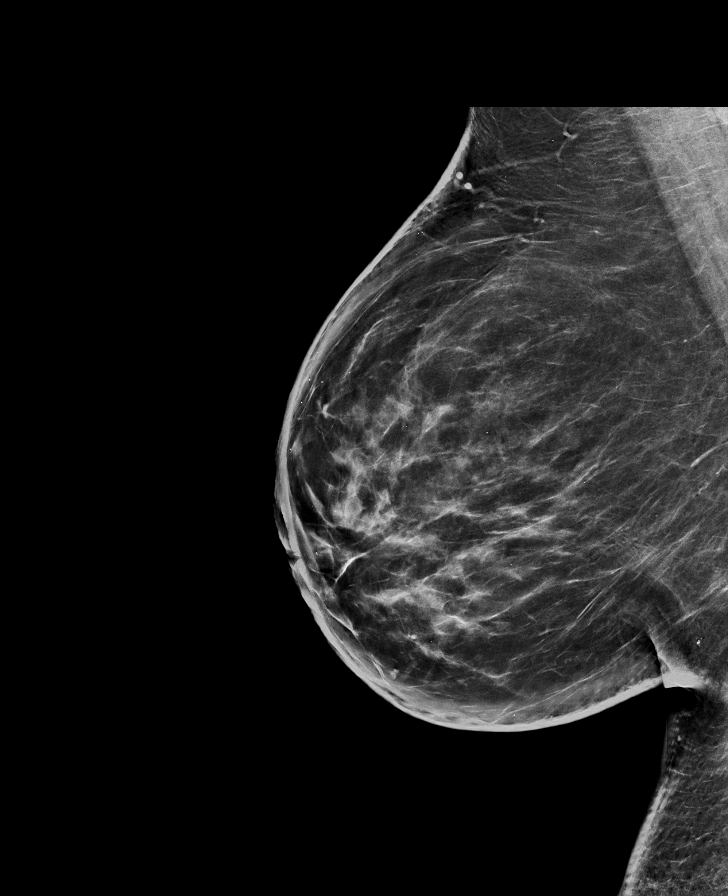

[L CC synth-2D]
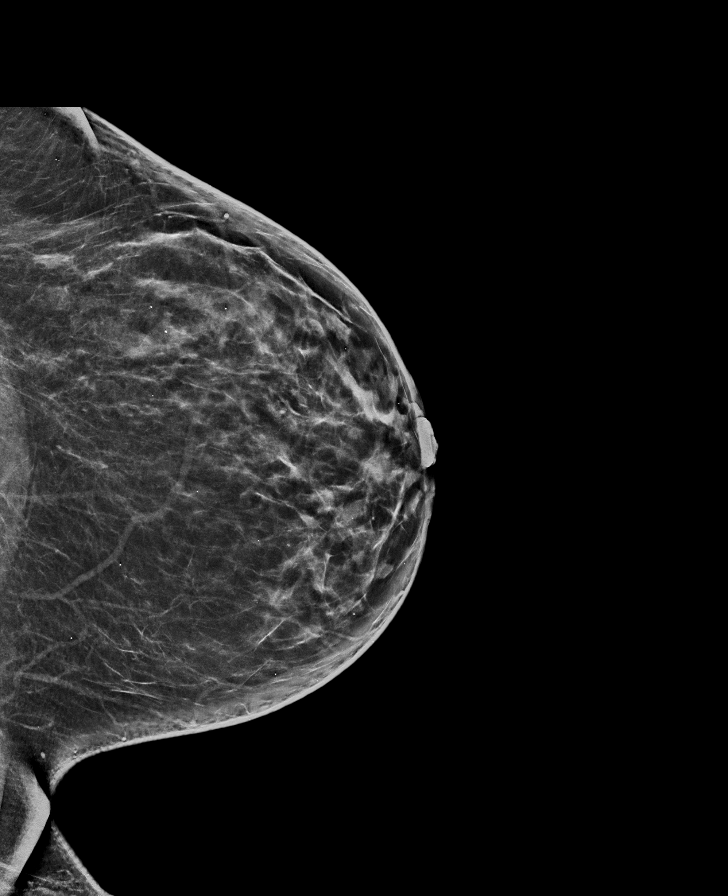

[L MLO synth-2D]
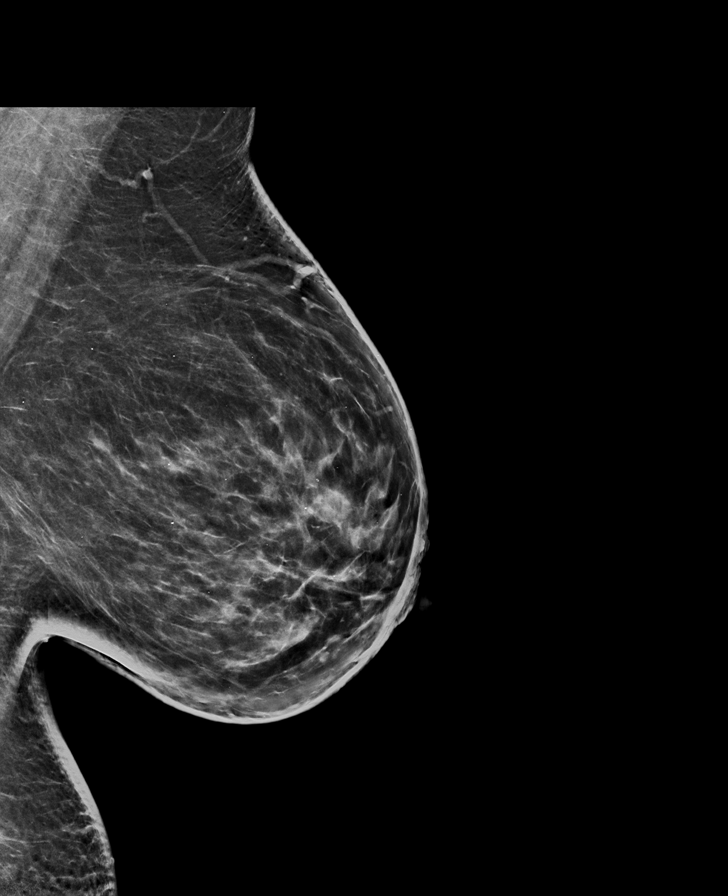

[L CC tomo · tomo slice 35/69.0]
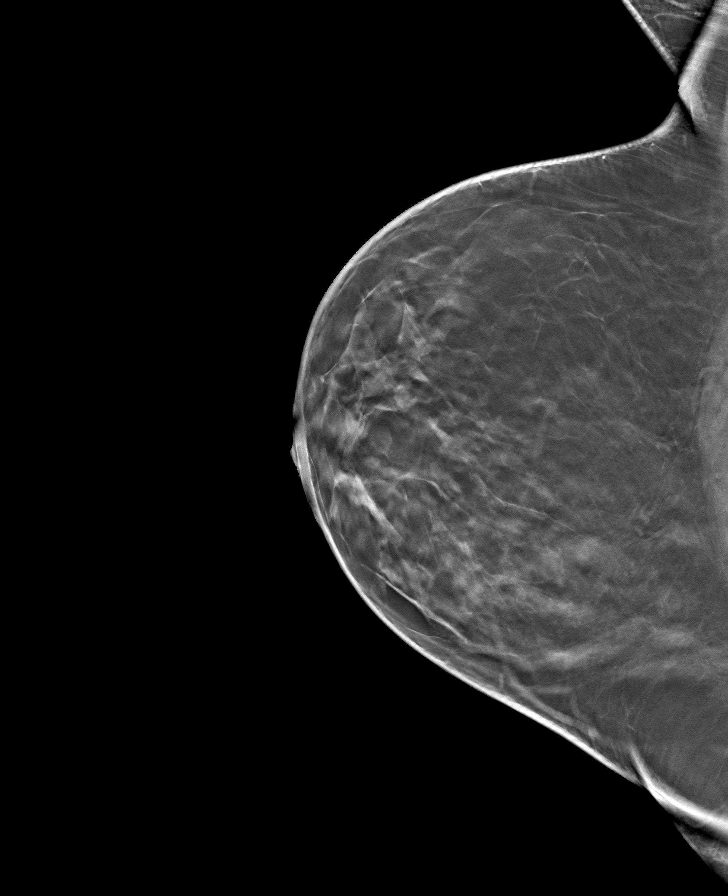

[L MLO tomo · tomo slice 42/83.0]
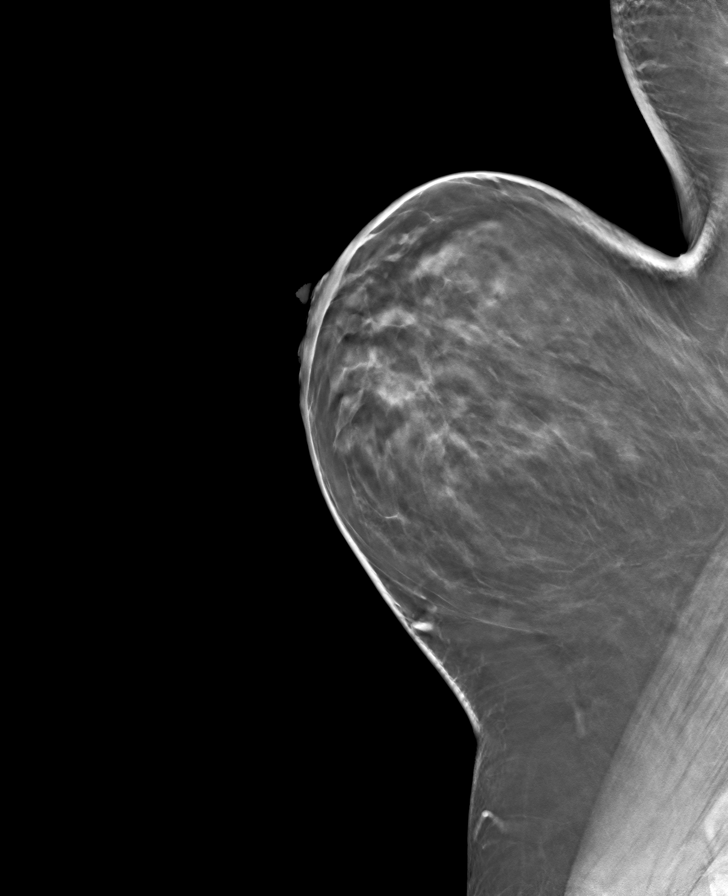

[R CC tomo · tomo slice 35/69.0]
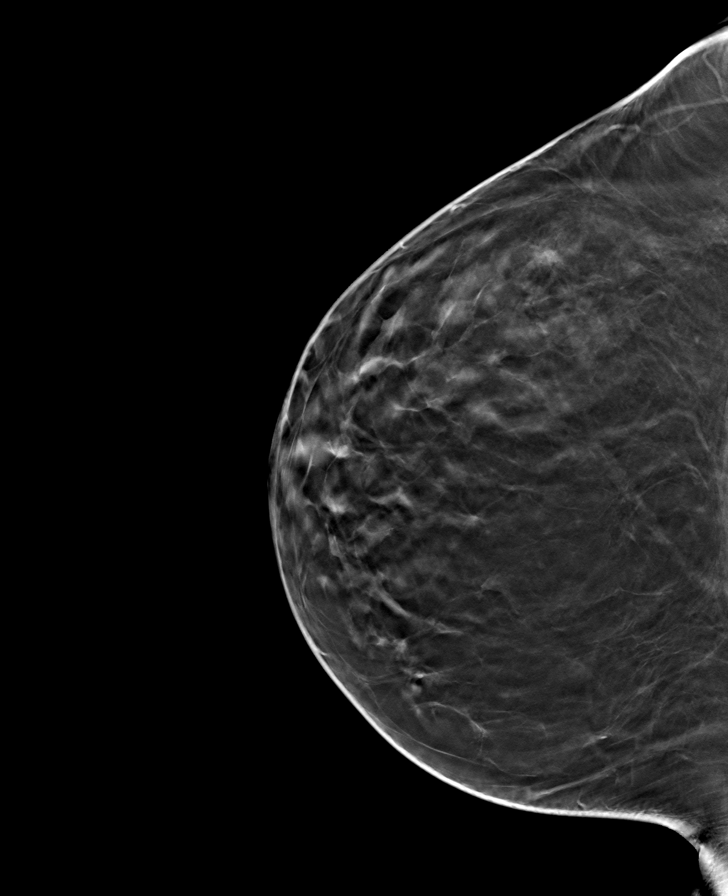

[R MLO tomo · tomo slice 43/85.0]
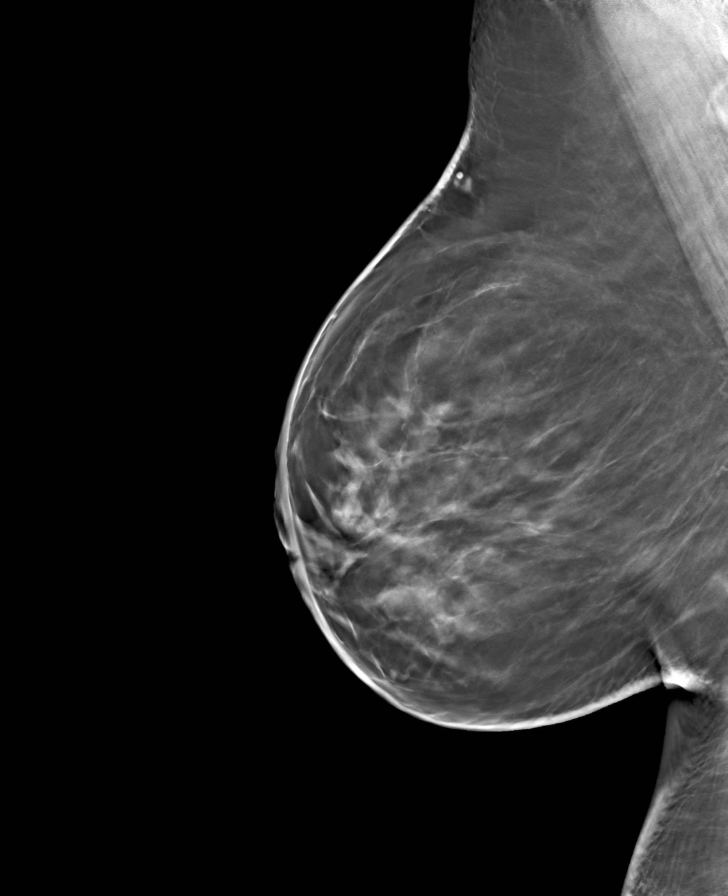

[8 of 24 positions shown; findings below may reference images not displayed]

ACR Breast Density Category c: The breast tissue is heterogeneously
dense, which may obscure small masses.
FINDINGS: A small, oval, circumscribed mass in the inferior right breast has
not changed significantly since 04/22/2018. No interval findings
suspicious for malignancy in either breast.

Mammographic images were processed with CAD.

Targeted ultrasound is performed, showing a 7 x 6 x 4 mm oval,
horizontally oriented, circumscribed, hypoechoic mass in the 6:30
o'clock position of the right breast, 2 cm from the nipple. This
measured 7 x 6 x 4 mm on 10/25/2018 and 7 x 7 x 4 mm on 04/22/2018.
IMPRESSION: 1. Stable right breast probable fibroadenoma.
2. No mammographic evidence of malignancy elsewhere in either
breast.

RECOMMENDATION:
Bilateral diagnostic mammogram and right breast ultrasound in 1 year
to complete 2 years of follow-up of the probable fibroadenoma in the
right breast.

I have discussed the findings and recommendations with the patient.
If applicable, a reminder letter will be sent to the patient
regarding the next appointment.

BI-RADS CATEGORY  3: Probably benign.

## 2022-02-14 NOTE — Progress Notes (Signed)
Contacted via [place] afternoon Holly Norman, your chest imaging shows no pneumonia!!  Great news!! Continue current treatment plan and let me know if you need anything:)

## 2022-04-05 ENCOUNTER — Emergency Department: Payer: 59

## 2022-04-05 ENCOUNTER — Other Ambulatory Visit: Payer: Self-pay

## 2022-04-05 ENCOUNTER — Emergency Department
Admission: EM | Admit: 2022-04-05 | Discharge: 2022-04-05 | Disposition: A | Discharge status: Home / Self Care | Payer: 59 | Attending: Emergency Medicine | Admitting: Emergency Medicine

## 2022-04-05 DIAGNOSIS — E119 Type 2 diabetes mellitus without complications: Secondary | ICD-10-CM | POA: Diagnosis not present

## 2022-04-05 DIAGNOSIS — I129 Hypertensive chronic kidney disease with stage 1 through stage 4 chronic kidney disease, or unspecified chronic kidney disease: Secondary | ICD-10-CM | POA: Insufficient documentation

## 2022-04-05 DIAGNOSIS — T23072A Burn of unspecified degree of left wrist, initial encounter: Secondary | ICD-10-CM | POA: Diagnosis not present

## 2022-04-05 DIAGNOSIS — T2003XA Burn of unspecified degree of chin, initial encounter: Secondary | ICD-10-CM | POA: Diagnosis not present

## 2022-04-05 DIAGNOSIS — Z7984 Long term (current) use of oral hypoglycemic drugs: Secondary | ICD-10-CM | POA: Diagnosis not present

## 2022-04-05 DIAGNOSIS — Z79899 Other long term (current) drug therapy: Secondary | ICD-10-CM | POA: Diagnosis not present

## 2022-04-05 DIAGNOSIS — X19XXXA Contact with other heat and hot substances, initial encounter: Secondary | ICD-10-CM | POA: Insufficient documentation

## 2022-04-05 DIAGNOSIS — S8012XA Contusion of left lower leg, initial encounter: Secondary | ICD-10-CM | POA: Insufficient documentation

## 2022-04-05 DIAGNOSIS — S8992XA Unspecified injury of left lower leg, initial encounter: Secondary | ICD-10-CM

## 2022-04-05 DIAGNOSIS — Y92410 Unspecified street and highway as the place of occurrence of the external cause: Secondary | ICD-10-CM | POA: Diagnosis not present

## 2022-04-05 DIAGNOSIS — T23071A Burn of unspecified degree of right wrist, initial encounter: Secondary | ICD-10-CM | POA: Insufficient documentation

## 2022-04-05 DIAGNOSIS — S3991XA Unspecified injury of abdomen, initial encounter: Secondary | ICD-10-CM | POA: Diagnosis not present

## 2022-04-05 DIAGNOSIS — S301XXA Contusion of abdominal wall, initial encounter: Secondary | ICD-10-CM | POA: Insufficient documentation

## 2022-04-05 DIAGNOSIS — S8011XA Contusion of right lower leg, initial encounter: Secondary | ICD-10-CM | POA: Insufficient documentation

## 2022-04-05 DIAGNOSIS — N189 Chronic kidney disease, unspecified: Secondary | ICD-10-CM | POA: Insufficient documentation

## 2022-04-05 DIAGNOSIS — S8991XA Unspecified injury of right lower leg, initial encounter: Secondary | ICD-10-CM

## 2022-04-05 DIAGNOSIS — W2210XA Striking against or struck by unspecified automobile airbag, initial encounter: Secondary | ICD-10-CM

## 2022-04-05 MED ORDER — ONDANSETRON HCL 4 MG/2ML IJ SOLN
4.0000 mg | Freq: Once | INTRAMUSCULAR | Status: AC
  Administered 2022-04-05: 4 mg via INTRAVENOUS
  Filled 2022-04-05: qty 2

## 2022-04-05 MED ORDER — OXYCODONE-ACETAMINOPHEN 5-325 MG PO TABS: 1.0000 | ORAL_TABLET | Freq: Four times a day (QID) | ORAL | 0 refills | Status: DC | PRN

## 2022-04-05 MED ORDER — ONDANSETRON 4 MG PO TBDP: 4.0000 mg | ORAL_TABLET | Freq: Three times a day (TID) | ORAL | 0 refills | Status: AC | PRN

## 2022-04-05 MED ORDER — MORPHINE SULFATE (PF) 4 MG/ML IV SOLN
4.0000 mg | Freq: Once | INTRAVENOUS | Status: AC
  Administered 2022-04-05: 4 mg via INTRAVENOUS
  Filled 2022-04-05: qty 1

## 2022-04-05 MED ORDER — OXYCODONE-ACETAMINOPHEN 5-325 MG PO TABS: 1.0000 | ORAL_TABLET | Freq: Once | ORAL | Status: DC
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
## 2022-04-05 NOTE — ED Notes (Signed)
Discharge instructions explained to patient and family at this time. Patient and family state they understand and agree.   

## 2022-04-05 NOTE — ED Notes (Signed)
Pt off to CT and Xray.

## 2022-04-12 ENCOUNTER — Emergency Department
Admission: EM | Admit: 2022-04-12 | Discharge: 2022-04-12 | Disposition: A | Discharge status: Home / Self Care | Payer: 59 | Attending: Emergency Medicine | Admitting: Emergency Medicine

## 2022-04-12 ENCOUNTER — Emergency Department: Payer: 59

## 2022-04-12 ENCOUNTER — Other Ambulatory Visit: Payer: Self-pay

## 2022-04-12 ENCOUNTER — Encounter: Payer: Self-pay | Admitting: Emergency Medicine

## 2022-04-12 DIAGNOSIS — S8011XA Contusion of right lower leg, initial encounter: Secondary | ICD-10-CM | POA: Insufficient documentation

## 2022-04-12 DIAGNOSIS — Y92410 Unspecified street and highway as the place of occurrence of the external cause: Secondary | ICD-10-CM | POA: Insufficient documentation

## 2022-04-12 DIAGNOSIS — S8992XA Unspecified injury of left lower leg, initial encounter: Secondary | ICD-10-CM | POA: Diagnosis not present

## 2022-04-12 DIAGNOSIS — S8012XA Contusion of left lower leg, initial encounter: Secondary | ICD-10-CM | POA: Diagnosis not present
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
## 2022-04-24 ENCOUNTER — Inpatient Hospital Stay: Payer: Self-pay | Admitting: Nurse Practitioner

## 2022-04-24 ENCOUNTER — Ambulatory Visit: Payer: 59 | Admitting: Nurse Practitioner

## 2022-04-24 ENCOUNTER — Encounter: Payer: Self-pay | Admitting: Nurse Practitioner

## 2022-04-24 VITALS — BP 138/82 | HR 75 | Temp 97.8°F | Ht 62.05 in | Wt 204.3 lb

## 2022-04-24 DIAGNOSIS — T148XXA Other injury of unspecified body region, initial encounter: Secondary | ICD-10-CM | POA: Diagnosis not present

## 2022-04-24 MED ORDER — DOXYCYCLINE HYCLATE 100 MG PO TABS: 100.0000 mg | ORAL_TABLET | Freq: Two times a day (BID) | ORAL | 0 refills | Status: AC
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
## 2022-05-18 NOTE — Patient Instructions (Incomplete)
Please call to schedule your mammogram and/or bone density: [facility]  Address: [address], [place], [place] [address] Phone: [phone]  [facility] [place] [address] [place],  [place]  [address] Phone: [phone]    Diabetes Mellitus Basics  Diabetes mellitus, or diabetes, is a long-term (chronic) disease. It occurs when the body does not properly use sugar (glucose) that is released from food after you eat. Diabetes mellitus may be caused by one or both of these problems: Your pancreas does not make enough of a hormone called insulin. Your body does not react in a normal way to the insulin that it makes. Insulin lets glucose enter cells in your body. This gives you energy. If you have diabetes, glucose cannot get into cells. This causes high blood glucose (hyperglycemia). How to treat and manage diabetes You may need to take insulin or other diabetes medicines daily to keep your glucose in balance. If you are prescribed insulin, you will learn how to give yourself insulin by injection. You may need to adjust the amount of insulin you take based on the foods that you eat. You will need to check your blood glucose levels using a glucose monitor as told by your health care provider. The readings can help determine if you have low or high blood glucose. Generally, you should have these blood glucose levels: Before meals (preprandial): 80-130 mg/dL (4.4-7.2 mmol/L). After meals (postprandial): below 180 mg/dL (10 mmol/L). Hemoglobin A1c (HbA1c) level: less than 7%. Your health care provider will set treatment goals for you. Keep all follow-up visits. This is important. Follow these instructions at home: Diabetes medicines Take your diabetes medicines every day as told by your health care provider. List your diabetes medicines here: Name of medicine: ______________________________ Amount (dose):  _______________ Time (a.m./p.m.): _______________ Notes: ___________________________________ Name of medicine: ______________________________ Amount (dose): _______________ Time (a.m./p.m.): _______________ Notes: ___________________________________ Name of medicine: ______________________________ Amount (dose): _______________ Time (a.m./p.m.): _______________ Notes: ___________________________________ Insulin If you use insulin, list the types of insulin you use here: Insulin type: ______________________________ Amount (dose): _______________ Time (a.m./p.m.): _______________Notes: ___________________________________ Insulin type: ______________________________ Amount (dose): _______________ Time (a.m./p.m.): _______________ Notes: ___________________________________ Insulin type: ______________________________ Amount (dose): _______________ Time (a.m./p.m.): _______________ Notes: ___________________________________ Insulin type: ______________________________ Amount (dose): _______________ Time (a.m./p.m.): _______________ Notes: ___________________________________ Insulin type: ______________________________ Amount (dose): _______________ Time (a.m./p.m.): _______________ Notes: ___________________________________ Managing blood glucose  Check your blood glucose levels using a glucose monitor as told by your health care provider. Write down the times that you check your glucose levels here: Time: _______________ Notes: ___________________________________ Time: _______________ Notes: ___________________________________ Time: _______________ Notes: ___________________________________ Time: _______________ Notes: ___________________________________ Time: _______________ Notes: ___________________________________ Time: _______________ Notes: ___________________________________  Low blood glucose Low blood glucose (hypoglycemia) is when glucose is at or below 70 mg/dL (3.9 mmol/L).  Symptoms may include: Feeling: Hungry. Sweaty and clammy. Irritable or easily upset. Dizzy. Sleepy. Having: A fast heartbeat. A headache. A change in your vision. Numbness around the mouth, lips, or tongue. Having trouble with: Moving (coordination). Sleeping. Treating low blood glucose To treat low blood glucose, eat or drink something containing sugar right away. If you can think clearly and swallow safely, follow the 15:15 rule: Take 15 grams of a fast-acting carb (carbohydrate), as told by your health care provider. Some fast-acting carbs are: Glucose tablets: take 3-4 tablets. Hard candy: eat 3-5 pieces. Fruit juice: drink 4 oz (120 mL). Regular (not diet) soda: drink 4-6 oz (120-180 mL).   Honey or sugar: eat 1 Tbsp (15 mL). Check your blood glucose levels 15 minutes after you take the carb. If your glucose is still at or below 70 mg/dL (3.9 mmol/L), take 15 grams of a carb again. If your glucose does not go above 70 mg/dL (3.9 mmol/L) after 3 tries, get help right away. After your glucose goes back to normal, eat a meal or a snack within 1 hour. Treating very low blood glucose If your glucose is at or below 54 mg/dL (3 mmol/L), you have very low blood glucose (severe hypoglycemia). This is an emergency. Do not wait to see if the symptoms will go away. Get medical help right away. Call your local emergency services (911 in the [place] Do not drive yourself to the hospital. Questions to ask your health care provider Should I talk with a diabetes educator? What equipment will I need to care for myself at home? What diabetes medicines do I need? When should I take them? How often do I need to check my blood glucose levels? What number can I call if I have questions? When is my follow-up visit? Where can I find a support group for people with diabetes? Where to find more information American Diabetes Association: www.diabetes.org Association of Diabetes Care and Education  Specialists: www.diabeteseducator.org Contact a health care provider if: Your blood glucose is at or above 240 mg/dL (13.3 mmol/L) for 2 days in a row. You have been sick or have had a fever for 2 days or more, and you are not getting better. You have any of these problems for more than 6 hours: You cannot eat or drink. You feel nauseous. You vomit. You have diarrhea. Get help right away if: Your blood glucose is lower than 54 mg/dL (3 mmol/L). You get confused. You have trouble thinking clearly. You have trouble breathing. These symptoms may represent a serious problem that is an emergency. Do not wait to see if the symptoms will go away. Get medical help right away. Call your local emergency services (911 in the [place] Do not drive yourself to the hospital. Summary Diabetes mellitus is a chronic disease that occurs when the body does not properly use sugar (glucose) that is released from food after you eat. Take insulin and diabetes medicines as told. Check your blood glucose every day, as often as told. Keep all follow-up visits. This is important. This information is not intended to replace advice given to you by your health care provider. Make sure you discuss any questions you have with your health care provider. Document Revised: 06/28/2019 Document Reviewed: 06/28/2019 Elsevier Patient Education  2023 Elsevier Inc.  

## 2022-05-21 ENCOUNTER — Encounter: Payer: Self-pay | Admitting: Nurse Practitioner

## 2022-05-21 ENCOUNTER — Ambulatory Visit (INDEPENDENT_AMBULATORY_CARE_PROVIDER_SITE_OTHER): Payer: 59 | Admitting: Nurse Practitioner

## 2022-05-21 VITALS — BP 141/98 | HR 80 | Temp 97.9°F | Ht 62.05 in | Wt 204.3 lb

## 2022-05-21 DIAGNOSIS — E559 Vitamin D deficiency, unspecified: Secondary | ICD-10-CM

## 2022-05-21 DIAGNOSIS — F330 Major depressive disorder, recurrent, mild: Secondary | ICD-10-CM

## 2022-05-21 DIAGNOSIS — Z1211 Encounter for screening for malignant neoplasm of colon: Secondary | ICD-10-CM | POA: Diagnosis not present

## 2022-05-21 DIAGNOSIS — K219 Gastro-esophageal reflux disease without esophagitis: Secondary | ICD-10-CM

## 2022-05-21 DIAGNOSIS — E538 Deficiency of other specified B group vitamins: Secondary | ICD-10-CM

## 2022-05-21 DIAGNOSIS — F5101 Primary insomnia: Secondary | ICD-10-CM

## 2022-05-21 DIAGNOSIS — E1169 Type 2 diabetes mellitus with other specified complication: Secondary | ICD-10-CM | POA: Diagnosis not present

## 2022-05-21 DIAGNOSIS — E785 Hyperlipidemia, unspecified: Secondary | ICD-10-CM | POA: Diagnosis not present

## 2022-05-21 DIAGNOSIS — Z1231 Encounter for screening mammogram for malignant neoplasm of breast: Secondary | ICD-10-CM

## 2022-05-21 DIAGNOSIS — G2581 Restless legs syndrome: Secondary | ICD-10-CM | POA: Diagnosis not present

## 2022-05-21 DIAGNOSIS — E1159 Type 2 diabetes mellitus with other circulatory complications: Secondary | ICD-10-CM | POA: Diagnosis not present

## 2022-05-21 DIAGNOSIS — F419 Anxiety disorder, unspecified: Secondary | ICD-10-CM

## 2022-05-21 DIAGNOSIS — E669 Obesity, unspecified: Secondary | ICD-10-CM

## 2022-05-21 DIAGNOSIS — I152 Hypertension secondary to endocrine disorders: Secondary | ICD-10-CM

## 2022-05-21 DIAGNOSIS — G4700 Insomnia, unspecified: Secondary | ICD-10-CM | POA: Insufficient documentation

## 2022-05-21 DIAGNOSIS — Z0000 Encounter for general adult medical examination without abnormal findings: Secondary | ICD-10-CM

## 2022-05-21 LAB — MICROALBUMIN, URINE WAIVED
Creatinine, Urine Waived: 100 mg/dL (ref 10–300)
Microalb, Ur Waived: 80 mg/L — ABNORMAL HIGH (ref 0–19)

## 2022-05-21 LAB — BAYER DCA HB A1C WAIVED: HB A1C (BAYER DCA - WAIVED): 6.4 % — ABNORMAL HIGH (ref 4.8–5.6)

## 2022-05-21 MED ORDER — GLIPIZIDE 5 MG PO TABS: 5.0000 mg | ORAL_TABLET | Freq: Two times a day (BID) | ORAL | 4 refills | Status: DC

## 2022-05-21 MED ORDER — ATENOLOL 50 MG PO TABS: 50.0000 mg | ORAL_TABLET | Freq: Every day | ORAL | 4 refills | Status: DC

## 2022-05-21 MED ORDER — BUSPIRONE HCL 10 MG PO TABS: 10.0000 mg | ORAL_TABLET | Freq: Two times a day (BID) | ORAL | 4 refills | Status: DC

## 2022-05-21 MED ORDER — LOSARTAN POTASSIUM 100 MG PO TABS: 100.0000 mg | ORAL_TABLET | Freq: Every day | ORAL | 4 refills | Status: DC

## 2022-05-21 MED ORDER — SEMAGLUTIDE(0.25 OR 0.5MG/DOS) 2 MG/3ML ~~LOC~~ SOPN: PEN_INJECTOR | SUBCUTANEOUS | 5 refills | Status: DC

## 2022-05-21 MED ORDER — FENOFIBRATE 145 MG PO TABS: 145.0000 mg | ORAL_TABLET | Freq: Every day | ORAL | 4 refills | Status: DC

## 2022-05-21 MED ORDER — ATORVASTATIN CALCIUM 80 MG PO TABS: 80.0000 mg | ORAL_TABLET | Freq: Every day | ORAL | 4 refills | Status: DC

## 2022-05-21 MED ORDER — METFORMIN HCL 1000 MG PO TABS: 1000.0000 mg | ORAL_TABLET | Freq: Two times a day (BID) | ORAL | 4 refills | Status: DC

## 2022-05-21 MED ORDER — GABAPENTIN 100 MG PO CAPS: 100.0000 mg | ORAL_CAPSULE | Freq: Three times a day (TID) | ORAL | 3 refills | Status: DC

## 2022-05-21 MED ORDER — EZETIMIBE 10 MG PO TABS: 10.0000 mg | ORAL_TABLET | Freq: Every day | ORAL | 4 refills | Status: DC

## 2022-05-21 MED ORDER — CITALOPRAM HYDROBROMIDE 20 MG PO TABS: 20.0000 mg | ORAL_TABLET | Freq: Every day | ORAL | 4 refills | Status: DC

## 2022-05-21 MED ORDER — MECLIZINE HCL 25 MG PO TABS: 25.0000 mg | ORAL_TABLET | Freq: Three times a day (TID) | ORAL | 1 refills | Status: AC | PRN

## 2022-05-21 NOTE — Assessment & Plan Note (Signed)
Chronic, ongoing.  A1c down trend today to 6.4%, was 9.1% one year ago.  Urine ALB 80 March 2024, remain ARB on board for kidney protection.  We will attempt to restart Ozempic and stop Glipizide which would be more appropriate for long term diabetes health, she does agree with change to Trulicity if Ozempic not covered -- although prefers Ozempic.  Continue Metformin, she often takes once daily and Ozempic may help with sugars with her not adhering to twice a day dosing.  Recommend she check fasting sugars daily and document for visits.  Return in 4 weeks. - Eye and foot exam up to date - On ARB and statin therapy - wishes to think about pneumococcal vaccine

## 2022-05-21 NOTE — Assessment & Plan Note (Signed)
Chronic, ongoing.  Continue Omeprazole as needed only, not using daily.  Risks of PPI use were discussed with patient including bone loss, C. Diff diarrhea, pneumonia, infections, CKD, electrolyte abnormalities.  Verbalizes understanding and chooses to continue the medication.

## 2022-05-21 NOTE — Assessment & Plan Note (Signed)
Chronic, ongoing.  BP remains above goal today, <130/80.  Will increase Losartan to 100 MG and continue Atenolol at current dosing.  Discussed at length with patient.  Recommend she monitor BP at least a few mornings a week at home and document.  DASH diet at home.  Continue current medication regimen and adjust as needed.  Labs today: CBC, CMP, urine ALB, TSH.  Urine ALB 80 March 2024, continue ARB for protection.  Return in 4 weeks.

## 2022-05-21 NOTE — Assessment & Plan Note (Signed)
Chronic, ongoing.  Continue current medication regimen and adjust as needed. Lipid panel today. 

## 2022-05-21 NOTE — Assessment & Plan Note (Signed)
Chronic, stable without medication.  Monitor and start treatment as needed.

## 2022-05-21 NOTE — Progress Notes (Signed)
BP (!) 141/98   Pulse 80   Temp 97.9 F (36.6 C) (Oral)   Ht 5' 2.05" (1.576 m)   Wt 204 lb 4.8 oz (92.7 kg)   LMP  (LMP Unknown)   SpO2 97%   BMI 37.31 kg/m    Subjective:    Patient ID: Holly Norman, female    DOB: 1970-04-28, 52 y.o.   MRN: [id number]  HPI: Holly Norman is a 52 y.o. female presenting on 05/21/2022 for comprehensive medical examination. Current medical complaints include:none  She currently lives with: husband Menopausal Symptoms: hysterectomy 23 years ago, but still has ovaries  DIABETES A1c 9.1% in January 2023, lost to follow-up -- was without insurance for several months.  Took Metformin BID at that time, but often forgets second dose + is taking Glipizide (but often forgets second dose).  Was on Ozempic in past, which she preferred.  She reports Trulicity was painful to inject.   Hypoglycemic episodes:no Polydipsia/polyuria: no Visual disturbance: no Chest pain: no Paresthesias: no Glucose Monitoring: yes  Accucheck frequency: Not Checking  Fasting glucose:  Post prandial:  Evening:  Before meals: Taking Insulin?: no  Long acting insulin:  Short acting insulin: Blood Pressure Monitoring:  occasionally Retinal Examination: Up to Date Foot Exam: Up to Date Pneumovax: Not up to Date Influenza:  refuses Aspirin: no   HYPERTENSION / HYPERLIPIDEMIA Taking Atenolol, Losartan, + Atorvastatin and Zetia + Fenofibrate.  Gets headaches in afternoon at times + back of neck feels tight, thinks BP is up in afternoon. Satisfied with current treatment? yes Duration of hypertension: chronic BP monitoring frequency:  occasionally BP range: 140-150/80-90 range when checks BP medication side effects: no Duration of hyperlipidemia: chronic Cholesterol medication side effects: no Cholesterol supplements: none Medication compliance: good compliance Aspirin: no Recent stressors: no Recurrent headaches: no Visual changes: no Palpitations: no Dyspnea:  no Chest pain: no Lower extremity edema: no Dizzy/lightheaded: no   GERD Continues on Omeprazole as needed only. GERD control status: stable Satisfied with current treatment? yes Heartburn frequency: not often Medication side effects: no  Medication compliance: stable Dysphagia: no Odynophagia:  no Hematemesis: no Blood in stool: no EGD: no  DEPRESSION Continues on Celexa 10 MG daily.  Reports anxiety is elevated + can not sleep -- taking CBD gummies which helped at first, but now not helping. Can go to sleep, but then up at 2-3 am.    Has RLS no current medications taken. Mood status: exacerbated Satisfied with current treatment?: yes Symptom severity: moderate  Duration of current treatment : chronic Side effects: no Medication compliance: good compliance Psychotherapy/counseling: yes in past Previous psychiatric medications: Celexa Depressed mood: yes Anxious mood: yes Anhedonia: no Significant weight loss or gain: no Insomnia: yes hard to stay asleep Fatigue:  more in mid day Feelings of worthlessness or guilt: no Impaired concentration/indecisiveness: yes Suicidal ideations: no Hopelessness: no Crying spells: yes    05/21/2022   10:19 AM 04/24/2022   11:15 AM 07/25/2021    4:29 PM 07/18/2021    4:32 PM 04/02/2021    9:08 AM  Depression screen PHQ 2/9  Decreased Interest 0 0 0 0 0  Down, Depressed, Hopeless 0 0 0 0 0  PHQ - 2 Score 0 0 0 0 0  Altered sleeping '3 3 3 1 3  '$ Tired, decreased energy '1 1 1 1   '$ Change in appetite 0 0 0 0 0  Feeling bad or failure about yourself  0 0 0 0 0  Trouble concentrating '3 3 3 3 3  '$ Moving slowly or fidgety/restless 0 0 2 0 0  Suicidal thoughts 0 0 0 0 0  PHQ-9 Score '7 7 9 5 6  '$ Difficult doing work/chores Somewhat difficult  Very difficult  Not difficult at all       05/21/2022   10:19 AM 04/24/2022   11:15 AM 07/25/2021    4:29 PM 07/18/2021    4:32 PM  GAD 7 : Generalized Anxiety Score  Nervous, Anxious, on Edge '3 3 3 1   '$ Control/stop worrying '2 3 1 '$ 0  Worry too much - different things '2 3 1 '$ 0  Trouble relaxing '3 3 3 3  '$ Restless '3 3 3 3  '$ Easily annoyed or irritable '3 3 3 1  '$ Afraid - awful might happen 0 0 0 0  Total GAD 7 Score '16 18 14 8  '$ Anxiety Difficulty Somewhat difficult  Very difficult Not difficult at all        04/02/2021    9:08 AM 07/18/2021    4:32 PM 07/25/2021    4:29 PM 11/15/2021   10:27 AM 05/21/2022   10:19 AM  Fall Risk  Falls in the past year? 0 0 0 0 0  Was there an injury with Fall? 0 0 0 0 0  Fall Risk Category Calculator 0 0 0 0 0  Fall Risk Category (Retired) Low Low Low Low   (RETIRED) Patient Fall Risk Level  Low fall risk  Low fall risk   Patient at Risk for Falls Due to No Fall Risks No Fall Risks No Fall Risks No Fall Risks No Fall Risks  Fall risk Follow up Falls evaluation completed Falls evaluation completed Falls evaluation completed Falls evaluation completed Falls evaluation completed     Past Medical History:  Past Medical History:  Diagnosis Date   Diabetes mellitus without complication ([place])    Hyperlipidemia    Hypertension     Surgical History:  Past Surgical History:  Procedure Laterality Date   CARDIAC ELECTROPHYSIOLOGY STUDY AND ABLATION     CESAREAN SECTION     rt ankle surgery     TOTAL ABDOMINAL HYSTERECTOMY      Medications:  Current Outpatient Medications on File Prior to Visit  Medication Sig   albuterol (VENTOLIN HFA) 108 (90 Base) MCG/ACT inhaler Inhale 2 puffs into the lungs every 6 (six) hours as needed for wheezing or shortness of breath.   Omega-3 Fatty Acids (FISH OIL) 1000 MG CAPS Take 1,000 mg by mouth daily.   omeprazole (PRILOSEC) 20 MG capsule Take 1 capsule (20 mg total) by mouth daily.   No current facility-administered medications on file prior to visit.    Allergies:  Allergies  Allergen Reactions   Ivp Dye [Iodinated Contrast Media] Shortness Of Breath   Tape Rash    Social History:  Social History    Socioeconomic History   Marital status: Married    Spouse name: Not on file   Number of children: Not on file   Years of education: Not on file   Highest education level: Not on file  Occupational History   Not on file  Tobacco Use   Smoking status: Never   Smokeless tobacco: Never  Vaping Use   Vaping Use: Never used  Substance and Sexual Activity   Alcohol use: No    Alcohol/week: 0.0 standard drinks of alcohol   Drug use: No   Sexual activity: Yes  Other Topics Concern   Not  on file  Social History Narrative   Not on file   Social Determinants of Health   Financial Resource Strain: Low Risk  (10/05/2020)   Overall Financial Resource Strain (CARDIA)    Difficulty of Paying Living Expenses: Not very hard  Food Insecurity: No Food Insecurity (10/05/2020)   Hunger Vital Sign    Worried About Running Out of Food in the Last Year: Never true    [place] in the Last Year: Never true  Transportation Needs: No Transportation Needs (10/05/2020)   PRAPARE - Hydrologist (Medical): No    Lack of Transportation (Non-Medical): No  Physical Activity: Not on file  Stress: No Stress Concern Present (10/05/2020)   [place]    Feeling of Stress : Only a little  Social Connections: Unknown (10/05/2020)   Social Connection and Isolation Panel [NHANES]    Frequency of Communication with Friends and Family: More than three times a week    Frequency of Social Gatherings with Friends and Family: More than three times a week    Attends Religious Services: Not on file    Active Member of Clubs or Organizations: Not on file    Attends Archivist Meetings: Not on file    Marital Status: Married  Intimate Partner Violence: Not At Risk (10/05/2020)   Humiliation, Afraid, Rape, and Kick questionnaire    Fear of Current or Ex-Partner: No    Emotionally Abused: No    Physically Abused:  No    Sexually Abused: No   Social History   Tobacco Use  Smoking Status Never  Smokeless Tobacco Never   Social History   Substance and Sexual Activity  Alcohol Use No   Alcohol/week: 0.0 standard drinks of alcohol    Family History:  Family History  Problem Relation Age of Onset   Diabetes Mother    Heart disease Mother    Hypertension Mother    Heart disease Father 51   Diabetes Father    Hypertension Father    Stroke Father    Cancer Maternal Grandmother        breast and colon   Breast cancer Maternal Grandmother 65   Heart disease Maternal Grandfather    Heart disease Paternal Grandfather     Past medical history, surgical history, medications, allergies, family history and social history reviewed with patient today and changes made to appropriate areas of the chart.   ROS All other ROS negative except what is listed above and in the HPI.      Objective:    BP (!) 141/98   Pulse 80   Temp 97.9 F (36.6 C) (Oral)   Ht 5' 2.05" (1.576 m)   Wt 204 lb 4.8 oz (92.7 kg)   LMP  (LMP Unknown)   SpO2 97%   BMI 37.31 kg/m   Wt Readings from Last 3 Encounters:  05/21/22 204 lb 4.8 oz (92.7 kg)  04/24/22 204 lb 4.8 oz (92.7 kg)  04/05/22 192 lb (87.1 kg)    Physical Exam Vitals and nursing note reviewed. Exam conducted with a chaperone present.  Constitutional:      General: She is awake. She is not in acute distress.    Appearance: She is well-developed and well-groomed. She is not ill-appearing or toxic-appearing.  HENT:     Head: Normocephalic and atraumatic.     Right Ear: Hearing, tympanic membrane, ear canal and external ear  normal. No drainage.     Left Ear: Hearing, tympanic membrane, ear canal and external ear normal. No drainage.     Nose: Nose normal.     Right Sinus: No maxillary sinus tenderness or frontal sinus tenderness.     Left Sinus: No maxillary sinus tenderness or frontal sinus tenderness.     Mouth/Throat:     Mouth: Mucous  membranes are moist.     Pharynx: Oropharynx is clear. Uvula midline. No pharyngeal swelling, oropharyngeal exudate or posterior oropharyngeal erythema.  Eyes:     General: Lids are normal.        Right eye: No discharge.        Left eye: No discharge.     Extraocular Movements: Extraocular movements intact.     Conjunctiva/sclera: Conjunctivae normal.     Pupils: Pupils are equal, round, and reactive to light.     Visual Fields: Right eye visual fields normal and left eye visual fields normal.  Neck:     Thyroid: No thyromegaly.     Vascular: No carotid bruit.     Trachea: Trachea normal.  Cardiovascular:     Rate and Rhythm: Normal rate and regular rhythm.     Heart sounds: Normal heart sounds. No murmur heard.    No gallop.  Pulmonary:     Effort: Pulmonary effort is normal. No accessory muscle usage or respiratory distress.     Breath sounds: Normal breath sounds.  Chest:  Breasts:    Right: Normal.     Left: Normal.  Abdominal:     General: Bowel sounds are normal.     Palpations: Abdomen is soft. There is no hepatomegaly or splenomegaly.     Tenderness: There is no abdominal tenderness.  Musculoskeletal:        General: Normal range of motion.     Cervical back: Normal range of motion and neck supple.     Right lower leg: No edema.     Left lower leg: No edema.  Lymphadenopathy:     Head:     Right side of head: No submental, submandibular, tonsillar, preauricular or posterior auricular adenopathy.     Left side of head: No submental, submandibular, tonsillar, preauricular or posterior auricular adenopathy.     Cervical: No cervical adenopathy.     Upper Body:     Right upper body: No supraclavicular, axillary or pectoral adenopathy.     Left upper body: No supraclavicular, axillary or pectoral adenopathy.  Skin:    General: Skin is warm and dry.     Capillary Refill: Capillary refill takes less than 2 seconds.     Findings: No rash.  Neurological:     Mental  Status: She is alert and oriented to person, place, and time.     Gait: Gait is intact.     Deep Tendon Reflexes: Reflexes are normal and symmetric.     Reflex Scores:      Brachioradialis reflexes are 2+ on the right side and 2+ on the left side.      Patellar reflexes are 2+ on the right side and 2+ on the left side. Psychiatric:        Attention and Perception: Attention normal.        Mood and Affect: Mood normal.        Speech: Speech normal.        Behavior: Behavior normal. Behavior is cooperative.        Thought Content: Thought content normal.  Judgment: Judgment normal.    Diabetic Foot Exam - Simple   Simple Foot Form Visual Inspection See comments: Yes Sensation Testing See comments: Yes Pulse Check See comments: Yes Comments     Results for orders placed or performed in visit on 05/21/22  Bayer DCA Hb A1c Waived  Result Value Ref Range   HB A1C (BAYER DCA - WAIVED) 6.4 (H) 4.8 - 5.6 %  Microalbumin, Urine Waived  Result Value Ref Range   Microalb, Ur Waived 80 (H) 0 - 19 mg/L   Creatinine, Urine Waived 100 10 - 300 mg/dL   Microalb/Creat Ratio 30-300 (H) <30 mg/g      Assessment & Plan:   Problem List Items Addressed This Visit       Cardiovascular and Mediastinum   Hypertension associated with diabetes (HCC)    Chronic, ongoing.  BP remains above goal today, <130/80.  Will increase Losartan to 100 MG and continue Atenolol at current dosing.  Discussed at length with patient.  Recommend she monitor BP at least a few mornings a week at home and document.  DASH diet at home.  Continue current medication regimen and adjust as needed.  Labs today: CBC, CMP, urine ALB, TSH.  Urine ALB 80 March 2024, continue ARB for protection.  Return in 4 weeks.       Relevant Medications   atorvastatin (LIPITOR) 80 MG tablet   ezetimibe (ZETIA) 10 MG tablet   fenofibrate (TRICOR) 145 MG tablet   glipiZIDE (GLUCOTROL) 5 MG tablet   metFORMIN (GLUCOPHAGE) 1000 MG  tablet   losartan (COZAAR) 100 MG tablet   atenolol (TENORMIN) 50 MG tablet   Semaglutide,0.25 or 0.'5MG'$ /DOS, 2 MG/3ML SOPN   Other Relevant Orders   Bayer DCA Hb A1c Waived (Completed)   Microalbumin, Urine Waived (Completed)   CBC with Differential/Platelet   Comprehensive metabolic panel   TSH     Digestive   GERD (gastroesophageal reflux disease)    Chronic, ongoing.  Continue Omeprazole as needed only, not using daily.  Risks of PPI use were discussed with patient including bone loss, C. Diff diarrhea, pneumonia, infections, CKD, electrolyte abnormalities.  Verbalizes understanding and chooses to continue the medication.       Relevant Medications   meclizine (ANTIVERT) 25 MG tablet   Other Relevant Orders   Magnesium     Endocrine   Hyperlipidemia associated with type 2 diabetes mellitus (HCC)    Chronic, ongoing.  Continue current medication regimen and adjust as needed.  Lipid panel today.      Relevant Medications   atorvastatin (LIPITOR) 80 MG tablet   ezetimibe (ZETIA) 10 MG tablet   fenofibrate (TRICOR) 145 MG tablet   glipiZIDE (GLUCOTROL) 5 MG tablet   metFORMIN (GLUCOPHAGE) 1000 MG tablet   losartan (COZAAR) 100 MG tablet   atenolol (TENORMIN) 50 MG tablet   Semaglutide,0.25 or 0.'5MG'$ /DOS, 2 MG/3ML SOPN   Other Relevant Orders   Bayer DCA Hb A1c Waived (Completed)   Comprehensive metabolic panel   Lipid Panel w/o Chol/HDL Ratio   Type 2 diabetes mellitus with obesity (HCC) - Primary    Chronic, ongoing.  A1c down trend today to 6.4%, was 9.1% one year ago.  Urine ALB 80 March 2024, remain ARB on board for kidney protection.  We will attempt to restart Ozempic and stop Glipizide which would be more appropriate for long term diabetes health, she does agree with change to Trulicity if Ozempic not covered -- although prefers Ozempic.  Continue  Metformin, she often takes once daily and Ozempic may help with sugars with her not adhering to twice a day dosing.   Recommend she check fasting sugars daily and document for visits.  Return in 4 weeks. - Eye and foot exam up to date - On ARB and statin therapy - wishes to think about pneumococcal vaccine      Relevant Medications   atorvastatin (LIPITOR) 80 MG tablet   glipiZIDE (GLUCOTROL) 5 MG tablet   metFORMIN (GLUCOPHAGE) 1000 MG tablet   losartan (COZAAR) 100 MG tablet   Semaglutide,0.25 or 0.'5MG'$ /DOS, 2 MG/3ML SOPN   Other Relevant Orders   Bayer DCA Hb A1c Waived (Completed)   Microalbumin, Urine Waived (Completed)     Other   Anxiety    Refer to depression plan of care.      Relevant Medications   citalopram (CELEXA) 20 MG tablet   busPIRone (BUSPAR) 10 MG tablet   Depression    Chronic, ongoing, worsening anxiety.  Suspect may be more hormone driven, has ovaries still in place.  Denies SI/HI.  Increase Celexa to 20 MG daily and add on Buspar 10 MG daily in morning (does not do well with BID dosing).  Educated her on changes.  Recommend meditation and relaxation techniques.  Return in 4 weeks.      Relevant Medications   citalopram (CELEXA) 20 MG tablet   busPIRone (BUSPAR) 10 MG tablet   Insomnia    Ongoing issue.  ?hormone driven at this time and increased anxiety present. Will trial Gabapentin 100 MG at night for sleep and mood.  Educated her on this and side effects.        Restless legs    Chronic, stable without medication.  Monitor and start treatment as needed.      Other Visit Diagnoses     Vitamin D deficiency       History of low levels reported, check today and start supplement as needed.   Relevant Orders   VITAMIN D 25 Hydroxy (Vit-D Deficiency, Fractures)   Vitamin B12 deficiency       History of low levels reported, check today and start supplement as needed.   Relevant Orders   Vitamin B12   Encounter for screening mammogram for malignant neoplasm of breast       Mammogram ordered   Relevant Orders   MM 3D SCREENING MAMMOGRAM BILATERAL BREAST   Colon  cancer screening       Referral to Gi placed.   Relevant Orders   Ambulatory referral to Gastroenterology   Encounter for annual physical exam       Annual physical today with labs and health maintenance reviewed, discussed with patient.        Follow up plan: Return in about 4 weeks (around 06/18/2022) for MOOD AND HTN.   LABORATORY TESTING:  - Pap smear: not applicable  IMMUNIZATIONS:   - Tdap: Tetanus vaccination status reviewed: last tetanus booster within 10 years. - Influenza: Refused - Pneumovax: Not applicable - Prevnar:  talked about, wishes to think about this - COVID: Up to date - HPV: Not applicable - Shingrix vaccine:  wishes to think about this  SCREENING: -Mammogram: Ordered today  - Colonoscopy: Ordered today  - Bone Density: Not applicable  -Hearing Test: Not applicable  -Spirometry: Not applicable   PATIENT COUNSELING:   Advised to take 1 mg of folate supplement per day if capable of pregnancy.   Sexuality: Discussed sexually transmitted diseases, partner selection, use  of condoms, avoidance of unintended pregnancy  and contraceptive alternatives.   Advised to avoid cigarette smoking.  I discussed with the patient that most people either abstain from alcohol or drink within safe limits (<=14/week and <=4 drinks/occasion for males, <=7/weeks and <= 3 drinks/occasion for females) and that the risk for alcohol disorders and other health effects rises proportionally with the number of drinks per week and how often a drinker exceeds daily limits.  Discussed cessation/primary prevention of drug use and availability of treatment for abuse.   Diet: Encouraged to adjust caloric intake to maintain  or achieve ideal body weight, to reduce intake of dietary saturated fat and total fat, to limit sodium intake by avoiding high sodium foods and not adding table salt, and to maintain adequate dietary potassium and calcium preferably from fresh fruits, vegetables, and  low-fat dairy products.    Stressed the importance of regular exercise  Injury prevention: Discussed safety belts, safety helmets, smoke detector, smoking near bedding or upholstery.   Dental health: Discussed importance of regular tooth brushing, flossing, and dental visits.    NEXT PREVENTATIVE PHYSICAL DUE IN 1 YEAR. Return in about 4 weeks (around 06/18/2022) for MOOD AND HTN.

## 2022-05-21 NOTE — Assessment & Plan Note (Signed)
Refer to depression plan of care. 

## 2022-05-21 NOTE — Assessment & Plan Note (Addendum)
Chronic, ongoing, worsening anxiety.  Suspect may be more hormone driven, has ovaries still in place.  Denies SI/HI.  Increase Celexa to 20 MG daily and add on Buspar 10 MG daily in morning (does not do well with BID dosing).  Educated her on changes.  Recommend meditation and relaxation techniques.  Return in 4 weeks.

## 2022-05-21 NOTE — Assessment & Plan Note (Signed)
Ongoing issue.  ?hormone driven at this time and increased anxiety present. Will trial Gabapentin 100 MG at night for sleep and mood.  Educated her on this and side effects.

## 2022-05-22 ENCOUNTER — Other Ambulatory Visit: Payer: Self-pay | Admitting: Nurse Practitioner

## 2022-05-22 LAB — CBC WITH DIFFERENTIAL/PLATELET
Basophils Absolute: 0 10*3/uL (ref 0.0–0.2)
Basos: 1 %
EOS (ABSOLUTE): 0.2 10*3/uL (ref 0.0–0.4)
Eos: 5 %
Hematocrit: 42.1 % (ref 34.0–46.6)
Hemoglobin: 14.1 g/dL (ref 11.1–15.9)
Immature Grans (Abs): 0 10*3/uL (ref 0.0–0.1)
Immature Granulocytes: 0 %
Lymphocytes Absolute: 1.4 10*3/uL (ref 0.7–3.1)
Lymphs: 29 %
MCH: 31.3 pg (ref 26.6–33.0)
MCHC: 33.5 g/dL (ref 31.5–35.7)
MCV: 94 fL (ref 79–97)
Monocytes Absolute: 0.3 10*3/uL (ref 0.1–0.9)
Monocytes: 6 %
Neutrophils Absolute: 2.8 10*3/uL (ref 1.4–7.0)
Neutrophils: 59 %
Platelets: 229 10*3/uL (ref 150–450)
RBC: 4.5 x10E6/uL (ref 3.77–5.28)
RDW: 13.1 % (ref 11.7–15.4)
WBC: 4.8 10*3/uL (ref 3.4–10.8)

## 2022-05-22 LAB — COMPREHENSIVE METABOLIC PANEL
ALT: 16 IU/L (ref 0–32)
AST: 20 IU/L (ref 0–40)
Albumin/Globulin Ratio: 1.8 (ref 1.2–2.2)
Albumin: 4.3 g/dL (ref 3.8–4.9)
Alkaline Phosphatase: 69 IU/L (ref 44–121)
BUN/Creatinine Ratio: 13 (ref 9–23)
BUN: 11 mg/dL (ref 6–24)
Bilirubin Total: 0.9 mg/dL (ref 0.0–1.2)
CO2: 22 mmol/L (ref 20–29)
Calcium: 9.8 mg/dL (ref 8.7–10.2)
Chloride: 104 mmol/L (ref 96–106)
Creatinine, Ser: 0.88 mg/dL (ref 0.57–1.00)
Globulin, Total: 2.4 g/dL (ref 1.5–4.5)
Glucose: 125 mg/dL — ABNORMAL HIGH (ref 70–99)
Potassium: 4.3 mmol/L (ref 3.5–5.2)
Sodium: 141 mmol/L (ref 134–144)
Total Protein: 6.7 g/dL (ref 6.0–8.5)
eGFR: 79 mL/min/{1.73_m2} (ref >59)

## 2022-05-22 LAB — LIPID PANEL W/O CHOL/HDL RATIO
Cholesterol, Total: 242 mg/dL — ABNORMAL HIGH (ref 100–199)
HDL: 43 mg/dL (ref >39)
LDL Chol Calc (NIH): 160 mg/dL — ABNORMAL HIGH (ref 0–99)
Triglycerides: 210 mg/dL — ABNORMAL HIGH (ref 0–149)
VLDL Cholesterol Cal: 39 mg/dL (ref 5–40)

## 2022-05-22 LAB — VITAMIN B12: Vitamin B-12: 277 pg/mL (ref 232–1245)

## 2022-05-22 LAB — TSH: TSH: 1.71 u[IU]/mL (ref 0.450–4.500)

## 2022-05-22 LAB — VITAMIN D 25 HYDROXY (VIT D DEFICIENCY, FRACTURES): Vit D, 25-Hydroxy: 8 ng/mL — ABNORMAL LOW (ref 30.0–100.0)

## 2022-05-22 LAB — MAGNESIUM: Magnesium: 1.9 mg/dL (ref 1.6–2.3)

## 2022-05-22 MED ORDER — VITAMIN B-12 1000 MCG PO TABS: 1000.0000 ug | ORAL_TABLET | Freq: Every day | ORAL | 4 refills | Status: AC

## 2022-05-22 MED ORDER — CHOLECALCIFEROL 1.25 MG (50000 UT) PO TABS: 1.0000 | ORAL_TABLET | ORAL | 4 refills | Status: DC

## 2022-05-22 MED ORDER — ROSUVASTATIN CALCIUM 40 MG PO TABS: 40.0000 mg | ORAL_TABLET | Freq: Every day | ORAL | 4 refills | Status: DC

## 2022-05-22 NOTE — Progress Notes (Signed)
Contacted via MyChart   Good evening Holly Norman, your labs have returned: - Kidney function, creatinine and eGFR, remains normal, as is liver function, AST and ALT.  - CBC shows no anemia or infection.  Thyroid lab, TSH, is normal. - Vitamin D level is very low, this can affect many areas of health.  I am sending in a weekly high dose Vitamin D3 for you to start taking.  Your B12 level is also very low.  I recommend we start Vitamin B12 1000 MCG daily for overall nervous system health.   - Cholesterol labs are very high, are you taking Atorvastatin daily?  I am going to change this to Rosuvastatin which may offer more benefit to levels.  We will stop Atorvastatin and start Rosuvastatin -- then recheck levels with you fasting next visit.  Any questions? Keep being amazing!!  Thank you for allowing me to participate in your care.  I appreciate you. Kindest regards, Ayeza Therriault

## 2022-05-29 ENCOUNTER — Encounter: Payer: Self-pay | Admitting: *Deleted

## 2022-06-04 ENCOUNTER — Encounter: Payer: Self-pay | Admitting: Nurse Practitioner

## 2022-06-04 ENCOUNTER — Other Ambulatory Visit: Payer: Self-pay | Admitting: Nurse Practitioner

## 2022-06-04 MED ORDER — ROSUVASTATIN CALCIUM 40 MG PO TABS: 40.0000 mg | ORAL_TABLET | Freq: Every day | ORAL | 4 refills | Status: DC

## 2022-06-04 MED ORDER — CHOLECALCIFEROL 1.25 MG (50000 UT) PO TABS: 1.0000 | ORAL_TABLET | ORAL | 4 refills | Status: DC

## 2022-06-17 ENCOUNTER — Other Ambulatory Visit: Payer: Self-pay | Admitting: Nurse Practitioner

## 2022-06-17 DIAGNOSIS — N63 Unspecified lump in unspecified breast: Secondary | ICD-10-CM

## 2022-06-17 DIAGNOSIS — R928 Other abnormal and inconclusive findings on diagnostic imaging of breast: Secondary | ICD-10-CM

## 2022-06-18 ENCOUNTER — Ambulatory Visit: Payer: 59 | Admitting: Nurse Practitioner

## 2022-06-18 DIAGNOSIS — F419 Anxiety disorder, unspecified: Secondary | ICD-10-CM

## 2022-06-18 DIAGNOSIS — E1169 Type 2 diabetes mellitus with other specified complication: Secondary | ICD-10-CM

## 2022-06-18 DIAGNOSIS — F330 Major depressive disorder, recurrent, mild: Secondary | ICD-10-CM

## 2022-06-18 DIAGNOSIS — E538 Deficiency of other specified B group vitamins: Secondary | ICD-10-CM

## 2022-06-18 DIAGNOSIS — E1159 Type 2 diabetes mellitus with other circulatory complications: Secondary | ICD-10-CM

## 2022-06-18 DIAGNOSIS — E559 Vitamin D deficiency, unspecified: Secondary | ICD-10-CM

## 2022-07-03 ENCOUNTER — Ambulatory Visit
Admission: RE | Admit: 2022-07-03 | Discharge: 2022-07-03 | Disposition: A | Discharge status: Home / Self Care | Payer: 59 | Source: Ambulatory Visit | Attending: Nurse Practitioner | Admitting: Nurse Practitioner

## 2022-07-03 DIAGNOSIS — R928 Other abnormal and inconclusive findings on diagnostic imaging of breast: Secondary | ICD-10-CM | POA: Insufficient documentation

## 2022-07-03 DIAGNOSIS — N6313 Unspecified lump in the right breast, lower outer quadrant: Secondary | ICD-10-CM | POA: Diagnosis not present

## 2022-07-03 DIAGNOSIS — N63 Unspecified lump in unspecified breast: Secondary | ICD-10-CM

## 2022-07-03 DIAGNOSIS — R92323 Mammographic fibroglandular density, bilateral breasts: Secondary | ICD-10-CM | POA: Diagnosis not present

## 2022-07-12 DIAGNOSIS — E538 Deficiency of other specified B group vitamins: Secondary | ICD-10-CM | POA: Insufficient documentation

## 2022-07-12 DIAGNOSIS — E559 Vitamin D deficiency, unspecified: Secondary | ICD-10-CM | POA: Insufficient documentation

## 2022-07-14 ENCOUNTER — Ambulatory Visit: Payer: 59 | Admitting: Nurse Practitioner

## 2022-07-14 DIAGNOSIS — E559 Vitamin D deficiency, unspecified: Secondary | ICD-10-CM

## 2022-07-14 DIAGNOSIS — E1169 Type 2 diabetes mellitus with other specified complication: Secondary | ICD-10-CM

## 2022-07-14 DIAGNOSIS — F419 Anxiety disorder, unspecified: Secondary | ICD-10-CM

## 2022-07-14 DIAGNOSIS — E538 Deficiency of other specified B group vitamins: Secondary | ICD-10-CM

## 2022-07-14 DIAGNOSIS — F330 Major depressive disorder, recurrent, mild: Secondary | ICD-10-CM

## 2022-07-24 ENCOUNTER — Encounter: Payer: Self-pay | Admitting: Family Medicine

## 2022-07-24 ENCOUNTER — Encounter: Payer: Self-pay | Admitting: Nurse Practitioner

## 2022-07-24 ENCOUNTER — Telehealth (INDEPENDENT_AMBULATORY_CARE_PROVIDER_SITE_OTHER): Payer: 59 | Admitting: Family Medicine

## 2022-07-24 DIAGNOSIS — M5431 Sciatica, right side: Secondary | ICD-10-CM

## 2022-07-24 MED ORDER — NAPROXEN 500 MG PO TABS
500.0000 mg | ORAL_TABLET | Freq: Two times a day (BID) | ORAL | 1 refills | Status: DC
Start: 2022-07-24 — End: 2022-11-07

## 2022-07-24 MED ORDER — CYCLOBENZAPRINE HCL 10 MG PO TABS: 10.0000 mg | ORAL_TABLET | Freq: Every day | ORAL | 1 refills | Status: DC

## 2022-07-24 NOTE — Progress Notes (Signed)
LMP  (LMP Unknown)    Subjective:    Patient ID: Holly Norman, female    DOB: [birth year], 52 y.o.   MRN: [id number]  HPI: Holly Norman is a 52 y.o. female  Chief Complaint  Patient presents with   Sciatica    Patient says the R side of her buttock. Patient says she is very uncomfortable with movements or motion and says it is radiating up to her waist and she is miserable. Patient says she has been trying warm epsom salt baths and using cold compress to help relieve the pain and discomfort.    BACK PAIN Duration: a couple of weeks, but much worse in the past 5 days Mechanism of injury: was cleaning, no trauma Location: Right and low back Onset: sudden Severity: severe Quality: shooting Frequency: intermittent Radiation: R leg above the knee Aggravating factors: moving Alleviating factors: sitting still Status: fluctuating Treatments attempted: ice and heat, and tylenol PM, advil Relief with NSAIDs?: mild Nighttime pain:  yes Paresthesias / decreased sensation:  no Bowel / bladder incontinence:  no Fevers:  no Dysuria / urinary frequency:  no  Relevant past medical, surgical, family and social history reviewed and updated as indicated. Interim medical history since our last visit reviewed. Allergies and medications reviewed and updated.  Review of Systems  Constitutional: Negative.   Respiratory: Negative.    Cardiovascular: Negative.   Musculoskeletal:  Positive for back pain and myalgias. Negative for arthralgias, gait problem, joint swelling, neck pain and neck stiffness.  Skin: Negative.   Neurological: Negative.   Psychiatric/Behavioral: Negative.      Per HPI unless specifically indicated above     Objective:    LMP  (LMP Unknown)   Wt Readings from Last 3 Encounters:  05/21/22 204 lb 4.8 oz (92.7 kg)  04/24/22 204 lb 4.8 oz (92.7 kg)  04/05/22 192 lb (87.1 kg)    Physical Exam Vitals and nursing note reviewed.  Constitutional:      General:  She is not in acute distress.    Appearance: Normal appearance. She is normal weight. She is not ill-appearing, toxic-appearing or diaphoretic.  HENT:     Head: Normocephalic and atraumatic.     Right Ear: External ear normal.     Left Ear: External ear normal.     Nose: Nose normal.     Mouth/Throat:     Mouth: Mucous membranes are moist.     Pharynx: Oropharynx is clear.  Eyes:     General: No scleral icterus.       Right eye: No discharge.        Left eye: No discharge.     Conjunctiva/sclera: Conjunctivae normal.     Pupils: Pupils are equal, round, and reactive to light.  Pulmonary:     Effort: Pulmonary effort is normal. No respiratory distress.     Comments: Speaking in full sentences Musculoskeletal:        General: Normal range of motion.     Cervical back: Normal range of motion.  Skin:    Coloration: Skin is not jaundiced or pale.     Findings: No bruising, erythema, lesion or rash.  Neurological:     Mental Status: She is alert and oriented to person, place, and time. Mental status is at baseline.  Psychiatric:        Mood and Affect: Mood normal.        Behavior: Behavior normal.        Thought Content:  Thought content normal.        Judgment: Judgment normal.     Results for orders placed or performed in visit on 05/21/22  Bayer DCA Hb A1c Waived  Result Value Ref Range   HB A1C (BAYER DCA - WAIVED) 6.4 (H) 4.8 - 5.6 %  Microalbumin, Urine Waived  Result Value Ref Range   Microalb, Ur Waived 80 (H) 0 - 19 mg/L   Creatinine, Urine Waived 100 10 - 300 mg/dL   Microalb/Creat Ratio 30-300 (H) <30 mg/g  CBC with Differential/Platelet  Result Value Ref Range   WBC 4.8 3.4 - 10.8 x10E3/uL   RBC 4.50 3.77 - 5.28 x10E6/uL   Hemoglobin 14.1 11.1 - 15.9 g/dL   Hematocrit 16.1 09.6 - 46.6 %   MCV 94 79 - 97 fL   MCH 31.3 26.6 - 33.0 pg   MCHC 33.5 31.5 - 35.7 g/dL   RDW 04.5 40.9 - 81.1 %   Platelets 229 150 - 450 x10E3/uL   Neutrophils 59 Not Estab. %    Lymphs 29 Not Estab. %   Monocytes 6 Not Estab. %   Eos 5 Not Estab. %   Basos 1 Not Estab. %   Neutrophils Absolute 2.8 1.4 - 7.0 x10E3/uL   Lymphocytes Absolute 1.4 0.7 - 3.1 x10E3/uL   Monocytes Absolute 0.3 0.1 - 0.9 x10E3/uL   EOS (ABSOLUTE) 0.2 0.0 - 0.4 x10E3/uL   Basophils Absolute 0.0 0.0 - 0.2 x10E3/uL   Immature Granulocytes 0 Not Estab. %   Immature Grans (Abs) 0.0 0.0 - 0.1 x10E3/uL  Comprehensive metabolic panel  Result Value Ref Range   Glucose 125 (H) 70 - 99 mg/dL   BUN 11 6 - 24 mg/dL   Creatinine, Ser 9.14 0.57 - 1.00 mg/dL   eGFR 79 >78 GN/FAO/1.30   BUN/Creatinine Ratio 13 9 - 23   Sodium 141 134 - 144 mmol/L   Potassium 4.3 3.5 - 5.2 mmol/L   Chloride 104 96 - 106 mmol/L   CO2 22 20 - 29 mmol/L   Calcium 9.8 8.7 - 10.2 mg/dL   Total Protein 6.7 6.0 - 8.5 g/dL   Albumin 4.3 3.8 - 4.9 g/dL   Globulin, Total 2.4 1.5 - 4.5 g/dL   Albumin/Globulin Ratio 1.8 1.2 - 2.2   Bilirubin Total 0.9 0.0 - 1.2 mg/dL   Alkaline Phosphatase 69 44 - 121 IU/L   AST 20 0 - 40 IU/L   ALT 16 0 - 32 IU/L  Lipid Panel w/o Chol/HDL Ratio  Result Value Ref Range   Cholesterol, Total 242 (H) 100 - 199 mg/dL   Triglycerides 865 (H) 0 - 149 mg/dL   HDL 43 >78 mg/dL   VLDL Cholesterol Cal 39 5 - 40 mg/dL   LDL Chol Calc (NIH) 469 (H) 0 - 99 mg/dL  TSH  Result Value Ref Range   TSH 1.710 0.450 - 4.500 uIU/mL  VITAMIN D 25 Hydroxy (Vit-D Deficiency, Fractures)  Result Value Ref Range   Vit D, 25-Hydroxy 8.0 (L) 30.0 - 100.0 ng/mL  Vitamin B12  Result Value Ref Range   Vitamin B-12 277 232 - 1,245 pg/mL  Magnesium  Result Value Ref Range   Magnesium 1.9 1.6 - 2.3 mg/dL      Assessment & Plan:   Problem List Items Addressed This Visit   None Visit Diagnoses     Sciatica of right side    -  Primary   Will treat with naproxen, flexeril and stretches.  Call with any concerns. Follow up with PCP in 2 weeks.   Relevant Medications   cyclobenzaprine (FLEXERIL) 10 MG tablet         Follow up plan: Return in about 1 week (around 07/31/2022) for with PCP.    This visit was completed via video visit through MyChart due to the restrictions of the COVID-19 pandemic. All issues as above were discussed and addressed. Physical exam was done as above through visual confirmation on video through MyChart. If it was felt that the patient should be evaluated in the office, they were directed there. The patient verbally consented to this visit. Location of the patient: home Location of the provider: work Those involved with this call:  Provider: Olevia Perches, DO CMA: Malen Gauze, CMA Front Desk/Registration:  Servando Snare   Time spent on call:  15 minutes with patient face to face via video conference. More than 50% of this time was spent in counseling and coordination of care. 23 minutes total spent in review of patient's record and preparation of their chart.

## 2022-07-24 NOTE — Telephone Encounter (Signed)
Scheduled appointment today with Dr. Laural Benes @ 4:00 pm.

## 2022-07-25 NOTE — Telephone Encounter (Signed)
Pt already had an appointment yesterday.

## 2022-07-25 NOTE — Progress Notes (Signed)
Attempted to reach patient to schedule her for a 2 week follow up with her PCP.  LVM to call office back.  Also put in CRM.

## 2022-07-31 ENCOUNTER — Ambulatory Visit: Payer: 59 | Admitting: Nurse Practitioner

## 2022-10-23 ENCOUNTER — Encounter: Payer: Self-pay | Admitting: Nurse Practitioner

## 2022-11-05 NOTE — Patient Instructions (Addendum)
Inject 0.25 MG subcutaneous into skin weekly for 4 weeks and then on week 5 start injecting 0.5 MG weekly into the skin   Be Involved in Caring For Your Health:  Taking Medications When medications are taken as directed, they can greatly improve your health. But if they are not taken as prescribed, they may not work. In some cases, not taking them correctly can be harmful. To help ensure your treatment remains effective and safe, understand your medications and how to take them. Bring your medications to each visit for review by your provider.  Your lab results, notes, and after visit summary will be available on My Chart. We strongly encourage you to use this feature. If lab results are abnormal the clinic will contact you with the appropriate steps. If the clinic does not contact you assume the results are satisfactory. You can always view your results on My Chart. If you have questions regarding your health or results, please contact the clinic during office hours. You can also ask questions on My Chart.  We at [facility] are grateful that you chose [place] to provide your care. We strive to provide evidence-based and compassionate care and are always looking for feedback. If you get a survey from the clinic please complete this so we can hear your opinions.  Sciatica  Sciatica is pain, weakness, tingling, or loss of feeling (numbness) along the sciatic nerve. The sciatic nerve starts in the lower back and goes down the back of each leg. Sciatica usually affects one side of the body. Sciatica usually goes away on its own or with treatment. Sometimes, sciatica may come back. What are the causes? This condition happens when the sciatic nerve is pinched or has pressure put on it. This may be caused by: A disk in between the bones of the spine bulging out too far (herniated disk). Changes in the spinal disks due to aging. A condition that affects a muscle in the butt. Extra bone growth  near the sciatic nerve. A break (fracture) of the area between your hip bones (pelvis). Pregnancy. Tumor. This is rare. What increases the risk? You are more likely to develop this condition if you: Play sports that put pressure or stress on the spine. Have poor strength and ease of movement (flexibility). Have had a back injury or back surgery. Sit for long periods of time. Do activities that involve bending or lifting over and over again. Are very overweight (obese). What are the signs or symptoms? Symptoms can vary from mild to very bad. They may include: Any of these problems in the lower back, leg, hip, or butt: Mild tingling, loss of feeling, or dull aches. A burning feeling. Sharp pains. Loss of feeling in the back of the calf or the sole of the foot. Leg weakness. Very bad back pain that makes it hard to move. These symptoms may get worse when you cough, sneeze, or laugh. They may also get worse when you sit or stand for long periods of time. How is this treated? This condition often gets better without any treatment. However, treatment may include: Changing or cutting back on physical activity when you have pain. Exercising, including strengthening and stretching. Putting ice or heat on the affected area. Shots of medicines to relieve pain and swelling or to relax your muscles. Surgery. Follow these instructions at home: Medicines Take over-the-counter and prescription medicines only as told by your doctor. Ask your doctor if you should avoid driving or using machines  while you are taking your medicine. Managing pain     If told, put ice on the affected area. To do this: Put ice in a plastic bag. Place a towel between your skin and the bag. Leave the ice on for 20 minutes, 2-3 times a day. If your skin turns bright red, take off the ice right away to prevent skin damage. The risk of skin damage is higher if you cannot feel pain, heat, or cold. If told, put heat on  the affected area. Do this as often as told by your doctor. Use the heat source that your doctor tells you to use, such as a moist heat pack or a heating pad. Place a towel between your skin and the heat source. Leave the heat on for 20-30 minutes. If your skin turns bright red, take off the heat right away to prevent burns. The risk of burns is higher if you cannot feel pain, heat, or cold. Activity  Return to your normal activities when your doctor says that it is safe. Avoid activities that make your symptoms worse. Take short rests during the day. When you rest for a long time, do some physical activity or stretching between periods of rest. Avoid sitting for a long time without moving. Get up and move around at least one time each hour. Do exercises and stretches as told by your doctor. Do not lift anything that is heavier than 10 lb (4.5 kg). Avoid lifting heavy things even when you do not have symptoms. Avoid lifting heavy things over and over. When you lift objects, always lift in a way that is safe for your body. To do this, you should: Bend your knees. Keep the object close to your body. Avoid twisting. General instructions Stay at a healthy weight. Wear comfortable shoes that support your feet. Avoid wearing high heels. Avoid sleeping on a mattress that is too soft or too hard. You might have less pain if you sleep on a mattress that is firm enough to support your back. Contact a doctor if: Your pain is not controlled by medicine. Your pain does not get better. Your pain gets worse. Your pain lasts longer than 4 weeks. You lose weight without trying. Get help right away if: You cannot control when you pee (urinate) or poop (have a bowel movement). You have weakness in any of these areas and it gets worse: Lower back. The area between your hip bones. Butt. Legs. You have redness or swelling of your back. You have a burning feeling when you pee. Summary Sciatica is  pain, weakness, tingling, or loss of feeling (numbness) along the sciatic nerve. This may include the lower back, legs, hips, and butt. This condition happens when the sciatic nerve is pinched or has pressure put on it. Treatment often includes rest, exercise, medicines, and putting ice or heat on the affected area. This information is not intended to replace advice given to you by your health care provider. Make sure you discuss any questions you have with your health care provider. Document Revised: 06/03/2021 Document Reviewed: 06/03/2021 Elsevier Patient Education  2024 [employer].

## 2022-11-07 ENCOUNTER — Encounter: Payer: Self-pay | Admitting: Nurse Practitioner

## 2022-11-07 ENCOUNTER — Ambulatory Visit: Payer: 59 | Admitting: Nurse Practitioner

## 2022-11-07 VITALS — BP 158/96 | HR 73 | Temp 98.0°F | Ht 62.2 in | Wt 207.2 lb

## 2022-11-07 DIAGNOSIS — F5101 Primary insomnia: Secondary | ICD-10-CM

## 2022-11-07 DIAGNOSIS — E1169 Type 2 diabetes mellitus with other specified complication: Secondary | ICD-10-CM

## 2022-11-07 DIAGNOSIS — F419 Anxiety disorder, unspecified: Secondary | ICD-10-CM

## 2022-11-07 DIAGNOSIS — Z7984 Long term (current) use of oral hypoglycemic drugs: Secondary | ICD-10-CM

## 2022-11-07 DIAGNOSIS — F330 Major depressive disorder, recurrent, mild: Secondary | ICD-10-CM | POA: Diagnosis not present

## 2022-11-07 DIAGNOSIS — E669 Obesity, unspecified: Secondary | ICD-10-CM

## 2022-11-07 DIAGNOSIS — E785 Hyperlipidemia, unspecified: Secondary | ICD-10-CM

## 2022-11-07 DIAGNOSIS — I152 Hypertension secondary to endocrine disorders: Secondary | ICD-10-CM | POA: Diagnosis not present

## 2022-11-07 DIAGNOSIS — M545 Low back pain, unspecified: Secondary | ICD-10-CM | POA: Diagnosis not present

## 2022-11-07 DIAGNOSIS — E559 Vitamin D deficiency, unspecified: Secondary | ICD-10-CM | POA: Diagnosis not present

## 2022-11-07 DIAGNOSIS — E1159 Type 2 diabetes mellitus with other circulatory complications: Secondary | ICD-10-CM

## 2022-11-07 DIAGNOSIS — M5431 Sciatica, right side: Secondary | ICD-10-CM | POA: Insufficient documentation

## 2022-11-07 DIAGNOSIS — Z1211 Encounter for screening for malignant neoplasm of colon: Secondary | ICD-10-CM | POA: Diagnosis not present

## 2022-11-07 LAB — URINALYSIS, ROUTINE W REFLEX MICROSCOPIC
Bilirubin, UA: NEGATIVE
Glucose, UA: NEGATIVE
Ketones, UA: NEGATIVE
Leukocytes,UA: NEGATIVE
Nitrite, UA: NEGATIVE
Protein,UA: NEGATIVE
RBC, UA: NEGATIVE
Specific Gravity, UA: >1.03 — ABNORMAL HIGH (ref 1.005–1.030)
Urobilinogen, Ur: 0.2 mg/dL (ref 0.2–1.0)
pH, UA: 6 (ref 5.0–7.5)

## 2022-11-07 LAB — BAYER DCA HB A1C WAIVED: HB A1C (BAYER DCA - WAIVED): 7.6 % — ABNORMAL HIGH (ref 4.8–5.6)

## 2022-11-07 MED ORDER — METHYLPREDNISOLONE 4 MG PO TBPK: ORAL_TABLET | ORAL | 0 refills | Status: DC

## 2022-11-07 MED ORDER — SEMAGLUTIDE(0.25 OR 0.5MG/DOS) 2 MG/3ML ~~LOC~~ SOPN
PEN_INJECTOR | SUBCUTANEOUS | 0 refills | Status: DC
Start: 2022-11-07 — End: 2022-11-21

## 2022-11-07 MED ORDER — CYCLOBENZAPRINE HCL 10 MG PO TABS: 10.0000 mg | ORAL_TABLET | Freq: Three times a day (TID) | ORAL | 0 refills | Status: AC | PRN

## 2022-11-07 MED ORDER — GABAPENTIN 100 MG PO CAPS: 100.0000 mg | ORAL_CAPSULE | Freq: Every day | ORAL | 3 refills | Status: DC

## 2022-11-07 NOTE — Assessment & Plan Note (Signed)
Refer to depression plan of care. 

## 2022-11-07 NOTE — Assessment & Plan Note (Signed)
Chronic, ongoing.  A1c 7.6% upward trend from previous as has no Ozempic (insurance would not cover), was 6.4% last visit.  Urine ALB 80 March 2024, remain with ARB on board for kidney protection.  We will attempt to restart Ozempic and stop Glipizide which would be more appropriate for long term diabetes health, Holly Norman prefers not to take Trulicity due to pain with this.  Continue Metformin and recommend Holly Norman take as ordered.  Recommend Holly Norman check fasting sugars daily and document for visits.  Return in 3 months. - Eye and foot exam up to date - On ARB and statin therapy - wishes to think about pneumococcal vaccine

## 2022-11-07 NOTE — Assessment & Plan Note (Signed)
Chronic, stable per patient.  Suspect may be more hormone driven, has ovaries still in place.  Denies SI/HI.  Continue Celexa 20 MG daily and Buspar 10 MG daily in morning (does not do well with BID dosing).  Educated her on medications.  Recommend meditation and relaxation techniques.  At this time she reports stable, baseline mood and scores have improved a little.

## 2022-11-07 NOTE — Assessment & Plan Note (Signed)
Chronic, ongoing.  BP remains is above goal today, has taken lots of Advil lately due to back pain.  Will continue Losartan 100 MG and continue Atenolol at current dosing.  Discussed at length with patient.  Recommend she monitor BP at least a few mornings a week at home and document.  DASH diet at home.  Continue current medication regimen and adjust as needed. Recommend she cut back to very minimal use of Ibuprofen products and educated her on this.  Labs today: CMP.  Consider Amlodipine if ongoing elevations next visit.  Urine ALB 80 March 2024, continue ARB for protection.

## 2022-11-07 NOTE — Progress Notes (Signed)
Contacted via MyChart    Your urine shows no infection, suspect this is more muscle or pinched nerve.

## 2022-11-07 NOTE — Assessment & Plan Note (Signed)
Chronic with underlying acute worsening at present.  UA negative.  Will cautiously use steroid pack, discussed this with patient at length and she is aware to monitor sugars closely -- to stop if sugars >300.  Flexeril to take as needed for pain, recommend not to take while driving or working.  May take at night for pain and this may help her sleep.  Educated her at length on plan.  She is to return if ongoing or worsening pain and then we will pursue imaging and possibly PT, which PCP discussed with her.

## 2022-11-07 NOTE — Assessment & Plan Note (Signed)
Ongoing issue.  ?hormone driven at this time and increased anxiety present. Will continue Gabapentin 100 MG at night as needed for sleep and mood.  Educated her on this and side effects.  May use supplements as currently doing.

## 2022-11-07 NOTE — Assessment & Plan Note (Signed)
Level was 8 in April 2024, recommend she continue weekly dosing.  Check level today.

## 2022-11-07 NOTE — Assessment & Plan Note (Signed)
Chronic, ongoing.  Continue current medication regimen and adjust as needed. Lipid panel today. 

## 2022-11-07 NOTE — Progress Notes (Signed)
BP (!) 158/96 (BP Location: Left Arm, Patient Position: Sitting, Cuff Size: Large) Comment: has been taking Advil for back pain  Pulse 73   Temp 98 F (36.7 C) (Oral)   Ht 5' 2.2" (1.58 m)   Wt 207 lb 3.2 oz (94 kg)   LMP  (LMP Unknown)   SpO2 98%   BMI 37.65 kg/m    Subjective:    Patient ID: Holly Norman, female    DOB: 05/05/1970, 52 y.o.   MRN: [id number]  HPI: [name] is a 52 y.o. female  Chief Complaint  Patient presents with   Depression   Diabetes    Eye exam requested from Dr. Clearance Coots in [facility]   Hyperlipidemia   Hypertension   DIABETES A1c 6.4% in March  Taking Metformin 1000 MG BID only. Is to be taking Glipizide, but forgot to pick up.  Was on Ozempic in past, which she preferred -- we tried to get approved and they denied prescription.  Reports Trulicity was painful to inject.   Hypoglycemic episodes:no Polydipsia/polyuria: no Visual disturbance: no Chest pain: no Paresthesias: no Glucose Monitoring: yes  Accucheck frequency: Not Checking  Fasting glucose:  Post prandial:  Evening:  Before meals: Taking Insulin?: no  Long acting insulin:  Short acting insulin: Blood Pressure Monitoring: occasionally Retinal Examination: Up to Date -- Dr. Judie Bonus at [facility] Foot Exam: Up to Date Pneumovax: Not up to Date Influenza: refuses Aspirin: no   HYPERTENSION / HYPERLIPIDEMIA Taking Atenolol, Losartan, + Rosuvastati and Zetia + Fenofibrate. Has taken a lot of Ibuprofen lately due to back pain. Satisfied with current treatment? yes Duration of hypertension: chronic BP monitoring frequency: not checking BP range:  BP medication side effects: no Duration of hyperlipidemia: chronic Cholesterol medication side effects: no Cholesterol supplements: none Medication compliance: good compliance Aspirin: no Recent stressors: no Recurrent headaches: no Visual changes: no Palpitations: no Dyspnea: no Chest pain: no Lower extremity edema:  no Dizzy/lightheaded: no   BACK PAIN Has been present all year to right side, but has gotten worse recently.  Taking Tylenol and Advil.  Cleans houses for a living. Duration: months Mechanism of injury: no trauma Location: Right and low back Onset: gradual Severity: 10/10 worst and 3-4 at best Quality: sharp, aching, and throbbing Frequency: constant but worst pain is intermittent Radiation: none Aggravating factors: movement, walking, and bending Alleviating factors: NSAIDs and APAP Status: worse Treatments attempted: APAP and Ibuprofen  Relief with NSAIDs?: moderate Nighttime pain:  no Paresthesias / decreased sensation:  no Bowel / bladder incontinence:  no Fevers:  no Dysuria / urinary frequency:  no   DEPRESSION Continues on Celexa 20 MG daily and Buspar 10 MG BID. Taking Vitamin D supplement due to history of low level. Mood status: stable Satisfied with current treatment?: yes Symptom severity: moderate  Duration of current treatment : chronic Side effects: no Medication compliance: good compliance Psychotherapy/counseling: yes in past Previous psychiatric medications: Celexa Depressed mood: yes manageable Anxious mood: yes manageable Anhedonia: no Significant weight loss or gain: no Insomnia: yes hard to stay asleep -- taking Delta 9 gummies they are helping - tried Gabapentin with some benefit Fatigue: occasionally Feelings of worthlessness or guilt: no Impaired concentration/indecisiveness: yes Suicidal ideations: no Hopelessness: no Crying spells: no    11/07/2022    1:30 PM 05/21/2022   10:19 AM 04/24/2022   11:15 AM 07/25/2021    4:29 PM 07/18/2021    4:32 PM  Depression screen PHQ 2/9  Decreased  Interest 0 0 0 0 0  Down, Depressed, Hopeless 0 0 0 0 0  PHQ - 2 Score 0 0 0 0 0  Altered sleeping 3 3 3 3 1   Tired, decreased energy 0 1 1 1 1   Change in appetite 0 0 0 0 0  Feeling bad or failure about yourself  0 0 0 0 0  Trouble concentrating 3 3 3 3 3    Moving slowly or fidgety/restless 0 0 0 2 0  Suicidal thoughts 0 0 0 0 0  PHQ-9 Score 6 7 7 9 5   Difficult doing work/chores Not difficult at all Somewhat difficult  Very difficult        11/07/2022    1:30 PM 05/21/2022   10:19 AM 04/24/2022   11:15 AM 07/25/2021    4:29 PM  GAD 7 : Generalized Anxiety Score  Nervous, Anxious, on Edge 1 3 3 3   Control/stop worrying 2 2 3 1   Worry too much - different things 2 2 3 1   Trouble relaxing 3 3 3 3   Restless 3 3 3 3   Easily annoyed or irritable 2 3 3 3   Afraid - awful might happen 0 0 0 0  Total GAD 7 Score 13 16 18 14   Anxiety Difficulty Not difficult at all Somewhat difficult  Very difficult   Relevant past medical, surgical, family and social history reviewed and updated as indicated. Interim medical history since our last visit reviewed. Allergies and medications reviewed and updated.  Review of Systems  Constitutional:  Negative for activity change, appetite change, diaphoresis, fatigue and fever.  Respiratory:  Negative for cough, chest tightness and shortness of breath.   Cardiovascular:  Negative for chest pain, palpitations and leg swelling.  Gastrointestinal: Negative.   Endocrine: Negative for polydipsia, polyphagia and polyuria.  Musculoskeletal:  Positive for back pain.  Neurological: Negative.   Psychiatric/Behavioral: Negative.     Per HPI unless specifically indicated above     Objective:    BP (!) 158/96 (BP Location: Left Arm, Patient Position: Sitting, Cuff Size: Large) Comment: has been taking Advil for back pain  Pulse 73   Temp 98 F (36.7 C) (Oral)   Ht 5' 2.2" (1.58 m)   Wt 207 lb 3.2 oz (94 kg)   LMP  (LMP Unknown)   SpO2 98%   BMI 37.65 kg/m   Wt Readings from Last 3 Encounters:  11/07/22 207 lb 3.2 oz (94 kg)  05/21/22 204 lb 4.8 oz (92.7 kg)  04/24/22 204 lb 4.8 oz (92.7 kg)    Physical Exam Vitals and nursing note reviewed.  Constitutional:      General: She is awake. She is not in acute  distress.    Appearance: She is well-developed and well-groomed. She is obese. She is not ill-appearing or toxic-appearing.  HENT:     Head: Normocephalic.     Right Ear: Hearing and external ear normal.     Left Ear: Hearing and external ear normal.  Eyes:     General: Lids are normal.        Right eye: No discharge.        Left eye: No discharge.     Conjunctiva/sclera: Conjunctivae normal.     Pupils: Pupils are equal, round, and reactive to light.  Neck:     Thyroid: No thyromegaly.     Vascular: No carotid bruit.  Cardiovascular:     Rate and Rhythm: Normal rate and regular rhythm.  Heart sounds: Normal heart sounds. No murmur heard.    No gallop.  Pulmonary:     Effort: Pulmonary effort is normal. No accessory muscle usage or respiratory distress.     Breath sounds: Normal breath sounds.  Abdominal:     General: Bowel sounds are normal. There is no distension.     Palpations: Abdomen is soft.     Tenderness: There is no abdominal tenderness.  Musculoskeletal:     Cervical back: Normal range of motion and neck supple.     Lumbar back: Tenderness (to right glueus and SI area) present. No swelling or spasms. Decreased range of motion. Positive right straight leg raise test. Negative left straight leg raise test.     Right lower leg: No edema.     Left lower leg: No edema.     Comments: Decreased flexion and extension, with tenderness.  No discomfort lateral movement, but discomfort with rotation to right.  Lymphadenopathy:     Cervical: No cervical adenopathy.  Skin:    General: Skin is warm and dry.  Neurological:     Mental Status: She is alert and oriented to person, place, and time.     Deep Tendon Reflexes: Reflexes are normal and symmetric.     Reflex Scores:      Brachioradialis reflexes are 2+ on the right side and 2+ on the left side.      Patellar reflexes are 2+ on the right side and 2+ on the left side. Psychiatric:        Attention and Perception:  Attention normal.        Mood and Affect: Mood normal.        Speech: Speech normal.        Behavior: Behavior normal. Behavior is cooperative.        Thought Content: Thought content normal.    Results for orders placed or performed in visit on 11/07/22  Bayer DCA Hb A1c Waived  Result Value Ref Range   HB A1C (BAYER DCA - WAIVED) 7.6 (H) 4.8 - 5.6 %  Urinalysis, Routine w reflex microscopic  Result Value Ref Range   Specific Gravity, UA >1.030 (H) 1.005 - 1.030   pH, UA 6.0 5.0 - 7.5   Color, UA Yellow Yellow   Appearance Ur Cloudy (A) Clear   Leukocytes,UA Negative Negative   Protein,UA Negative Negative/Trace   Glucose, UA Negative Negative   Ketones, UA Negative Negative   RBC, UA Negative Negative   Bilirubin, UA Negative Negative   Urobilinogen, Ur 0.2 0.2 - 1.0 mg/dL   Nitrite, UA Negative Negative   Microscopic Examination Comment       Assessment & Plan:   Problem List Items Addressed This Visit       Cardiovascular and Mediastinum   Hypertension associated with diabetes (HCC)    Chronic, ongoing.  BP remains is above goal today, has taken lots of Advil lately due to back pain.  Will continue Losartan 100 MG and continue Atenolol at current dosing.  Discussed at length with patient.  Recommend she monitor BP at least a few mornings a week at home and document.  DASH diet at home.  Continue current medication regimen and adjust as needed. Recommend she cut back to very minimal use of Ibuprofen products and educated her on this.  Labs today: CMP.  Consider Amlodipine if ongoing elevations next visit.  Urine ALB 80 March 2024, continue ARB for protection.  Relevant Medications   Semaglutide,0.25 or 0.5MG /DOS, 2 MG/3ML SOPN   Other Relevant Orders   Bayer DCA Hb A1c Waived (Completed)   Comprehensive metabolic panel     Endocrine   Hyperlipidemia associated with type 2 diabetes mellitus (HCC)    Chronic, ongoing.  Continue current medication regimen and  adjust as needed.  Lipid panel today.      Relevant Medications   Semaglutide,0.25 or 0.5MG /DOS, 2 MG/3ML SOPN   Other Relevant Orders   Bayer DCA Hb A1c Waived (Completed)   Comprehensive metabolic panel   Lipid Panel w/o Chol/HDL Ratio   Type 2 diabetes mellitus with obesity (HCC) - Primary    Chronic, ongoing.  A1c 7.6% upward trend from previous as has no Ozempic (insurance would not cover), was 6.4% last visit.  Urine ALB 80 March 2024, remain with ARB on board for kidney protection.  We will attempt to restart Ozempic and stop Glipizide which would be more appropriate for long term diabetes health, she prefers not to take Trulicity due to pain with this.  Continue Metformin and recommend she take as ordered.  Recommend she check fasting sugars daily and document for visits.  Return in 3 months. - Eye and foot exam up to date - On ARB and statin therapy - wishes to think about pneumococcal vaccine      Relevant Medications   Semaglutide,0.25 or 0.5MG /DOS, 2 MG/3ML SOPN   Other Relevant Orders   Bayer DCA Hb A1c Waived (Completed)     Nervous and Auditory   Sciatic pain, right    Chronic with underlying acute worsening at present.  UA negative.  Will cautiously use steroid pack, discussed this with patient at length and she is aware to monitor sugars closely -- to stop if sugars >300.  Flexeril to take as needed for pain, recommend not to take while driving or working.  May take at night for pain and this may help her sleep.  Educated her at length on plan.  She is to return if ongoing or worsening pain and then we will pursue imaging and possibly PT, which PCP discussed with her.      Relevant Medications   cyclobenzaprine (FLEXERIL) 10 MG tablet   gabapentin (NEURONTIN) 100 MG capsule   Other Relevant Orders   Urinalysis, Routine w reflex microscopic (Completed)     Other   Anxiety    Refer to depression plan of care.      Depression    Chronic, stable per patient.   Suspect may be more hormone driven, has ovaries still in place.  Denies SI/HI.  Continue Celexa 20 MG daily and Buspar 10 MG daily in morning (does not do well with BID dosing).  Educated her on medications.  Recommend meditation and relaxation techniques.  At this time she reports stable, baseline mood and scores have improved a little.        Insomnia    Ongoing issue.  ?hormone driven at this time and increased anxiety present. Will continue Gabapentin 100 MG at night as needed for sleep and mood.  Educated her on this and side effects.  May use supplements as currently doing.      Vitamin D deficiency    Level was 8 in April 2024, recommend she continue weekly dosing.  Check level today.      Relevant Orders   VITAMIN D 25 Hydroxy (Vit-D Deficiency, Fractures)   Other Visit Diagnoses     Colon cancer screening  GI referral placed.   Relevant Orders   Ambulatory referral to Gastroenterology        Follow up plan: Return in about 3 months (around 02/07/2023) for T2DM, HTN/HLD, MOOD.

## 2022-11-08 LAB — LIPID PANEL W/O CHOL/HDL RATIO
Cholesterol, Total: 267 mg/dL — ABNORMAL HIGH (ref 100–199)
HDL: 41 mg/dL (ref >39)
LDL Chol Calc (NIH): 171 mg/dL — ABNORMAL HIGH (ref 0–99)
Triglycerides: 290 mg/dL — ABNORMAL HIGH (ref 0–149)
VLDL Cholesterol Cal: 55 mg/dL — ABNORMAL HIGH (ref 5–40)

## 2022-11-08 LAB — COMPREHENSIVE METABOLIC PANEL
ALT: 18 IU/L (ref 0–32)
AST: 15 IU/L (ref 0–40)
Albumin: 4.3 g/dL (ref 3.8–4.9)
Alkaline Phosphatase: 74 IU/L (ref 44–121)
BUN/Creatinine Ratio: 20 (ref 9–23)
BUN: 17 mg/dL (ref 6–24)
Bilirubin Total: 0.9 mg/dL (ref 0.0–1.2)
CO2: 22 mmol/L (ref 20–29)
Calcium: 10.1 mg/dL (ref 8.7–10.2)
Chloride: 102 mmol/L (ref 96–106)
Creatinine, Ser: 0.86 mg/dL (ref 0.57–1.00)
Globulin, Total: 2.9 g/dL (ref 1.5–4.5)
Glucose: 129 mg/dL — ABNORMAL HIGH (ref 70–99)
Potassium: 4.5 mmol/L (ref 3.5–5.2)
Sodium: 142 mmol/L (ref 134–144)
Total Protein: 7.2 g/dL (ref 6.0–8.5)
eGFR: 81 mL/min/{1.73_m2} (ref >59)

## 2022-11-08 LAB — VITAMIN D 25 HYDROXY (VIT D DEFICIENCY, FRACTURES): Vit D, 25-Hydroxy: 35.7 ng/mL (ref 30.0–100.0)

## 2022-11-09 ENCOUNTER — Encounter: Payer: Self-pay | Admitting: Nurse Practitioner

## 2022-11-09 NOTE — Progress Notes (Signed)
Contacted via MyChart   Good morning Holly Norman, your labs have returned: - Kidney function, creatinine and eGFR, remains normal, as is liver function, AST and ALT.  - Vitamin D level normal. - Cholesterol levels are remaining elevated -- are you taking your Rosuvastatin and Zetia every day?  Please let me know as we may need to adjust regimen to get these levels under better control.  Any questions? Keep being amazing!!  Thank you for allowing me to participate in your care.  I appreciate you. Kindest regards, Lisamarie Coke

## 2022-11-11 ENCOUNTER — Telehealth: Payer: Self-pay

## 2022-11-11 ENCOUNTER — Encounter: Payer: Self-pay | Admitting: Nurse Practitioner

## 2022-11-11 NOTE — Telephone Encounter (Signed)
PA for Ozempic initiated and submitted via Cover My Meds. Key: [id number]

## 2022-11-13 ENCOUNTER — Other Ambulatory Visit: Payer: Self-pay

## 2022-11-13 ENCOUNTER — Telehealth: Payer: Self-pay

## 2022-11-13 DIAGNOSIS — Z1211 Encounter for screening for malignant neoplasm of colon: Secondary | ICD-10-CM

## 2022-11-13 DIAGNOSIS — Z800 Family history of malignant neoplasm of digestive organs: Secondary | ICD-10-CM

## 2022-11-13 MED ORDER — NA SULFATE-K SULFATE-MG SULF 17.5-3.13-1.6 GM/177ML PO SOLN: 1.0000 | Freq: Once | ORAL | 0 refills | Status: AC

## 2022-11-13 NOTE — Telephone Encounter (Signed)
Gastroenterology Pre-Procedure Review  Request Date: 12/17/22 Requesting Physician: Dr. Tobi Bastos  PATIENT REVIEW QUESTIONS: The patient responded to the following health history questions as indicated:    1. Are you having any GI issues? no 2. Do you have a personal history of Polyps? no 3. Do you have a family history of Colon Cancer or Polyps? yes (maternal and paternal grandmothers had colon cancer, mother colon polyps) 4. Diabetes Mellitus? yes (currently taking Metformin and Glipizide awaiting Ozempic to be approved) 5. Joint replacements in the past 12 months?no 6. Major health problems in the past 3 months?no 7. Any artificial heart valves, MVP, or defibrillator?no    MEDICATIONS & ALLERGIES:    Patient reports the following regarding taking any anticoagulation/antiplatelet therapy:   Plavix, Coumadin, Eliquis, Xarelto, Lovenox, Pradaxa, Brilinta, or Effient? no Aspirin? no  Patient confirms/reports the following medications:  Current Outpatient Medications  Medication Sig Dispense Refill   Na Sulfate-K Sulfate-Mg Sulf 17.5-3.13-1.6 GM/177ML SOLN Take 1 kit by mouth once for 1 dose. 354 mL 0   albuterol (VENTOLIN HFA) 108 (90 Base) MCG/ACT inhaler Inhale 2 puffs into the lungs every 6 (six) hours as needed for wheezing or shortness of breath. 8 g 0   atenolol (TENORMIN) 50 MG tablet Take 1 tablet (50 mg total) by mouth daily. 90 tablet 4   busPIRone (BUSPAR) 10 MG tablet Take 1 tablet (10 mg total) by mouth 2 (two) times daily. 30 tablet 4   Cholecalciferol 1.25 MG (50000 UT) TABS Take 1 tablet by mouth once a week. 12 tablet 4   citalopram (CELEXA) 20 MG tablet Take 1 tablet (20 mg total) by mouth daily. 30 tablet 4   cyanocobalamin (VITAMIN B12) 1000 MCG tablet Take 1 tablet (1,000 mcg total) by mouth daily. 90 tablet 4   cyclobenzaprine (FLEXERIL) 10 MG tablet Take 1 tablet (10 mg total) by mouth 3 (three) times daily as needed for muscle spasms. 60 tablet 0   ezetimibe (ZETIA)  10 MG tablet Take 1 tablet (10 mg total) by mouth daily. 90 tablet 4   fenofibrate (TRICOR) 145 MG tablet Take 1 tablet (145 mg total) by mouth daily. 90 tablet 4   gabapentin (NEURONTIN) 100 MG capsule Take 1 capsule (100 mg total) by mouth at bedtime. 90 capsule 3   glipiZIDE (GLUCOTROL) 5 MG tablet Take 1 tablet (5 mg total) by mouth 2 (two) times daily before a meal. (Patient not taking: Reported on 11/07/2022) 180 tablet 4   losartan (COZAAR) 100 MG tablet Take 1 tablet (100 mg total) by mouth daily. 90 tablet 4   meclizine (ANTIVERT) 25 MG tablet Take 1 tablet (25 mg total) by mouth 3 (three) times daily as needed for dizziness. 30 tablet 1   metFORMIN (GLUCOPHAGE) 1000 MG tablet Take 1 tablet (1,000 mg total) by mouth 2 (two) times daily with a meal. 180 tablet 4   methylPREDNISolone (MEDROL DOSEPAK) 4 MG TBPK tablet Take as instructed on pack. 21 each 0   Omega-3 Fatty Acids (FISH OIL) 1000 MG CAPS Take 1,000 mg by mouth daily.     rosuvastatin (CRESTOR) 40 MG tablet Take 1 tablet (40 mg total) by mouth daily. 90 tablet 4   Semaglutide,0.25 or 0.5MG /DOS, 2 MG/3ML SOPN Inject 0.25 MG subcutaneous into skin weekly for 4 weeks and then on week 5 start injecting 0.5 MG weekly into the skin. (Patient not taking: Reported on 11/13/2022) 9 mL 0   No current facility-administered medications for this visit.  Patient confirms/reports the following allergies:  Allergies  Allergen Reactions   Ivp Dye [Iodinated Contrast Media] Shortness Of Breath   Tape Rash    No orders of the defined types were placed in this encounter.   AUTHORIZATION INFORMATION Primary Insurance: 1D#: Group #:  Secondary Insurance: 1D#: Group #:  SCHEDULE INFORMATION: Date: 12/17/22 Time: Location: [facility]

## 2022-11-21 ENCOUNTER — Encounter: Payer: Self-pay | Admitting: Nurse Practitioner

## 2022-11-21 ENCOUNTER — Other Ambulatory Visit: Payer: Self-pay | Admitting: Nurse Practitioner

## 2022-11-21 MED ORDER — TRULICITY 0.75 MG/0.5ML ~~LOC~~ SOAJ: 0.7500 mg | SUBCUTANEOUS | 0 refills | Status: DC

## 2022-11-24 ENCOUNTER — Telehealth: Payer: Self-pay

## 2022-11-24 NOTE — Telephone Encounter (Signed)
Preferred alternatives are Darien Ramus, Jardiance, or Lantus.

## 2022-11-24 NOTE — Telephone Encounter (Signed)
PA for Trulicity initiated and submitted via Cover My Meds. Key: [id number]

## 2022-11-28 ENCOUNTER — Encounter: Payer: Self-pay | Admitting: Nurse Practitioner

## 2022-11-28 DIAGNOSIS — E1169 Type 2 diabetes mellitus with other specified complication: Secondary | ICD-10-CM

## 2022-11-28 DIAGNOSIS — I152 Hypertension secondary to endocrine disorders: Secondary | ICD-10-CM

## 2022-11-28 NOTE — Telephone Encounter (Signed)
PA approved. Patient notified of approval.

## 2022-12-01 ENCOUNTER — Telehealth: Payer: Self-pay

## 2022-12-01 NOTE — Progress Notes (Signed)
Care Guide Note  12/01/2022 Name: ROBERTTA FINNELL MRN: [id number] DOB: 1970/06/04  Referred by: Marjie Skiff, NP Reason for referral : Care Coordination (Outreach to schedule with Pharm d )   VANDY REUTHER is a 52 y.o. year old female who is a primary care patient of Cannady, Dorie Rank, NP. AINZLEE DARWISH was referred to the pharmacist for assistance related to DM.    An unsuccessful telephone outreach was attempted today to contact the patient who was referred to the pharmacy team for assistance with medication assistance. Additional attempts will be made to contact the patient.   Penne Lash, RMA Care Guide [facility]  [place], [place] [address] Direct Dial: [phone] [email or web address]

## 2022-12-04 NOTE — Progress Notes (Signed)
Care Guide Note  12/04/2022 Name: Holly Norman MRN: [id number] DOB: Aug 29, 1970  Referred by: Marjie Skiff, NP Reason for referral : Care Coordination (Outreach to schedule with Pharm d )   Holly Norman is a 52 y.o. year old female who is a primary care patient of Cannady, Dorie Rank, NP. Holly Norman was referred to the pharmacist for assistance related to DM.    A second unsuccessful telephone outreach was attempted today to contact the patient who was referred to the pharmacy team for assistance with medication assistance. Additional attempts will be made to contact the patient.  Penne Lash, RMA Care Guide [facility]  [place], [place] [address] Direct Dial: [phone] [email or web address]

## 2022-12-10 ENCOUNTER — Ambulatory Visit: Payer: Self-pay | Admitting: *Deleted

## 2022-12-10 NOTE — Telephone Encounter (Signed)
Summary: Swelling under eye   Under eye swelling, tear ducts blocked. Pt is concerned  Best contact: [phone]     Reason for Disposition  Eyelid is red and painful (or tender to touch)  Answer Assessment - Initial Assessment Questions 1. ONSET: "When did the swelling start?" (e.g., minutes, hours, days)     2 months- off/on 2. LOCATION: "What part of the eyelids is swollen?"     Left 3. SEVERITY: "How swollen is it?"     Knot near corner of eye-quarter size pocket under eye 4. ITCHING: "Is there any itching?" If Yes, ask: "How much?"   (Scale 1-10; mild, moderate or severe)     Yes- moderate 5. PAIN: "Is the swelling painful to touch?" If Yes, ask: "How painful is it?"   (Scale 1-10; mild, moderate or severe)     Sore to touch 6. FEVER: "Do you have a fever?" If Yes, ask: "What is it, how was it measured, and when did it start?"      no 7. CAUSE: "What do you think is causing the swelling?"     Tear duct clogged- possible 8. RECURRENT SYMPTOM: "Have you had eyelid swelling before?" If Yes, ask: "When was the last time?" "What happened that time?"     no 9. OTHER SYMPTOMS: "Do you have any other symptoms?" (e.g., blurred vision, eye discharge, rash, runny nose)     Runny-clear  Protocols used: Eye - Swelling-A-AH

## 2022-12-10 NOTE — Telephone Encounter (Signed)
  Chief Complaint: Left eye swelling/knot inner corner Symptoms: itching, increased tearing-clear, knot inner corner- not puffy under the eye Frequency: 2 months- off/on Pertinent Negatives: Patient denies pain- although now tender to touch Disposition: [] ED /[] Urgent Care (no appt availability in office) / [x] Appointment(In office/virtual)/ []  [place] [facility]/ [] Home Care/ [] Refused Recommended Disposition /[] [place] [facility]/ []  Follow-up with PCP Additional Notes: Patient states she has a knot inner corner of left eye- using warm compress without relief. Appointment made for evaluation- patient to call office if symptoms get worse before appointment

## 2022-12-11 ENCOUNTER — Ambulatory Visit: Payer: 59 | Admitting: Nurse Practitioner

## 2022-12-11 ENCOUNTER — Encounter: Payer: Self-pay | Admitting: Nurse Practitioner

## 2022-12-11 VITALS — BP 126/78 | HR 71 | Ht 62.0 in | Wt 206.4 lb

## 2022-12-11 DIAGNOSIS — L03213 Periorbital cellulitis: Secondary | ICD-10-CM | POA: Diagnosis not present

## 2022-12-11 DIAGNOSIS — H00015 Hordeolum externum left lower eyelid: Secondary | ICD-10-CM | POA: Diagnosis not present

## 2022-12-11 MED ORDER — CEFPODOXIME PROXETIL 100 MG PO TABS: 100.0000 mg | ORAL_TABLET | Freq: Two times a day (BID) | ORAL | 0 refills | Status: DC

## 2022-12-11 MED ORDER — ERYTHROMYCIN 5 MG/GM OP OINT: 1.0000 | TOPICAL_OINTMENT | Freq: Every day | OPHTHALMIC | 0 refills | Status: AC

## 2022-12-11 MED ORDER — TRULICITY 0.75 MG/0.5ML ~~LOC~~ SOAJ: 0.7500 mg | SUBCUTANEOUS | 1 refills | Status: DC

## 2022-12-11 MED ORDER — CEFPODOXIME PROXETIL 200 MG PO TABS: 400.0000 mg | ORAL_TABLET | Freq: Two times a day (BID) | ORAL | 0 refills | Status: AC

## 2022-12-11 MED ORDER — PREDNISONE 20 MG PO TABS: 40.0000 mg | ORAL_TABLET | Freq: Every day | ORAL | 0 refills | Status: AC

## 2022-12-11 MED ORDER — CEFTRIAXONE SODIUM 500 MG IJ SOLR
500.0000 mg | Freq: Once | INTRAMUSCULAR | Status: AC
Start: 2022-12-11 — End: 2022-12-11
  Administered 2022-12-11: 500 mg via INTRAMUSCULAR

## 2022-12-11 NOTE — Assessment & Plan Note (Signed)
Acute, started two months ago but has worsened.  Has stye to inner lower lid with white tip present, suspect main cause of infection.  Discussed at length treatment options, she prefers not to go to ER and can not get in to see her eye doctor for one month.  Will start outpatient treatment with close monitoring: Rocephin 500 MG IM in office, Cepodoxime 400 MG Q12H for 7 days, Erythromycin ointment to place in eye, and Prednisone 40 MG for 5 days.  She is aware if any worsening to immediately go to ER as may need IV treatment.  Return in one week.

## 2022-12-11 NOTE — Assessment & Plan Note (Signed)
Refer to periorbital cellulitis.  Is to place warm compresses to eye every 4 hours while awake.

## 2022-12-11 NOTE — Patient Instructions (Signed)
Stye A stye, also known as a hordeolum, is a bump that forms on an eyelid. It may look like a pimple next to the eyelash. A stye can form inside the eyelid (internal stye) or outside the eyelid (external stye). A stye can cause redness, swelling, and pain on the eyelid. Styes are very common. Anyone can get them at any age. They usually occur in just one eye at a time, but you may have more than one in either eye. What are the causes? A stye is caused by an infection. The infection is almost always caused by bacteria called Staphylococcus aureus. This is a common type of bacteria that lives on the skin. An internal stye may result from an infected oil-producing gland inside the eyelid. An external stye may be caused by an infection at the base of the eyelash (hair follicle). What increases the risk? You are more likely to develop a stye if: You have had a stye before. You have any of these conditions: Red, itchy, inflamed eyelids (blepharitis). A skin condition such as seborrheic dermatitis or rosacea. High fat levels in your blood (lipids). Dry eyes. What are the signs or symptoms? The most common symptom of a stye is eyelid pain. Internal styes are more painful than external styes. Other symptoms may include: Painful swelling of your eyelid. A scratchy feeling in your eye. Tearing and redness of your eye. A pimple-like bump on the edge of the eyelid. Pus draining from the stye. How is this diagnosed? Your health care provider may be able to diagnose a stye just by examining your eye. The health care provider may also check to make sure: You do not have a fever or other signs of a more serious infection. The infection has not spread to other parts of your eye or areas around your eye. How is this treated? Most styes will clear up in a few days without treatment or with warm compresses applied to the area. You may need to use antibiotic drops or ointment to treat an infection. Sometimes,  steroid drops or ointment are used in addition to antibiotics. In some cases, your health care provider may give you a small steroid injection in the eyelid. If your stye does not heal with routine treatment, your health care provider may drain pus from the stye using a thin blade or needle. This may be done if the stye is large, causing a lot of pain, or affecting your vision. Follow these instructions at home: Take over-the-counter and prescription medicines only as told by your health care provider. This includes eye drops or ointments. If you were prescribed an antibiotic medicine, steroid medicine, or both, apply or use them as told by your health care provider. Do not stop using the medicine even if your condition improves. Apply a warm, wet cloth (warm compress) to your eye for 5-10 minutes, 4 to 6 times a day. Clean the affected eyelid as directed by your health care provider. Do not wear contact lenses or eye makeup until your stye has healed and your health care provider says that it is safe. Do not try to pop or drain the stye. Do not rub your eye. Contact a health care provider if: You have chills or a fever. Your stye does not go away after several days. Your stye affects your vision. Your eyeball becomes swollen, red, or painful. Get help right away if: You have pain when moving your eye around. Summary A stye is a bump that forms   on an eyelid. It may look like a pimple next to the eyelash. A stye can form inside the eyelid (internal stye) or outside the eyelid (external stye). A stye can cause redness, swelling, and pain on the eyelid. Your health care provider may be able to diagnose a stye just by examining your eye. Apply a warm, wet cloth (warm compress) to your eye for 5-10 minutes, 4 to 6 times a day. This information is not intended to replace advice given to you by your health care provider. Make sure you discuss any questions you have with your health care  provider. Document Revised: 05/02/2020 Document Reviewed: 05/02/2020 Elsevier Patient Education  2024 Elsevier Inc.  

## 2022-12-11 NOTE — Progress Notes (Signed)
BP 126/78 (BP Location: Left Arm, Patient Position: Sitting)   Pulse 71   Ht 5\' 2"  (1.575 m)   Wt 206 lb 6.4 oz (93.6 kg)   LMP  (LMP Unknown)   SpO2 97%   BMI 37.75 kg/m    Subjective:    Patient ID: Holly Norman, female    DOB: 28-Jun-1970, 52 y.o.   MRN: [id number]  HPI: Holly Norman is a 52 y.o. female  Chief Complaint  Patient presents with   Facial Swelling    Patient says she first noticed the issues at hand about two months ago. Patient says she thinks it may be an issues with her tearduct and says she has been applying warm compress and says she will notices some crust around her eye. Patient says this morning when she woke up she noticed what looks like bruising and noticed some drainage in the corner of her eye. Patient says the inner corner itches and says it hurts to touch it.    EYE PAIN Has swelling to exterior left eye.  Started out mild and then this morning woke-up and it was worse, having pain and more swelling.  Thought it was tear duct.  Sees eye doctor, Dr. Clearance Coots in [place], but when called they could not see her for one month. Duration:  months Involved eye:  left Onset: sudden Severity: mild when blinks Quality: dull, aching, and throbbing when blinks Foreign body sensation:no Visual impairment: no Eye redness: no Discharge: yes some mornings it is goopy Crusting or matting of eyelids: yes Swelling: yes Photophobia: no Itching: yes Tearing: yes Headache: no Floaters: no URI symptoms: no Contact lens use: no Close contacts with similar problems: no Eye trauma: no Aggravating factors: blinking and touching area Alleviating factors: ice pack and heat -- has not taken oral medication Status: worse Treatments attempted: as above  Relevant past medical, surgical, family and social history reviewed and updated as indicated. Interim medical history since our last visit reviewed. Allergies and medications reviewed and updated.  Review of Systems   Constitutional:  Negative for activity change, appetite change, diaphoresis, fatigue and fever.  HENT:  Positive for ear discharge and ear pain.   Respiratory:  Negative for cough, chest tightness and shortness of breath.   Cardiovascular:  Negative for chest pain, palpitations and leg swelling.  Gastrointestinal: Negative.   Neurological:  Negative for dizziness, syncope, weakness, light-headedness, numbness and headaches.  Psychiatric/Behavioral: Negative.     Per HPI unless specifically indicated above     Objective:    BP 126/78 (BP Location: Left Arm, Patient Position: Sitting)   Pulse 71   Ht 5\' 2"  (1.575 m)   Wt 206 lb 6.4 oz (93.6 kg)   LMP  (LMP Unknown)   SpO2 97%   BMI 37.75 kg/m   Wt Readings from Last 3 Encounters:  12/11/22 206 lb 6.4 oz (93.6 kg)  11/07/22 207 lb 3.2 oz (94 kg)  05/21/22 204 lb 4.8 oz (92.7 kg)    Physical Exam Vitals and nursing note reviewed.  Constitutional:      General: She is awake. She is not in acute distress.    Appearance: She is well-developed and well-groomed. She is obese. She is not ill-appearing or toxic-appearing.  HENT:     Head: Normocephalic.     Right Ear: Hearing and external ear normal.     Left Ear: Hearing and external ear normal.  Eyes:     General: Lids are normal.  No visual field deficit.       Right eye: No foreign body or discharge.        Left eye: Hordeolum (inner lower lid with white tip) present.No foreign body or discharge.     Extraocular Movements: Extraocular movements intact.     Conjunctiva/sclera: Conjunctivae normal.     Right eye: Right conjunctiva is not injected. No exudate.    Left eye: Left conjunctiva is not injected. Exudate present.     Pupils: Pupils are equal, round, and reactive to light.     Visual Fields: Right eye visual fields normal and left eye visual fields normal.     Comments: Swelling present to exterior left eye, lower aspect with redness.  No warmth.  Mildly tender to touch.   Neck:     Thyroid: No thyromegaly.     Vascular: No carotid bruit.  Cardiovascular:     Rate and Rhythm: Normal rate and regular rhythm.     Heart sounds: Normal heart sounds. No murmur heard.    No gallop.  Pulmonary:     Effort: Pulmonary effort is normal. No accessory muscle usage or respiratory distress.     Breath sounds: Normal breath sounds.  Abdominal:     General: Bowel sounds are normal. There is no distension.     Palpations: Abdomen is soft.     Tenderness: There is no abdominal tenderness.  Musculoskeletal:     Cervical back: Normal range of motion and neck supple.     Right lower leg: No edema.     Left lower leg: No edema.  Lymphadenopathy:     Cervical: No cervical adenopathy.  Skin:    General: Skin is warm and dry.  Neurological:     Mental Status: She is alert and oriented to person, place, and time.     Deep Tendon Reflexes: Reflexes are normal and symmetric.     Reflex Scores:      Brachioradialis reflexes are 2+ on the right side and 2+ on the left side.      Patellar reflexes are 2+ on the right side and 2+ on the left side. Psychiatric:        Attention and Perception: Attention normal.        Mood and Affect: Mood normal.        Speech: Speech normal.        Behavior: Behavior normal. Behavior is cooperative.        Thought Content: Thought content normal.    Results for orders placed or performed in visit on 11/07/22  Bayer DCA Hb A1c Waived  Result Value Ref Range   HB A1C (BAYER DCA - WAIVED) 7.6 (H) 4.8 - 5.6 %  Comprehensive metabolic panel  Result Value Ref Range   Glucose 129 (H) 70 - 99 mg/dL   BUN 17 6 - 24 mg/dL   Creatinine, Ser 1.61 0.57 - 1.00 mg/dL   eGFR 81 >09 UE/AVW/0.98   BUN/Creatinine Ratio 20 9 - 23   Sodium 142 134 - 144 mmol/L   Potassium 4.5 3.5 - 5.2 mmol/L   Chloride 102 96 - 106 mmol/L   CO2 22 20 - 29 mmol/L   Calcium 10.1 8.7 - 10.2 mg/dL   Total Protein 7.2 6.0 - 8.5 g/dL   Albumin 4.3 3.8 - 4.9 g/dL    Globulin, Total 2.9 1.5 - 4.5 g/dL   Bilirubin Total 0.9 0.0 - 1.2 mg/dL   Alkaline Phosphatase 74 44 - 121  IU/L   AST 15 0 - 40 IU/L   ALT 18 0 - 32 IU/L  Lipid Panel w/o Chol/HDL Ratio  Result Value Ref Range   Cholesterol, Total 267 (H) 100 - 199 mg/dL   Triglycerides 161 (H) 0 - 149 mg/dL   HDL 41 >09 mg/dL   VLDL Cholesterol Cal 55 (H) 5 - 40 mg/dL   LDL Chol Calc (NIH) 604 (H) 0 - 99 mg/dL  VITAMIN D 25 Hydroxy (Vit-D Deficiency, Fractures)  Result Value Ref Range   Vit D, 25-Hydroxy 35.7 30.0 - 100.0 ng/mL  Urinalysis, Routine w reflex microscopic  Result Value Ref Range   Specific Gravity, UA >1.030 (H) 1.005 - 1.030   pH, UA 6.0 5.0 - 7.5   Color, UA Yellow Yellow   Appearance Ur Cloudy (A) Clear   Leukocytes,UA Negative Negative   Protein,UA Negative Negative/Trace   Glucose, UA Negative Negative   Ketones, UA Negative Negative   RBC, UA Negative Negative   Bilirubin, UA Negative Negative   Urobilinogen, Ur 0.2 0.2 - 1.0 mg/dL   Nitrite, UA Negative Negative   Microscopic Examination Comment       Assessment & Plan:   Problem List Items Addressed This Visit       Other   Hordeolum externum of left lower eyelid    Refer to periorbital cellulitis.  Is to place warm compresses to eye every 4 hours while awake.      Periorbital cellulitis of left eye - Primary    Acute, started two months ago but has worsened.  Has stye to inner lower lid with white tip present, suspect main cause of infection.  Discussed at length treatment options, she prefers not to go to ER and can not get in to see her eye doctor for one month.  Will start outpatient treatment with close monitoring: Rocephin 500 MG IM in office, Cepodoxime 400 MG Q12H for 7 days, Erythromycin ointment to place in eye, and Prednisone 40 MG for 5 days.  She is aware if any worsening to immediately go to ER as may need IV treatment.  Return in one week.        Follow up plan: Return in about 1 week (around  12/18/2022) for Periorbital Cellulitis.

## 2022-12-11 NOTE — Progress Notes (Signed)
Care Guide Note  12/11/2022 Name: Holly Norman MRN: [id number] DOB: [birth year]  Referred by: Marjie Skiff, NP Reason for referral : Care Coordination (Outreach to schedule with Pharm d )   Holly Norman is a 52 y.o. year old female who is a primary care patient of Cannady, Dorie Rank, NP. Holly Norman was referred to the pharmacist for assistance related to DM.    Successful contact was made with the patient to discuss pharmacy services including being ready for the pharmacist to call at least 5 minutes before the scheduled appointment time, to have medication bottles and any blood sugar or blood pressure readings ready for review. The patient agreed to meet with the pharmacist via with the pharmacist via telephone visit on (date/time).  12/19/2022  Penne Lash, RMA Care Guide [facility]  [place], [place] [address] Direct Dial: [phone] Holly Norman

## 2022-12-12 ENCOUNTER — Encounter: Payer: Self-pay | Admitting: Nurse Practitioner

## 2022-12-15 ENCOUNTER — Telehealth: Payer: Self-pay

## 2022-12-15 NOTE — Telephone Encounter (Signed)
Call returned to patient.  Colonoscopy has been rescheduled from 10/09 to 01/15/23 with Dr. Tobi Bastos due to she has a sore throat and chest congestion.  Trish in [place] notified of date change.  Patient requested not to send new instructions she will use her old one.  Thanks, Marcelino Duster CMA

## 2022-12-15 NOTE — Telephone Encounter (Signed)
Pt requesting call back to schedule colonoscopy.

## 2022-12-18 ENCOUNTER — Ambulatory Visit: Payer: 59 | Admitting: Nurse Practitioner

## 2022-12-19 ENCOUNTER — Other Ambulatory Visit: Payer: 59

## 2022-12-19 NOTE — Progress Notes (Signed)
12/19/2022 Name: Holly Norman MRN: [id number] DOB: [birth year]  Chief Complaint  Patient presents with   Medication Assistance   Holly Norman is a 52 y.o. year old female who presented for a telephone visit.   They were referred to the pharmacist by their PCP for assistance in managing medication access.   Subjective:  Care Team: Primary Care Provider: Marjie Skiff, NP ; Next Scheduled Visit: 12/6  Medication Access/Adherence  Current Pharmacy:  [facility] [address] [place] - [address] [address] Phone: [phone] Fax: [phone]  -Patient reports affordability concerns with their medications: Yes  -Patient reports access/transportation concerns to their pharmacy: No  -Patient reports adherence concerns with their medications:  Yes  Has not been able to pick up/take GLP1 due to cost  Diabetes Medication Access/Affordability Current medications: glipizide 5mg  BID, metformin 1000mg  BID -Patient has previously taken Ozempic and Trulicity, but both medications are no longer affordable to her due to copays >$1000 -Current insurance plan has a deductible of $7500, which likely has not been met -Patient prefers Ozempic over Trulicity due to injection pain with Trulicity.  Both products have manufacturer copay cards, but Ozempic only pays $150 toward patient's copay.  Trulicity pays more but does have maximum yearly limit of $1800.  Objective: Lab Results  Component Value Date   HGBA1C 7.6 (H) 11/07/2022   Lab Results  Component Value Date   CREATININE 0.86 11/07/2022   BUN 17 11/07/2022   NA 142 11/07/2022   K 4.5 11/07/2022   CL 102 11/07/2022   CO2 22 11/07/2022   Lab Results  Component Value Date   CHOL 267 (H) 11/07/2022   HDL 41 11/07/2022   LDLCALC 171 (H) 11/07/2022   LDLDIRECT 170 (H) 04/03/2020   TRIG 290 (H) 11/07/2022   CHOLHDL 5.0 (H) 04/02/2021   Medications Reviewed Today     Reviewed by  Lenna Gilford, [facility] (Pharmacist) on 12/19/22 at [phone]  Med List Status: <None>   Medication Order Taking? Sig Documenting Provider Last Dose Status Informant  albuterol (VENTOLIN HFA) 108 (90 Base) MCG/ACT inhaler [id number]  Inhale 2 puffs into the lungs every 6 (six) hours as needed for wheezing or shortness of breath. Waldon Merl, PA-C  Active   atenolol (TENORMIN) 50 MG tablet [id number]  Take 1 tablet (50 mg total) by mouth daily. Aura Dials T, NP  Active   busPIRone (BUSPAR) 10 MG tablet [id number]  Take 1 tablet (10 mg total) by mouth 2 (two) times daily. Aura Dials T, NP  Active   Cholecalciferol 1.25 MG (50000 UT) TABS [id number]  Take 1 tablet by mouth once a week. Aura Dials T, NP  Active   citalopram (CELEXA) 20 MG tablet [id number]  Take 1 tablet (20 mg total) by mouth daily. Aura Dials T, NP  Active   cyanocobalamin (VITAMIN B12) 1000 MCG tablet [id number]  Take 1 tablet (1,000 mcg total) by mouth daily. Aura Dials T, NP  Active   cyclobenzaprine (FLEXERIL) 10 MG tablet [id number]  Take 1 tablet (10 mg total) by mouth 3 (three) times daily as needed for muscle spasms. Aura Dials T, NP  Active   Dulaglutide (TRULICITY) 0.75 MG/0.5ML SOPN [id number]  Inject 0.75 mg into the skin once a week. Aura Dials T, NP  Active   ezetimibe (ZETIA) 10 MG tablet [id number]  Take 1 tablet (10 mg total) by mouth daily. Marjie Skiff, NP  Active   fenofibrate (  TRICOR) 145 MG tablet [id number]  Take 1 tablet (145 mg total) by mouth daily. Aura Dials T, NP  Active   gabapentin (NEURONTIN) 100 MG capsule [id number]  Take 1 capsule (100 mg total) by mouth at bedtime. Aura Dials T, NP  Active   glipiZIDE (GLUCOTROL) 5 MG tablet [id number] Yes Take 1 tablet (5 mg total) by mouth 2 (two) times daily before a meal. Cannady, Jolene T, NP Taking Active   losartan (COZAAR) 100 MG tablet [id number]  Take 1 tablet (100 mg total) by mouth daily. Aura Dials T, NP  Active    meclizine (ANTIVERT) 25 MG tablet [id number]  Take 1 tablet (25 mg total) by mouth 3 (three) times daily as needed for dizziness. Aura Dials T, NP  Active   metFORMIN (GLUCOPHAGE) 1000 MG tablet [id number] Yes Take 1 tablet (1,000 mg total) by mouth 2 (two) times daily with a meal. Cannady, Jolene T, NP Taking Active   Omega-3 Fatty Acids (FISH OIL) 1000 MG CAPS [id number]  Take 1,000 mg by mouth daily. [provider]  Active   rosuvastatin (CRESTOR) 40 MG tablet [id number]  Take 1 tablet (40 mg total) by mouth daily. Marjie Skiff, NP  Active            Assessment/Plan:   Diabetes Medication Access/Affordability: - Currently uncontrolled - Obtained Trulicity copay card and provided to [facility]; this took 1 month copay to $25.  I recommend taking Trulicity for as long as copay card will make affordable. - Educated patient on current insurance benefit, and she plans to look at coverage with lower deductibles for 2025 now that she is aware  Follow Up Plan: Tried to call patient back to inform her Trulicity copay will now be $25 and schedule a telephone follow-up; but I was not able to reach her.  Sending a MyChart message and will try to call again next week if I do not hear back.  Lenna Gilford, PharmD, DPLA

## 2023-01-12 ENCOUNTER — Telehealth: Payer: Self-pay

## 2023-01-12 MED ORDER — NA SULFATE-K SULFATE-MG SULF 17.5-3.13-1.6 GM/177ML PO SOLN: 1.0000 | Freq: Once | ORAL | 0 refills | Status: AC

## 2023-01-12 NOTE — Telephone Encounter (Signed)
Patient has requested Suprep to be resent to pharmacy.  Prep has been sent to [facility] on [address].  Voice message has been left to let patient know.  Thanks,  [place], [place]

## 2023-01-13 ENCOUNTER — Other Ambulatory Visit: Payer: Self-pay | Admitting: Nurse Practitioner

## 2023-01-14 NOTE — Telephone Encounter (Signed)
Requested by interface surescripts. Future visit in 1 month .  Requested Prescriptions  Pending Prescriptions Disp Refills   busPIRone (BUSPAR) 10 MG tablet [Pharmacy Med Name: busPIRone HCl 10 MG Oral Tablet] 30 tablet 0    Sig: Take 1 tablet by mouth twice daily     Psychiatry: Anxiolytics/Hypnotics - Non-controlled Passed - 01/13/2023  3:58 PM      Passed - Valid encounter within last 12 months    Recent Outpatient Visits           1 month ago Periorbital cellulitis of left eye   [place] [facility] [place], [place] T, NP   2 months ago Type 2 diabetes mellitus with obesity (HCC)   [place] [facility] [place], Corrie Dandy T, NP   5 months ago Sciatica of right side   [place] [facility] [place], Megan P, DO   7 months ago Type 2 diabetes mellitus with obesity (HCC)   [place] [facility] [place], Corrie Dandy T, NP   8 months ago Hematoma   [place] [facility] [place], Dorie Rank, NP       Future Appointments             In 1 month Cannady, Dorie Rank, NP [place] [facility]

## 2023-01-15 ENCOUNTER — Other Ambulatory Visit: Payer: Self-pay

## 2023-01-15 ENCOUNTER — Ambulatory Visit
Admission: RE | Admit: 2023-01-15 | Discharge: 2023-01-15 | Disposition: A | Discharge status: Home / Self Care | Payer: 59 | Attending: Gastroenterology | Admitting: Gastroenterology

## 2023-01-15 ENCOUNTER — Encounter
Admission: RE | Disposition: A | Discharge status: Home / Self Care | Payer: Self-pay | Source: Home / Self Care | Attending: Gastroenterology

## 2023-01-15 ENCOUNTER — Ambulatory Visit: Payer: 59 | Admitting: Anesthesiology

## 2023-01-15 ENCOUNTER — Encounter: Payer: Self-pay | Admitting: Gastroenterology

## 2023-01-15 DIAGNOSIS — K219 Gastro-esophageal reflux disease without esophagitis: Secondary | ICD-10-CM | POA: Insufficient documentation

## 2023-01-15 DIAGNOSIS — Z800 Family history of malignant neoplasm of digestive organs: Secondary | ICD-10-CM | POA: Diagnosis not present

## 2023-01-15 DIAGNOSIS — F32A Depression, unspecified: Secondary | ICD-10-CM | POA: Insufficient documentation

## 2023-01-15 DIAGNOSIS — Z1211 Encounter for screening for malignant neoplasm of colon: Secondary | ICD-10-CM | POA: Insufficient documentation

## 2023-01-15 DIAGNOSIS — F419 Anxiety disorder, unspecified: Secondary | ICD-10-CM | POA: Insufficient documentation

## 2023-01-15 DIAGNOSIS — I10 Essential (primary) hypertension: Secondary | ICD-10-CM | POA: Diagnosis not present

## 2023-01-15 DIAGNOSIS — E119 Type 2 diabetes mellitus without complications: Secondary | ICD-10-CM | POA: Diagnosis not present

## 2023-01-15 HISTORY — PX: COLONOSCOPY WITH PROPOFOL: SHX5780

## 2023-01-15 LAB — GLUCOSE, CAPILLARY: Glucose-Capillary: 133 mg/dL — ABNORMAL HIGH (ref 70–99)

## 2023-01-15 SURGERY — COLONOSCOPY WITH PROPOFOL
Anesthesia: General

## 2023-01-15 MED ORDER — PROPOFOL 500 MG/50ML IV EMUL
INTRAVENOUS | Status: DC | PRN
  Administered 2023-01-15: 140 ug/kg/min via INTRAVENOUS

## 2023-01-15 MED ORDER — PROPOFOL 10 MG/ML IV BOLUS
INTRAVENOUS | Status: DC | PRN
  Administered 2023-01-15: 70 mg via INTRAVENOUS
  Administered 2023-01-15: 30 mg via INTRAVENOUS

## 2023-01-15 MED ORDER — SODIUM CHLORIDE 0.9 % IV SOLN: INTRAVENOUS | Status: DC

## 2023-01-15 MED ORDER — LIDOCAINE HCL (CARDIAC) PF 100 MG/5ML IV SOSY
PREFILLED_SYRINGE | INTRAVENOUS | Status: DC | PRN
  Administered 2023-01-15: 60 mg via INTRAVENOUS

## 2023-01-15 NOTE — Op Note (Signed)
[facility] Patient Name: Holly Norman Procedure Date: 01/15/2023 9:16 AM MRN: [id number] Account #: [id number] Date of Birth: 07-08-1970 Admit Type: Outpatient Age: 52 Room: [facility] ENDO ROOM 2 Gender: Female Note Status: Finalized Instrument Name: Prentice Docker [id number] Procedure:             Colonoscopy Indications:           Screening for colorectal malignant neoplasm Providers:             Wyline Mood MD, MD Referring MD:          Dorie Rank. [place] (Referring MD) Medicines:             Monitored Anesthesia Care Complications:         No immediate complications. Procedure:             Pre-Anesthesia Assessment:                        - Prior to the procedure, a History and Physical was                         performed, and patient medications, allergies and                         sensitivities were reviewed. The patient's tolerance                         of previous anesthesia was reviewed.                        - The risks and benefits of the procedure and the                         sedation options and risks were discussed with the                         patient. All questions were answered and informed                         consent was obtained.                        - ASA Grade Assessment: II - A patient with mild                         systemic disease.                        After obtaining informed consent, the colonoscope was                         passed under direct vision. Throughout the procedure,                         the patient's blood pressure, pulse, and oxygen                         saturations were monitored continuously. The                         Colonoscope was introduced  through the anus and                         advanced to the the cecum, identified by the                         appendiceal orifice. The colonoscopy was performed                         with ease. The patient tolerated the procedure well.                          The quality of the bowel preparation was excellent.                         The ileocecal valve, appendiceal orifice, and rectum                         were photographed. Findings:      The entire examined colon appeared normal. Impression:            - The entire examined colon is normal.                        - No specimens collected. Recommendation:        - Discharge patient to home (with escort).                        - Resume previous diet.                        - Continue present medications.                        - Repeat colonoscopy in 10 years for screening                         purposes. Procedure Code(s):     --- Professional ---                        [phone], Colonoscopy, flexible; diagnostic, including                         collection of specimen(s) by brushing or washing, when                         performed (separate procedure) Diagnosis Code(s):     --- Professional ---                        Z12.11, Encounter for screening for malignant neoplasm                         of colon CPT copyright 2022 American Medical Association. All rights reserved. The codes documented in this report are preliminary and upon coder review may  be revised to meet current compliance requirements. Wyline Mood, MD Wyline Mood MD, MD 01/15/2023 9:39:24 AM This report has been signed electronically. Number of Addenda: 0 Note Initiated On: 01/15/2023 9:16 AM Scope Withdrawal Time: 0 hours 7 minutes 39 seconds  Total Procedure  Duration: 0 hours 9 minutes 26 seconds  Estimated Blood Loss:  Estimated blood loss: none.      [facility]

## 2023-01-15 NOTE — Transfer of Care (Signed)
Immediate Anesthesia Transfer of Care Note  Patient: Holly Norman  Procedure(s) Performed: COLONOSCOPY WITH PROPOFOL  Patient Location: PACU and Endoscopy Unit  Anesthesia Type:General  Level of Consciousness: drowsy and patient cooperative  Airway & Oxygen Therapy: Patient Spontanous Breathing  Post-op Assessment: Report given to RN and Post -op Vital signs reviewed and stable  Post vital signs: Reviewed and stable  Last Vitals:  Vitals Value Taken Time  BP 141/88 01/15/23 0943  Temp 36.4 C 01/15/23 0943  Pulse 80 01/15/23 0943  Resp 18 01/15/23 0943  SpO2 99 % 01/15/23 0943  Vitals shown include unfiled device data.  Last Pain:  Vitals:   01/15/23 0943  TempSrc: Temporal  PainSc: 0-No pain         Complications: No notable events documented.

## 2023-01-15 NOTE — Anesthesia Preprocedure Evaluation (Signed)
Anesthesia Evaluation  Patient identified by MRN, date of birth, ID band Patient awake    Reviewed: Allergy & Precautions, NPO status , Patient's Chart, lab work & pertinent test results  History of Anesthesia Complications Negative for: history of anesthetic complications  Airway Mallampati: III  TM Distance: >3 FB Neck ROM: Full    Dental  (+) Upper Dentures, Implants   Pulmonary neg pulmonary ROS, neg sleep apnea, neg COPD, Patient abstained from smoking.Not current smoker   Pulmonary exam normal breath sounds clear to auscultation       Cardiovascular Exercise Tolerance: Good METShypertension, Pt. on medications (-) CAD and (-) Past MI (-) dysrhythmias  Rhythm:Regular Rate:Normal - Systolic murmurs    Neuro/Psych  PSYCHIATRIC DISORDERS Anxiety Depression    negative neurological ROS     GI/Hepatic ,GERD  ,,(+)     (-) substance abuse    Endo/Other  diabetes  Has not taken GLP1 this year  Renal/GU negative Renal ROS     Musculoskeletal   Abdominal  (+) + obese  Peds  Hematology   Anesthesia Other Findings Past Medical History: No date: Diabetes mellitus without complication (HCC) No date: Hyperlipidemia No date: Hypertension  Reproductive/Obstetrics                             Anesthesia Physical Anesthesia Plan  ASA: 2  Anesthesia Plan: General   Post-op Pain Management: Minimal or no pain anticipated   Induction: Intravenous  PONV Risk Score and Plan: 3 and Propofol infusion, TIVA and Ondansetron  Airway Management Planned: Nasal Cannula  Additional Equipment: None  Intra-op Plan:   Post-operative Plan:   Informed Consent: I have reviewed the patients History and Physical, chart, labs and discussed the procedure including the risks, benefits and alternatives for the proposed anesthesia with the patient or authorized representative who has indicated his/her  understanding and acceptance.     Dental advisory given  Plan Discussed with: CRNA and Surgeon  Anesthesia Plan Comments: (Discussed risks of anesthesia with patient, including possibility of difficulty with spontaneous ventilation under anesthesia necessitating airway intervention, PONV, and rare risks such as cardiac or respiratory or neurological events, and allergic reactions. Discussed the role of CRNA in patient's perioperative care. Patient understands.)       Anesthesia Quick Evaluation

## 2023-01-15 NOTE — Anesthesia Postprocedure Evaluation (Signed)
Anesthesia Post Note  Patient: Holly Norman  Procedure(s) Performed: COLONOSCOPY WITH PROPOFOL  Patient location during evaluation: Endoscopy Anesthesia Type: General Level of consciousness: awake and alert Pain management: pain level controlled Vital Signs Assessment: post-procedure vital signs reviewed and stable Respiratory status: spontaneous breathing, nonlabored ventilation, respiratory function stable and patient connected to nasal cannula oxygen Cardiovascular status: blood pressure returned to baseline and stable Postop Assessment: no apparent nausea or vomiting Anesthetic complications: no   No notable events documented.   Last Vitals:  Vitals:   01/15/23 0941 01/15/23 0943  BP: (!) 141/88 (!) 141/88  Pulse:  83  Resp:  18  Temp: (!) 36.1 C 36.4 C  SpO2: 98% 99%    Last Pain:  Vitals:   01/15/23 0951  TempSrc:   PainSc: 0-No pain                 Corinda Gubler

## 2023-01-15 NOTE — H&P (Signed)
Wyline Mood, MD [address], [place], [place], [address] [address], [place], [place], [address] Phone: [phone]  Fax: [phone]  Primary Care Physician:  Marjie Skiff, NP   Pre-Procedure History & Physical: HPI:  Holly Norman is a 52 y.o. female is here for an colonoscopy.   Past Medical History:  Diagnosis Date   Diabetes mellitus without complication (HCC)    Hyperlipidemia    Hypertension     Past Surgical History:  Procedure Laterality Date   CARDIAC ELECTROPHYSIOLOGY STUDY AND ABLATION     CESAREAN SECTION     rt ankle surgery     TOTAL ABDOMINAL HYSTERECTOMY      Prior to Admission medications   Medication Sig Start Date End Date Taking? Authorizing Provider  albuterol (VENTOLIN HFA) 108 (90 Base) MCG/ACT inhaler Inhale 2 puffs into the lungs every 6 (six) hours as needed for wheezing or shortness of breath. 02/10/22  Yes Waldon Merl, PA-C  atenolol (TENORMIN) 50 MG tablet Take 1 tablet (50 mg total) by mouth daily. 05/21/22  Yes Cannady, Jolene T, NP  busPIRone (BUSPAR) 10 MG tablet Take 1 tablet by mouth twice daily 01/14/23  Yes Cannady, Jolene T, NP  Cholecalciferol 1.25 MG (50000 UT) TABS Take 1 tablet by mouth once a week. 06/04/22  Yes Cannady, Jolene T, NP  citalopram (CELEXA) 20 MG tablet Take 1 tablet (20 mg total) by mouth daily. 05/21/22  Yes Cannady, Jolene T, NP  cyanocobalamin (VITAMIN B12) 1000 MCG tablet Take 1 tablet (1,000 mcg total) by mouth daily. 05/22/22  Yes Cannady, Jolene T, NP  cyclobenzaprine (FLEXERIL) 10 MG tablet Take 1 tablet (10 mg total) by mouth 3 (three) times daily as needed for muscle spasms. 11/07/22  Yes Cannady, Jolene T, NP  ezetimibe (ZETIA) 10 MG tablet Take 1 tablet (10 mg total) by mouth daily. 05/21/22  Yes Cannady, Jolene T, NP  fenofibrate (TRICOR) 145 MG tablet Take 1 tablet (145 mg total) by mouth daily. 05/21/22  Yes Cannady, Jolene T, NP  gabapentin (NEURONTIN) 100 MG capsule  Take 1 capsule (100 mg total) by mouth at bedtime. 11/07/22  Yes Cannady, Jolene T, NP  glipiZIDE (GLUCOTROL) 5 MG tablet Take 1 tablet (5 mg total) by mouth 2 (two) times daily before a meal. 05/21/22  Yes Cannady, Jolene T, NP  losartan (COZAAR) 100 MG tablet Take 1 tablet (100 mg total) by mouth daily. 05/21/22  Yes Cannady, Corrie Dandy T, NP  meclizine (ANTIVERT) 25 MG tablet Take 1 tablet (25 mg total) by mouth 3 (three) times daily as needed for dizziness. 05/21/22  Yes Cannady, Corrie Dandy T, NP  metFORMIN (GLUCOPHAGE) 1000 MG tablet Take 1 tablet (1,000 mg total) by mouth 2 (two) times daily with a meal. 05/21/22  Yes Cannady, Jolene T, NP  Omega-3 Fatty Acids (FISH OIL) 1000 MG CAPS Take 1,000 mg by mouth daily.   Yes [provider]  rosuvastatin (CRESTOR) 40 MG tablet Take 1 tablet (40 mg total) by mouth daily. 06/04/22  Yes Cannady, Jolene T, NP  Dulaglutide (TRULICITY) 0.75 MG/0.5ML SOPN Inject 0.75 mg into the skin once a week. Patient not taking: Reported on 12/19/2022 12/11/22   Aura Dials T, NP    Allergies as of 11/13/2022 - Review Complete 11/07/2022  Allergen Reaction Noted   Ivp dye [iodinated contrast media] Shortness Of Breath 10/24/2014   Tape Rash 04/02/2021    Family History  Problem Relation Age of Onset  Diabetes Mother    Heart disease Mother    Hypertension Mother    Heart disease Father 74   Diabetes Father    Hypertension Father    Stroke Father    Cancer Maternal Grandmother        breast and colon   Breast cancer Maternal Grandmother 1   Heart disease Maternal Grandfather    Heart disease Paternal Grandfather     Social History   Socioeconomic History   Marital status: Married    Spouse name: Not on file   Number of children: Not on file   Years of education: Not on file   Highest education level: Not on file  Occupational History   Not on file  Tobacco Use   Smoking status: Never   Smokeless tobacco: Never  Vaping Use   Vaping status:  Never Used  Substance and Sexual Activity   Alcohol use: No    Alcohol/week: 0.0 standard drinks of alcohol   Drug use: No   Sexual activity: Yes  Other Topics Concern   Not on file  Social History Narrative   Not on file   Social Determinants of Health   Financial Resource Strain: Low Risk  (10/05/2020)   Overall Financial Resource Strain (CARDIA)    Difficulty of Paying Living Expenses: Not very hard  Food Insecurity: No Food Insecurity (10/05/2020)   Hunger Vital Sign    Worried About Running Out of Food in the Last Year: Never true    Ran Out of Food in the Last Year: Never true  Transportation Needs: No Transportation Needs (10/05/2020)   PRAPARE - Administrator, Civil Service (Medical): No    Lack of Transportation (Non-Medical): No  Physical Activity: Not on file  Stress: No Stress Concern Present (10/05/2020)   [employer] of Occupational Health - Occupational Stress Questionnaire    Feeling of Stress : Only a little  Social Connections: Unknown (10/05/2020)   Social Connection and Isolation Panel [NHANES]    Frequency of Communication with Friends and Family: More than three times a week    Frequency of Social Gatherings with Friends and Family: More than three times a week    Attends Religious Services: Not on file    Active Member of Clubs or Organizations: Not on file    Attends Banker Meetings: Not on file    Marital Status: Married  Intimate Partner Violence: Not At Risk (10/05/2020)   Humiliation, Afraid, Rape, and Kick questionnaire    Fear of Current or Ex-Partner: No    Emotionally Abused: No    Physically Abused: No    Sexually Abused: No    Review of Systems: See HPI, otherwise negative ROS  Physical Exam: BP (!) 172/97   Pulse 77   Temp 97.6 F (36.4 C) (Temporal)   Wt 91.6 kg   LMP  (LMP Unknown)   SpO2 99%   BMI 36.95 kg/m  General:   Alert,  pleasant and cooperative in NAD Head:  Normocephalic and  atraumatic. Neck:  Supple; no masses or thyromegaly. Lungs:  Clear throughout to auscultation, normal respiratory effort.    Heart:  +S1, +S2, Regular rate and rhythm, No edema. Abdomen:  Soft, nontender and nondistended. Normal bowel sounds, without guarding, and without rebound.   Neurologic:  Alert and  oriented x4;  grossly normal neurologically.  Impression/Plan: Holly Norman is here for an colonoscopy to be performed for Screening colonoscopy average risk   Risks,  benefits, limitations, and alternatives regarding  colonoscopy have been reviewed with the patient.  Questions have been answered.  All parties agreeable.   Wyline Mood, MD  01/15/2023, 8:54 AM

## 2023-01-16 ENCOUNTER — Encounter: Payer: Self-pay | Admitting: Gastroenterology

## 2023-01-22 ENCOUNTER — Encounter: Payer: Self-pay | Admitting: Nurse Practitioner

## 2023-01-22 MED ORDER — TRULICITY 0.75 MG/0.5ML ~~LOC~~ SOAJ: 0.7500 mg | SUBCUTANEOUS | 1 refills | Status: DC

## 2023-02-07 NOTE — Patient Instructions (Incomplete)
Be Involved in Caring For Your Health:  Taking Medications When medications are taken as directed, they can greatly improve your health. But if they are not taken as prescribed, they may not work. In some cases, not taking them correctly can be harmful. To help ensure your treatment remains effective and safe, understand your medications and how to take them. Bring your medications to each visit for review by your provider.  Your lab results, notes, and after visit summary will be available on My Chart. We strongly encourage you to use this feature. If lab results are abnormal the clinic will contact you with the appropriate steps. If the clinic does not contact you assume the results are satisfactory. You can always view your results on My Chart. If you have questions regarding your health or results, please contact the clinic during office hours. You can also ask questions on My Chart.  We at [facility] are grateful that you chose [place] to provide your care. We strive to provide evidence-based and compassionate care and are always looking for feedback. If you get a survey from the clinic please complete this so we can hear your opinions.  Diabetes Mellitus and Foot Care Diabetes, also called diabetes mellitus, may cause problems with your feet and legs because of poor blood flow (circulation). Poor circulation may make your skin: Become thinner and drier. Break more easily. Heal more slowly. Peel and crack. You may also have nerve damage (neuropathy). This can cause decreased feeling in your legs and feet. This means that you may not notice minor injuries to your feet that could lead to more serious problems. Finding and treating problems early is the best way to prevent future foot problems. How to care for your feet Foot hygiene  Wash your feet daily with warm water and mild soap. Do not use hot water. Then, pat your feet and the areas between your toes until they are fully dry. Do  not soak your feet. This can dry your skin. Trim your toenails straight across. Do not dig under them or around the cuticle. File the edges of your nails with an emery board or nail file. Apply a moisturizing lotion or petroleum jelly to the skin on your feet and to dry, brittle toenails. Use lotion that does not contain alcohol and is unscented. Do not apply lotion between your toes. Shoes and socks Wear clean socks or stockings every day. Make sure they are not too tight. Do not wear knee-high stockings. These may decrease blood flow to your legs. Wear shoes that fit well and have enough cushioning. Always look in your shoes before you put them on to be sure there are no objects inside. To break in new shoes, wear them for just a few hours a day. This prevents injuries on your feet. Wounds, scrapes, corns, and calluses  Check your feet daily for blisters, cuts, bruises, sores, and redness. If you cannot see the bottom of your feet, use a mirror or ask someone for help. Do not cut off corns or calluses or try to remove them with medicine. If you find a minor scrape, cut, or break in the skin on your feet, keep it and the skin around it clean and dry. You may clean these areas with mild soap and water. Do not clean the area with peroxide, alcohol, or iodine. If you have a wound, scrape, corn, or callus on your foot, look at it several times a day to make sure it  is healing and not infected. Check for: Redness, swelling, or pain. Fluid or blood. Warmth. Pus or a bad smell. General tips Do not cross your legs. This may decrease blood flow to your feet. Do not use heating pads or hot water bottles on your feet. They may burn your skin. If you have lost feeling in your feet or legs, you may not know this is happening until it is too late. Protect your feet from hot and cold by wearing shoes, such as at the beach or on hot pavement. Schedule a complete foot exam at least once a year or more often if  you have foot problems. Report any cuts, sores, or bruises to your health care provider right away. Where to find more information American Diabetes Association: diabetes.org Association of Diabetes Care & Education Specialists: diabeteseducator.org Contact a health care provider if: You have a condition that increases your risk of infection, and you have any cuts, sores, or bruises on your feet. You have an injury that is not healing. You have redness on your legs or feet. You feel burning or tingling in your legs or feet. You have pain or cramps in your legs and feet. Your legs or feet are numb. Your feet always feel cold. You have pain around any toenails. Get help right away if: You have a wound, scrape, corn, or callus on your foot and: You have signs of infection. You have a fever. You have a red line going up your leg. This information is not intended to replace advice given to you by your health care provider. Make sure you discuss any questions you have with your health care provider. Document Revised: 08/28/2021 Document Reviewed: 08/28/2021 Elsevier Patient Education  2024 [employer].

## 2023-02-13 ENCOUNTER — Ambulatory Visit: Payer: 59 | Admitting: Nurse Practitioner

## 2023-02-13 DIAGNOSIS — E1169 Type 2 diabetes mellitus with other specified complication: Secondary | ICD-10-CM

## 2023-02-13 DIAGNOSIS — F419 Anxiety disorder, unspecified: Secondary | ICD-10-CM

## 2023-02-13 DIAGNOSIS — F330 Major depressive disorder, recurrent, mild: Secondary | ICD-10-CM

## 2023-02-13 DIAGNOSIS — I152 Hypertension secondary to endocrine disorders: Secondary | ICD-10-CM

## 2023-02-13 DIAGNOSIS — E538 Deficiency of other specified B group vitamins: Secondary | ICD-10-CM

## 2023-02-20 ENCOUNTER — Telehealth: Payer: Self-pay

## 2023-02-20 NOTE — Progress Notes (Signed)
   02/20/2023  Patient ID: Holly Norman, female   DOB: 1970-04-27, 52 y.o.   MRN: [id number]  Patient outreach to attempt to schedule a telephone follow-up visit was unsuccessful.  I was able to leave a HIPAA compliant voicemail with my direct phone number for patient to call at her convenience.  Lenna Gilford, PharmD, DPLA

## 2023-03-09 ENCOUNTER — Ambulatory Visit: Payer: 59 | Admitting: Nurse Practitioner

## 2023-03-12 ENCOUNTER — Other Ambulatory Visit: Payer: Self-pay | Admitting: Nurse Practitioner

## 2023-03-16 NOTE — Telephone Encounter (Signed)
 Requested by interface surescripts. Future visit in 1 week.  Requested Prescriptions  Pending Prescriptions Disp Refills   busPIRone  (BUSPAR ) 10 MG tablet [Pharmacy Med Name: busPIRone  HCl 10 MG Oral Tablet] 30 tablet 0    Sig: Take 1 tablet by mouth twice daily     Psychiatry: Anxiolytics/Hypnotics - Non-controlled Passed - 03/16/2023  2:54 PM      Passed - Valid encounter within last 12 months    Recent Outpatient Visits           3 months ago Periorbital cellulitis of left eye   [facility] [place], [place] T, NP   4 months ago Type 2 diabetes mellitus with obesity (HCC)   [place] [facility] [place], Melanie T, NP   7 months ago Sciatica of right side   [place] [facility] [place], Megan P, DO   9 months ago Type 2 diabetes mellitus with obesity (HCC)   [place] [facility] [place], Melanie T, NP   10 months ago Hematoma   [place] [facility] [place], Melanie DASEN, NP       Future Appointments             In 1 week Cannady, Melanie DASEN, NP [place] [facility]

## 2023-03-27 ENCOUNTER — Ambulatory Visit: Payer: 59 | Admitting: Nurse Practitioner

## 2023-03-27 DIAGNOSIS — E1159 Type 2 diabetes mellitus with other circulatory complications: Secondary | ICD-10-CM

## 2023-03-27 DIAGNOSIS — G2581 Restless legs syndrome: Secondary | ICD-10-CM

## 2023-03-27 DIAGNOSIS — E538 Deficiency of other specified B group vitamins: Secondary | ICD-10-CM

## 2023-03-27 DIAGNOSIS — E559 Vitamin D deficiency, unspecified: Secondary | ICD-10-CM

## 2023-03-27 DIAGNOSIS — F419 Anxiety disorder, unspecified: Secondary | ICD-10-CM

## 2023-03-27 DIAGNOSIS — F330 Major depressive disorder, recurrent, mild: Secondary | ICD-10-CM

## 2023-03-27 DIAGNOSIS — Z23 Encounter for immunization: Secondary | ICD-10-CM

## 2023-03-27 DIAGNOSIS — E1169 Type 2 diabetes mellitus with other specified complication: Secondary | ICD-10-CM

## 2023-03-27 DIAGNOSIS — F5101 Primary insomnia: Secondary | ICD-10-CM

## 2023-04-13 LAB — HM DIABETES EYE EXAM

## 2023-04-29 ENCOUNTER — Other Ambulatory Visit: Payer: Self-pay | Admitting: Ophthalmology

## 2023-04-29 DIAGNOSIS — L03213 Periorbital cellulitis: Secondary | ICD-10-CM

## 2023-05-06 ENCOUNTER — Ambulatory Visit: Payer: 59

## 2023-05-12 ENCOUNTER — Encounter: Payer: Self-pay | Admitting: Nurse Practitioner

## 2023-05-14 ENCOUNTER — Other Ambulatory Visit: Payer: Self-pay | Admitting: Nurse Practitioner

## 2023-05-15 NOTE — Telephone Encounter (Signed)
 Requested Prescriptions  Pending Prescriptions Disp Refills   busPIRone (BUSPAR) 10 MG tablet [Pharmacy Med Name: busPIRone HCl 10 MG Oral Tablet] 30 tablet 0    Sig: Take 1 tablet by mouth twice daily     Psychiatry: Anxiolytics/Hypnotics - Non-controlled Passed - 05/15/2023  7:45 AM      Passed - Valid encounter within last 12 months    Recent Outpatient Visits           5 months ago Periorbital cellulitis of left eye   [place] [facility] [place], [place] T, NP   6 months ago Type 2 diabetes mellitus with obesity (HCC)   [place] [facility] [place], Corrie Dandy T, NP   9 months ago Sciatica of right side   [place] [facility] [place], Megan P, DO   11 months ago Type 2 diabetes mellitus with obesity (HCC)   [place] [facility] [place], Corrie Dandy T, NP   1 year ago Hematoma   [place] [facility] [place], Dorie Rank, NP

## 2023-05-18 ENCOUNTER — Encounter: Payer: Self-pay | Admitting: Nurse Practitioner

## 2023-05-19 MED ORDER — TRULICITY 0.75 MG/0.5ML ~~LOC~~ SOAJ: 0.7500 mg | SUBCUTANEOUS | 1 refills | Status: DC

## 2023-06-17 ENCOUNTER — Telehealth: Payer: Self-pay | Admitting: Nurse Practitioner

## 2023-06-17 NOTE — Telephone Encounter (Signed)
 PA for Trulicity initiated and submitted via Cover My Meds. Key: [id number]

## 2023-06-19 NOTE — Telephone Encounter (Signed)
PA approved. Patient notified of approval via Mychart message.  

## 2023-07-13 ENCOUNTER — Other Ambulatory Visit: Payer: Self-pay | Admitting: Nurse Practitioner

## 2023-07-15 NOTE — Telephone Encounter (Signed)
 OFFICE VISIT NEEDED FOR ADDITIONAL REFILLS.  Requested Prescriptions  Pending Prescriptions Disp Refills   losartan  (COZAAR ) 100 MG tablet [Pharmacy Med Name: Losartan  Potassium 100 MG Oral Tablet] 30 tablet 0    Sig: Take 1 tablet by mouth once daily     Cardiovascular:  Angiotensin Receptor Blockers Failed - 07/15/2023 11:37 AM      Failed - Cr in normal range and within 180 days    Creatinine, Ser  Date Value Ref Range Status  11/07/2022 0.86 0.57 - 1.00 mg/dL Final         Failed - K in normal range and within 180 days    Potassium  Date Value Ref Range Status  11/07/2022 4.5 3.5 - 5.2 mmol/L Final         Failed - Last BP in normal range    BP Readings from Last 1 Encounters:  01/15/23 (!) 141/88         Failed - Valid encounter within last 6 months    Recent Outpatient Visits   None            Passed - Patient is not pregnant

## 2023-07-15 NOTE — Telephone Encounter (Signed)
 OFFICE VISIT NEEDED FOR ADDITIONAL REFILLS.  Requested Prescriptions  Pending Prescriptions Disp Refills   atenolol  (TENORMIN ) 50 MG tablet [Pharmacy Med Name: Atenolol  50 MG Oral Tablet] 30 tablet 0    Sig: Take 1 tablet by mouth once daily     Cardiovascular: Beta Blockers 2 Failed - 07/15/2023 11:38 AM      Failed - Last BP in normal range    BP Readings from Last 1 Encounters:  01/15/23 (!) 141/88         Failed - Valid encounter within last 6 months    Recent Outpatient Visits   None            Passed - Cr in normal range and within 360 days    Creatinine, Ser  Date Value Ref Range Status  11/07/2022 0.86 0.57 - 1.00 mg/dL Final         Passed - Last Heart Rate in normal range    Pulse Readings from Last 1 Encounters:  01/15/23 83

## 2023-07-23 NOTE — Telephone Encounter (Signed)
 error

## 2023-08-18 ENCOUNTER — Other Ambulatory Visit: Payer: Self-pay | Admitting: Nurse Practitioner

## 2023-08-18 ENCOUNTER — Encounter: Payer: Self-pay | Admitting: Nurse Practitioner

## 2023-08-19 MED ORDER — LOSARTAN POTASSIUM 100 MG PO TABS: 100.0000 mg | ORAL_TABLET | Freq: Every day | ORAL | 0 refills | Status: DC

## 2023-08-19 MED ORDER — BUSPIRONE HCL 10 MG PO TABS: 10.0000 mg | ORAL_TABLET | Freq: Two times a day (BID) | ORAL | 0 refills | Status: DC

## 2023-08-19 NOTE — Addendum Note (Signed)
 Addended by: Ralene Gasparyan T on: 08/19/2023 08:07 AM   Modules accepted: Orders

## 2023-08-20 NOTE — Telephone Encounter (Signed)
 Requested medications are due for refill today.  yes  Requested medications are on the active medications list.  yes  Last refill. 07/15/2023 #30 0 rf  Future visit scheduled.   no  Notes to clinic.  Pt already given a courtesy refill.     Requested Prescriptions  Pending Prescriptions Disp Refills   atenolol  (TENORMIN ) 50 MG tablet [Pharmacy Med Name: Atenolol  50 MG Oral Tablet] 30 tablet 0    Sig: Take 1 tablet by mouth once daily     Cardiovascular: Beta Blockers 2 Failed - 08/20/2023 11:24 AM      Failed - Last BP in normal range    BP Readings from Last 1 Encounters:  01/15/23 (!) 141/88         Failed - Valid encounter within last 6 months    Recent Outpatient Visits   None            Passed - Cr in normal range and within 360 days    Creatinine, Ser  Date Value Ref Range Status  11/07/2022 0.86 0.57 - 1.00 mg/dL Final         Passed - Last Heart Rate in normal range    Pulse Readings from Last 1 Encounters:  01/15/23 83          busPIRone  (BUSPAR ) 10 MG tablet [Pharmacy Med Name: busPIRone  HCl 10 MG Oral Tablet] 30 tablet 0    Sig: Take 1 tablet by mouth twice daily     Psychiatry: Anxiolytics/Hypnotics - Non-controlled Failed - 08/20/2023 11:24 AM      Failed - Valid encounter within last 12 months    Recent Outpatient Visits   None             losartan  (COZAAR ) 100 MG tablet [Pharmacy Med Name: Losartan  Potassium 100 MG Oral Tablet] 30 tablet 0    Sig: Take 1 tablet by mouth once daily     Cardiovascular:  Angiotensin Receptor Blockers Failed - 08/20/2023 11:24 AM      Failed - Cr in normal range and within 180 days    Creatinine, Ser  Date Value Ref Range Status  11/07/2022 0.86 0.57 - 1.00 mg/dL Final         Failed - K in normal range and within 180 days    Potassium  Date Value Ref Range Status  11/07/2022 4.5 3.5 - 5.2 mmol/L Final         Failed - Last BP in normal range    BP Readings from Last 1 Encounters:  01/15/23 (!) 141/88          Failed - Valid encounter within last 6 months    Recent Outpatient Visits   None            Passed - Patient is not pregnant

## 2023-09-12 DIAGNOSIS — E119 Type 2 diabetes mellitus without complications: Secondary | ICD-10-CM | POA: Insufficient documentation

## 2023-09-12 DIAGNOSIS — Z7985 Long-term (current) use of injectable non-insulin antidiabetic drugs: Secondary | ICD-10-CM | POA: Insufficient documentation

## 2023-09-12 NOTE — Patient Instructions (Signed)
 Be Involved in Caring For Your Health:  Taking Medications When medications are taken as directed, they can greatly improve your health. But if they are not taken as prescribed, they may not work. In some cases, not taking them correctly can be harmful. To help ensure your treatment remains effective and safe, understand your medications and how to take them. Bring your medications to each visit for review by your provider.  Your lab results, notes, and after visit summary will be available on My Chart. We strongly encourage you to use this feature. If lab results are abnormal the clinic will contact you with the appropriate steps. If the clinic does not contact you assume the results are satisfactory. You can always view your results on My Chart. If you have questions regarding your health or results, please contact the clinic during office hours. You can also ask questions on My Chart.  We at [facility] are grateful that you chose [place] to provide your care. We strive to provide evidence-based and compassionate care and are always looking for feedback. If you get a survey from the clinic please complete this so we can hear your opinions.  Diabetes Mellitus and Foot Care Diabetes, also called diabetes mellitus, may cause problems with your feet and legs because of poor blood flow (circulation). Poor circulation may make your skin: Become thinner and drier. Break more easily. Heal more slowly. Peel and crack. You may also have nerve damage (neuropathy). This can cause decreased feeling in your legs and feet. This means that you may not notice minor injuries to your feet that could lead to more serious problems. Finding and treating problems early is the best way to prevent future foot problems. How to care for your feet Foot hygiene  Wash your feet daily with warm water and mild soap. Do not use hot water. Then, pat your feet and the areas between your toes until they are fully dry. Do  not soak your feet. This can dry your skin. Trim your toenails straight across. Do not dig under them or around the cuticle. File the edges of your nails with an emery board or nail file. Apply a moisturizing lotion or petroleum jelly to the skin on your feet and to dry, brittle toenails. Use lotion that does not contain alcohol and is unscented. Do not apply lotion between your toes. Shoes and socks Wear clean socks or stockings every day. Make sure they are not too tight. Do not wear knee-high stockings. These may decrease blood flow to your legs. Wear shoes that fit well and have enough cushioning. Always look in your shoes before you put them on to be sure there are no objects inside. To break in new shoes, wear them for just a few hours a day. This prevents injuries on your feet. Wounds, scrapes, corns, and calluses  Check your feet daily for blisters, cuts, bruises, sores, and redness. If you cannot see the bottom of your feet, use a mirror or ask someone for help. Do not cut off corns or calluses or try to remove them with medicine. If you find a minor scrape, cut, or break in the skin on your feet, keep it and the skin around it clean and dry. You may clean these areas with mild soap and water. Do not clean the area with peroxide, alcohol, or iodine. If you have a wound, scrape, corn, or callus on your foot, look at it several times a day to make sure it  is healing and not infected. Check for: Redness, swelling, or pain. Fluid or blood. Warmth. Pus or a bad smell. General tips Do not cross your legs. This may decrease blood flow to your feet. Do not use heating pads or hot water bottles on your feet. They may burn your skin. If you have lost feeling in your feet or legs, you may not know this is happening until it is too late. Protect your feet from hot and cold by wearing shoes, such as at the beach or on hot pavement. Schedule a complete foot exam at least once a year or more often if  you have foot problems. Report any cuts, sores, or bruises to your health care provider right away. Where to find more information American Diabetes Association: diabetes.org Association of Diabetes Care & Education Specialists: diabeteseducator.org Contact a health care provider if: You have a condition that increases your risk of infection, and you have any cuts, sores, or bruises on your feet. You have an injury that is not healing. You have redness on your legs or feet. You feel burning or tingling in your legs or feet. You have pain or cramps in your legs and feet. Your legs or feet are numb. Your feet always feel cold. You have pain around any toenails. Get help right away if: You have a wound, scrape, corn, or callus on your foot and: You have signs of infection. You have a fever. You have a red line going up your leg. This information is not intended to replace advice given to you by your health care provider. Make sure you discuss any questions you have with your health care provider. Document Revised: 08/28/2021 Document Reviewed: 08/28/2021 Elsevier Patient Education  2024 [employer].

## 2023-09-14 ENCOUNTER — Encounter: Payer: Self-pay | Admitting: Nurse Practitioner

## 2023-09-14 ENCOUNTER — Ambulatory Visit: Admitting: Nurse Practitioner

## 2023-09-14 VITALS — BP 140/82 | HR 84 | Temp 97.9°F | Ht 62.0 in | Wt 204.8 lb

## 2023-09-14 DIAGNOSIS — Z7984 Long term (current) use of oral hypoglycemic drugs: Secondary | ICD-10-CM

## 2023-09-14 DIAGNOSIS — E669 Obesity, unspecified: Secondary | ICD-10-CM

## 2023-09-14 DIAGNOSIS — E1169 Type 2 diabetes mellitus with other specified complication: Secondary | ICD-10-CM

## 2023-09-14 DIAGNOSIS — E538 Deficiency of other specified B group vitamins: Secondary | ICD-10-CM

## 2023-09-14 DIAGNOSIS — G2581 Restless legs syndrome: Secondary | ICD-10-CM

## 2023-09-14 DIAGNOSIS — I152 Hypertension secondary to endocrine disorders: Secondary | ICD-10-CM

## 2023-09-14 DIAGNOSIS — E559 Vitamin D deficiency, unspecified: Secondary | ICD-10-CM

## 2023-09-14 DIAGNOSIS — E1159 Type 2 diabetes mellitus with other circulatory complications: Secondary | ICD-10-CM | POA: Diagnosis not present

## 2023-09-14 DIAGNOSIS — F330 Major depressive disorder, recurrent, mild: Secondary | ICD-10-CM | POA: Diagnosis not present

## 2023-09-14 DIAGNOSIS — L989 Disorder of the skin and subcutaneous tissue, unspecified: Secondary | ICD-10-CM

## 2023-09-14 DIAGNOSIS — F419 Anxiety disorder, unspecified: Secondary | ICD-10-CM

## 2023-09-14 DIAGNOSIS — F5101 Primary insomnia: Secondary | ICD-10-CM

## 2023-09-14 DIAGNOSIS — E119 Type 2 diabetes mellitus without complications: Secondary | ICD-10-CM | POA: Diagnosis not present

## 2023-09-14 LAB — BAYER DCA HB A1C WAIVED: HB A1C (BAYER DCA - WAIVED): 8 % — ABNORMAL HIGH (ref 4.8–5.6)

## 2023-09-14 LAB — MICROALBUMIN, URINE WAIVED
Creatinine, Urine Waived: 50 mg/dL (ref 10–300)
Microalb, Ur Waived: 30 mg/L — ABNORMAL HIGH (ref 0–19)
Microalb/Creat Ratio: >300 mg/g — ABNORMAL HIGH (ref <30)

## 2023-09-14 MED ORDER — ROSUVASTATIN CALCIUM 40 MG PO TABS: 40.0000 mg | ORAL_TABLET | Freq: Every day | ORAL | 2 refills | Status: DC

## 2023-09-14 MED ORDER — FENOFIBRATE 145 MG PO TABS: 145.0000 mg | ORAL_TABLET | Freq: Every day | ORAL | 2 refills | Status: AC

## 2023-09-14 MED ORDER — EZETIMIBE 10 MG PO TABS
10.0000 mg | ORAL_TABLET | Freq: Every day | ORAL | 2 refills | Status: DC
Start: 2023-09-14 — End: 2023-09-18

## 2023-09-14 MED ORDER — CITALOPRAM HYDROBROMIDE 20 MG PO TABS: 20.0000 mg | ORAL_TABLET | Freq: Every day | ORAL | 1 refills | Status: DC

## 2023-09-14 MED ORDER — METFORMIN HCL 1000 MG PO TABS: 1000.0000 mg | ORAL_TABLET | Freq: Two times a day (BID) | ORAL | 2 refills | Status: AC

## 2023-09-14 MED ORDER — GLIPIZIDE 5 MG PO TABS: 5.0000 mg | ORAL_TABLET | Freq: Two times a day (BID) | ORAL | 2 refills | Status: AC

## 2023-09-14 MED ORDER — BUSPIRONE HCL 10 MG PO TABS: 10.0000 mg | ORAL_TABLET | Freq: Two times a day (BID) | ORAL | 1 refills | Status: DC

## 2023-09-14 MED ORDER — CHOLECALCIFEROL 1.25 MG (50000 UT) PO TABS: 1.0000 | ORAL_TABLET | ORAL | 4 refills | Status: AC

## 2023-09-14 MED ORDER — LOSARTAN POTASSIUM 100 MG PO TABS: 100.0000 mg | ORAL_TABLET | Freq: Every day | ORAL | 2 refills | Status: AC

## 2023-09-14 MED ORDER — ATENOLOL 50 MG PO TABS: 50.0000 mg | ORAL_TABLET | Freq: Every day | ORAL | 1 refills | Status: DC

## 2023-09-14 NOTE — Assessment & Plan Note (Signed)
 Chronic, stable without medication.  Monitor and start treatment as needed.  Labs today.

## 2023-09-14 NOTE — Assessment & Plan Note (Signed)
 Refer to depression plan of care.

## 2023-09-14 NOTE — Assessment & Plan Note (Addendum)
 Chronic, ongoing.  BP remains is above goal today due to not taking Atenolol .  Will continue Losartan  100 MG and restart Atenolol  (refills sent).  Discussed at length with patient. Recommend she monitor BP at least a few mornings a week at home and document. DASH diet at home. Continue current medication regimen and adjust as needed. Labs today: CBC, TSH, CMP.  Consider Amlodipine  if ongoing elevations next visit.  Urine ALB 07 October 2023, continue ARB for protection.

## 2023-09-14 NOTE — Assessment & Plan Note (Signed)
 Chronic.  Level was 8 in April 2024, recommend she continue weekly dosing.  Check level today.

## 2023-09-14 NOTE — Assessment & Plan Note (Signed)
 Refer to diabetes with obesity plan of care.

## 2023-09-14 NOTE — Progress Notes (Signed)
 BP (!) 140/82 (BP Location: Left Arm, Patient Position: Sitting, Cuff Size: Large)   Pulse 84   Temp 97.9 F (36.6 C) (Oral)   Ht 5' 2 (1.575 m)   Wt 204 lb 12.8 oz (92.9 kg)   LMP  (LMP Unknown)   SpO2 98%   BMI 37.46 kg/m    Subjective:    Patient ID: Holly Norman, female    DOB: 1971-01-07, 53 y.o.   MRN: [id number]  HPI: Holly Norman is a 53 y.o. female  Chief Complaint  Patient presents with   Depression   Diabetes   Hyperlipidemia   Hypertension   Nevus    Patient states she has a mole on her L shoulder blade that she would like looked at    Lost to follow-up since August 2024. 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  DIABETES August 2024 A1c 7.6%.  Currently not taking any of her diabetes medications, has not taken Glipizide  or Metformin  in awhile.  Trulicity  hurt to inject in past and made her stomach hurt, vomiting so bad she thought she may have to go to ER.  Has Gabapentin  for RLS, but is not taking as did not help sleep or discomfort.  Is taking Fish Oil, not taking Vitamin B12. Hypoglycemic episodes:no Polydipsia/polyuria: no Visual disturbance: no Chest pain: no Paresthesias: no Glucose Monitoring: yes  Accucheck frequency: 1-2 times a week 140 to 180 range  Fasting glucose:  Post prandial:  Evening:  Before meals: Taking Insulin?: no  Long acting insulin:  Short acting insulin: Blood Pressure Monitoring: occasionally Retinal Examination: Up to Date -- [facility] Foot Exam: Up to Date Pneumovax: refuses Influenza: refuses Aspirin: no   HYPERTENSION / HYPERLIPIDEMIA Continues Losartan . No taken Rosuvastatin , Zetia  + Fenofibrate  for two weeks. Has been out of Atenolol  for 2 weeks. Satisfied with current treatment? yes Duration of hypertension: chronic BP monitoring frequency: not checking BP range:  BP medication side effects: no Duration of hyperlipidemia: chronic Cholesterol  medication side effects: no Cholesterol supplements: none Medication compliance: good compliance Aspirin: no Recent stressors: no Recurrent headaches: no Visual changes: no Palpitations: no Dyspnea: no Chest pain: no Lower extremity edema: no Dizzy/lightheaded: no   DEPRESSION Continues on Buspar  10 MG BID (often takes once a day), has not been taking Celexa  as did not realize she could take this with Buspar . Takes Vitamin D  supplement on occasion due to history of low level. Mood status: stable Satisfied with current treatment?: yes Symptom severity: moderate  Duration of current treatment : chronic Side effects: no Medication compliance: good compliance Psychotherapy/counseling: yes in past Previous psychiatric medications: Celexa  Depressed mood: yes, mild Anxious mood: yes  Anhedonia: no Significant weight loss or gain: no Insomnia: yes hard to stay asleep -- taking Delta 9 gummies which offer benefit, tried Gabapentin  but this did not work Fatigue: on occasion Feelings of worthlessness or guilt: no Impaired concentration/indecisiveness: yes Suicidal ideations: no Hopelessness: no Crying spells: no    09/14/2023   10:36 AM 11/07/2022    1:30 PM 05/21/2022   10:19 AM 04/24/2022   11:15 AM 07/25/2021    4:29 PM  Depression screen PHQ 2/9  Decreased Interest 0 0 0 0 0  Down, Depressed, Hopeless 0 0 0 0 0  PHQ - 2 Score 0 0 0 0 0  Altered sleeping 3 3 3 3 3   Tired, decreased energy 1 0 1 1 1   Change  in appetite 0 0 0 0 0  Feeling bad or failure about yourself  0 0 0 0 0  Trouble concentrating 3 3 3 3 3   Moving slowly or fidgety/restless 0 0 0 0 2  Suicidal thoughts 0 0 0 0 0  PHQ-9 Score 7 6 7 7 9   Difficult doing work/chores Very difficult Not difficult at all Somewhat difficult  Very difficult       09/14/2023   10:38 AM 11/07/2022    1:30 PM 05/21/2022   10:19 AM 04/24/2022   11:15 AM  GAD 7 : Generalized Anxiety Score  Nervous, Anxious, on Edge 1 1 3 3    Control/stop worrying 3 2 2 3   Worry too much - different things 2 2 2 3   Trouble relaxing 3 3 3 3   Restless 3 3 3 3   Easily annoyed or irritable 1 2 3 3   Afraid - awful might happen 0 0 0 0  Total GAD 7 Score 13 13 16 18   Anxiety Difficulty Somewhat difficult Not difficult at all Somewhat difficult    Relevant past medical, surgical, family and social history reviewed and updated as indicated. Interim medical history since our last visit reviewed. Allergies and medications reviewed and updated.  Review of Systems  Constitutional:  Negative for activity change, appetite change, diaphoresis, fatigue and fever.  Respiratory:  Negative for cough, chest tightness and shortness of breath.   Cardiovascular:  Negative for chest pain, palpitations and leg swelling.  Gastrointestinal: Negative.   Endocrine: Negative for polydipsia, polyphagia and polyuria.  Neurological: Negative.   Psychiatric/Behavioral: Negative.     Per HPI unless specifically indicated above     Objective:    BP (!) 140/82 (BP Location: Left Arm, Patient Position: Sitting, Cuff Size: Large)   Pulse 84   Temp 97.9 F (36.6 C) (Oral)   Ht 5' 2 (1.575 m)   Wt 204 lb 12.8 oz (92.9 kg)   LMP  (LMP Unknown)   SpO2 98%   BMI 37.46 kg/m   Wt Readings from Last 3 Encounters:  09/14/23 204 lb 12.8 oz (92.9 kg)  01/15/23 202 lb (91.6 kg)  12/11/22 206 lb 6.4 oz (93.6 kg)    Physical Exam Vitals and nursing note reviewed.  Constitutional:      General: She is awake. She is not in acute distress.    Appearance: She is well-developed and well-groomed. She is obese. She is not ill-appearing or toxic-appearing.  HENT:     Head: Normocephalic.     Right Ear: Hearing and external ear normal.     Left Ear: Hearing and external ear normal.  Eyes:     General: Lids are normal.        Right eye: No discharge.        Left eye: No discharge.     Conjunctiva/sclera: Conjunctivae normal.     Pupils: Pupils are equal,  round, and reactive to light.  Neck:     Thyroid: No thyromegaly.     Vascular: No carotid bruit.  Cardiovascular:     Rate and Rhythm: Normal rate and regular rhythm.     Heart sounds: Normal heart sounds. No murmur heard.    No gallop.  Pulmonary:     Effort: Pulmonary effort is normal. No accessory muscle usage or respiratory distress.     Breath sounds: Normal breath sounds.  Abdominal:     General: Bowel sounds are normal. There is no distension.     Palpations:  Abdomen is soft.     Tenderness: There is no abdominal tenderness.  Musculoskeletal:     Cervical back: Normal range of motion and neck supple.     Right lower leg: No edema.     Left lower leg: No edema.  Lymphadenopathy:     Cervical: No cervical adenopathy.  Skin:    General: Skin is warm and dry.     Findings: Lesion present.      Neurological:     Mental Status: She is alert and oriented to person, place, and time.     Deep Tendon Reflexes: Reflexes are normal and symmetric.     Reflex Scores:      Brachioradialis reflexes are 2+ on the right side and 2+ on the left side.      Patellar reflexes are 2+ on the right side and 2+ on the left side. Psychiatric:        Attention and Perception: Attention normal.        Mood and Affect: Mood normal.        Speech: Speech normal.        Behavior: Behavior normal. Behavior is cooperative.        Thought Content: Thought content normal.       Diabetic Foot Exam - Simple   Simple Foot Form Visual Inspection No deformities, no ulcerations, no other skin breakdown bilaterally: Yes Sensation Testing Intact to touch and monofilament testing bilaterally: Yes Pulse Check Posterior Tibialis and Dorsalis pulse intact bilaterally: Yes Comments     Results for orders placed or performed in visit on 04/14/23  HM DIABETES EYE EXAM   Collection Time: 04/13/23  7:48 AM  Result Value Ref Range   [facility] Diabetic Eye Exam No Retinopathy No Retinopathy      Assessment &  Plan:   Problem List Items Addressed This Visit       Cardiovascular and Mediastinum   Hypertension associated with diabetes (HCC)   Chronic, ongoing.  BP remains is above goal today due to not taking Atenolol .  Will continue Losartan  100 MG and restart Atenolol  (refills sent).  Discussed at length with patient. Recommend she monitor BP at least a few mornings a week at home and document. DASH diet at home. Continue current medication regimen and adjust as needed. Labs today: CBC, TSH, CMP.  Consider Amlodipine  if ongoing elevations next visit.  Urine ALB 07 October 2023, continue ARB for protection.         Relevant Medications   atenolol  (TENORMIN ) 50 MG tablet   ezetimibe  (ZETIA ) 10 MG tablet   fenofibrate  (TRICOR ) 145 MG tablet   glipiZIDE  (GLUCOTROL ) 5 MG tablet   losartan  (COZAAR ) 100 MG tablet   metFORMIN  (GLUCOPHAGE ) 1000 MG tablet   rosuvastatin  (CRESTOR ) 40 MG tablet   Other Relevant Orders   Bayer DCA Hb A1c Waived   Microalbumin, Urine Waived   CBC with Differential/Platelet   Comprehensive metabolic panel with GFR   TSH     Endocrine   Type 2 diabetes mellitus with obesity (HCC) - Primary   Chronic, ongoing.  A1c 8% upward trend from previous as has not taken any medication in months due to insurance.  Urine ALB 07 October 2023, remain with ARB on board for kidney protection.  Restart Metformin  and Glipizide , refills sent in.  Does not tolerate Trulicity .  Recommend she check fasting sugars daily and document for visits.   - Eye and foot exam up to date - On ARB and  statin therapy - Refuses PCV20 and flu      Relevant Medications   glipiZIDE  (GLUCOTROL ) 5 MG tablet   losartan  (COZAAR ) 100 MG tablet   metFORMIN  (GLUCOPHAGE ) 1000 MG tablet   rosuvastatin  (CRESTOR ) 40 MG tablet   Other Relevant Orders   Bayer DCA Hb A1c Waived   Microalbumin, Urine Waived   Hyperlipidemia associated with type 2 diabetes mellitus (HCC)   Chronic, ongoing.  Continue current medication  regimen and adjust as needed.  Lipid panel today.      Relevant Medications   atenolol  (TENORMIN ) 50 MG tablet   ezetimibe  (ZETIA ) 10 MG tablet   fenofibrate  (TRICOR ) 145 MG tablet   glipiZIDE  (GLUCOTROL ) 5 MG tablet   losartan  (COZAAR ) 100 MG tablet   metFORMIN  (GLUCOPHAGE ) 1000 MG tablet   rosuvastatin  (CRESTOR ) 40 MG tablet   Other Relevant Orders   Bayer DCA Hb A1c Waived   Lipid Panel w/o Chol/HDL Ratio   Diabetes mellitus treated with oral medication (HCC)   Refer to diabetes with obesity plan of care.      Relevant Medications   glipiZIDE  (GLUCOTROL ) 5 MG tablet   losartan  (COZAAR ) 100 MG tablet   metFORMIN  (GLUCOPHAGE ) 1000 MG tablet   rosuvastatin  (CRESTOR ) 40 MG tablet     Other   Vitamin D  deficiency   Chronic.  Level was 8 in April 2024, recommend she continue weekly dosing.  Check level today.      Relevant Orders   VITAMIN D  25 Hydroxy (Vit-D Deficiency, Fractures)   Vitamin B12 deficiency   Chronic, noted past labs.  Recommend she take 1000 MCG B12 daily OTC supplement.  This may benefit energy and RLS too.        Relevant Orders   CBC with Differential/Platelet   Vitamin B12   Restless legs   Chronic, stable without medication.  Monitor and start treatment as needed.  Labs today.      Relevant Orders   CBC with Differential/Platelet   Comprehensive metabolic panel with GFR   TSH   Iron   Ferritin   Magnesium   Depression   Chronic, exacerbated as has not been taking Celexa  with her Buspar .  Denies SI/HI. Restart Celexa  20 MG daily and continue Buspar  10 MG daily in morning (does not do well with BID dosing).  Educated her on medications.  Recommend meditation and relaxation techniques.  At this time she reports stable, baseline mood.      Relevant Medications   busPIRone  (BUSPAR ) 10 MG tablet   citalopram  (CELEXA ) 20 MG tablet   Anxiety   Refer to depression plan of care.      Relevant Medications   busPIRone  (BUSPAR ) 10 MG tablet    citalopram  (CELEXA ) 20 MG tablet   Other Visit Diagnoses       Skin lesion       To left posterior shoulder, suspicious in nature.  Referral to dermatology placed.   Relevant Orders   Ambulatory referral to Dermatology        Follow up plan: Return in about 6 weeks (around 10/26/2023) for T2DM, HTN -- restarted medications.

## 2023-09-14 NOTE — Assessment & Plan Note (Signed)
 Chronic, exacerbated as has not been taking Celexa  with her Buspar .  Denies SI/HI. Restart Celexa  20 MG daily and continue Buspar  10 MG daily in morning (does not do well with BID dosing).  Educated her on medications.  Recommend meditation and relaxation techniques.  At this time she reports stable, baseline mood.

## 2023-09-14 NOTE — Assessment & Plan Note (Signed)
 Chronic, ongoing.  A1c 8% upward trend from previous as has not taken any medication in months due to insurance.  Urine ALB 07 October 2023, remain with ARB on board for kidney protection.  Restart Metformin  and Glipizide , refills sent in.  Does not tolerate Trulicity .  Recommend she check fasting sugars daily and document for visits.   - Eye and foot exam up to date - On ARB and statin therapy - Refuses PCV20 and flu

## 2023-09-14 NOTE — Assessment & Plan Note (Signed)
 Chronic, ongoing.  Continue current medication regimen and adjust as needed. Lipid panel today.

## 2023-09-14 NOTE — Assessment & Plan Note (Signed)
 Chronic, noted past labs.  Recommend she take 1000 MCG B12 daily OTC supplement.  This may benefit energy and RLS too.

## 2023-09-15 ENCOUNTER — Ambulatory Visit: Payer: Self-pay | Admitting: Nurse Practitioner

## 2023-09-15 LAB — COMPREHENSIVE METABOLIC PANEL WITH GFR
ALT: 21 IU/L (ref 0–32)
AST: 21 IU/L (ref 0–40)
Albumin: 4.4 g/dL (ref 3.8–4.9)
Alkaline Phosphatase: 76 IU/L (ref 44–121)
BUN/Creatinine Ratio: 15 (ref 9–23)
BUN: 12 mg/dL (ref 6–24)
Bilirubin Total: 0.7 mg/dL (ref 0.0–1.2)
CO2: 23 mmol/L (ref 20–29)
Calcium: 9.6 mg/dL (ref 8.7–10.2)
Chloride: 103 mmol/L (ref 96–106)
Creatinine, Ser: 0.81 mg/dL (ref 0.57–1.00)
Globulin, Total: 2.7 g/dL (ref 1.5–4.5)
Glucose: 151 mg/dL — ABNORMAL HIGH (ref 70–99)
Potassium: 4.9 mmol/L (ref 3.5–5.2)
Sodium: 139 mmol/L (ref 134–144)
Total Protein: 7.1 g/dL (ref 6.0–8.5)
eGFR: 87 mL/min/1.73 (ref >59)

## 2023-09-15 LAB — FERRITIN: Ferritin: 56 ng/mL (ref 15–150)

## 2023-09-15 LAB — CBC WITH DIFFERENTIAL/PLATELET
Basophils Absolute: 0.1 x10E3/uL (ref 0.0–0.2)
Basos: 1 %
EOS (ABSOLUTE): 0.2 x10E3/uL (ref 0.0–0.4)
Eos: 3 %
Hematocrit: 45 % (ref 34.0–46.6)
Hemoglobin: 14.9 g/dL (ref 11.1–15.9)
Immature Grans (Abs): 0 x10E3/uL (ref 0.0–0.1)
Immature Granulocytes: 0 %
Lymphocytes Absolute: 1.5 x10E3/uL (ref 0.7–3.1)
Lymphs: 29 %
MCH: 31.7 pg (ref 26.6–33.0)
MCHC: 33.1 g/dL (ref 31.5–35.7)
MCV: 96 fL (ref 79–97)
Monocytes Absolute: 0.3 x10E3/uL (ref 0.1–0.9)
Monocytes: 7 %
Neutrophils Absolute: 3.1 x10E3/uL (ref 1.4–7.0)
Neutrophils: 60 %
Platelets: 225 x10E3/uL (ref 150–450)
RBC: 4.7 x10E6/uL (ref 3.77–5.28)
RDW: 12.5 % (ref 11.7–15.4)
WBC: 5.1 x10E3/uL (ref 3.4–10.8)

## 2023-09-15 LAB — LIPID PANEL W/O CHOL/HDL RATIO
Cholesterol, Total: 304 mg/dL — ABNORMAL HIGH (ref 100–199)
HDL: 46 mg/dL (ref >39)
LDL Chol Calc (NIH): 195 mg/dL — ABNORMAL HIGH (ref 0–99)
Triglycerides: 314 mg/dL — ABNORMAL HIGH (ref 0–149)
VLDL Cholesterol Cal: 63 mg/dL — ABNORMAL HIGH (ref 5–40)

## 2023-09-15 LAB — VITAMIN B12: Vitamin B-12: 209 pg/mL — ABNORMAL LOW (ref 232–1245)

## 2023-09-15 LAB — IRON: Iron: 82 ug/dL (ref 27–159)

## 2023-09-15 LAB — MAGNESIUM: Magnesium: 1.9 mg/dL (ref 1.6–2.3)

## 2023-09-15 LAB — VITAMIN D 25 HYDROXY (VIT D DEFICIENCY, FRACTURES): Vit D, 25-Hydroxy: 23.3 ng/mL — ABNORMAL LOW (ref 30.0–100.0)

## 2023-09-15 LAB — TSH: TSH: 2.14 u[IU]/mL (ref 0.450–4.500)

## 2023-09-18 ENCOUNTER — Encounter: Payer: Self-pay | Admitting: Nurse Practitioner

## 2023-09-18 MED ORDER — ATORVASTATIN CALCIUM 40 MG PO TABS
40.0000 mg | ORAL_TABLET | Freq: Every day | ORAL | 3 refills | Status: DC
Start: 2023-09-18 — End: 2024-01-31

## 2023-09-18 MED ORDER — CITALOPRAM HYDROBROMIDE 20 MG PO TABS: 20.0000 mg | ORAL_TABLET | Freq: Every day | ORAL | 1 refills | Status: AC

## 2023-09-18 MED ORDER — ATENOLOL 50 MG PO TABS: 50.0000 mg | ORAL_TABLET | Freq: Every day | ORAL | 1 refills | Status: AC

## 2023-09-18 NOTE — Addendum Note (Signed)
 Addended by: Dusty Raczkowski T on: 09/18/2023 03:22 PM   Modules accepted: Orders

## 2023-11-02 ENCOUNTER — Ambulatory Visit: Admitting: Nurse Practitioner

## 2023-11-21 IMAGING — DX DG FOREARM 2V*R*
2 series · 2 of 2 positions shown · non-contrast
Comparison: None

CLINICAL DATA: Foreign body concern following dog bite.

EXAM:
RIGHT FOREARM - 2 VIEW

[forearm ap]
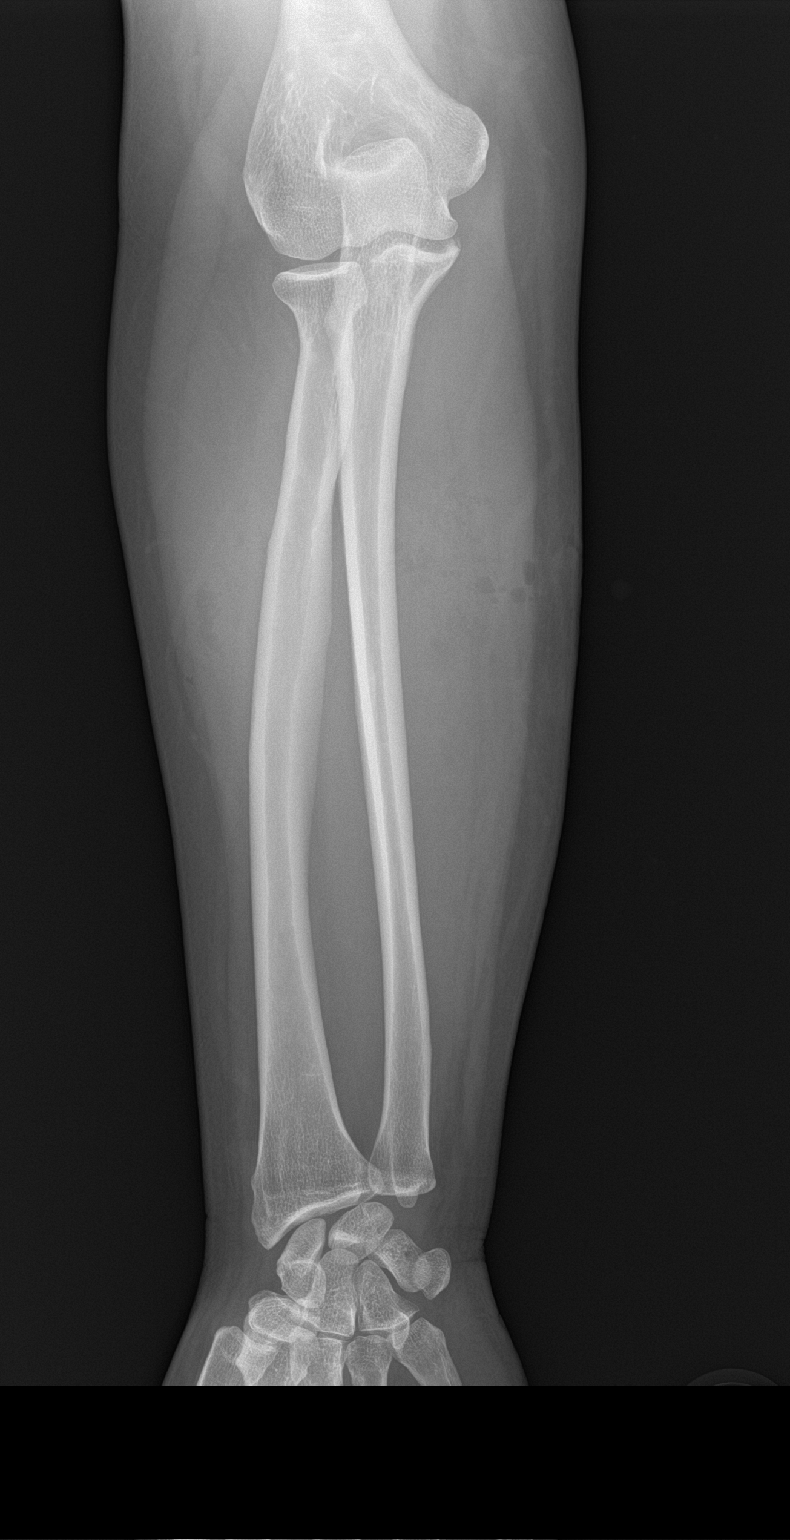

[forearm lat]
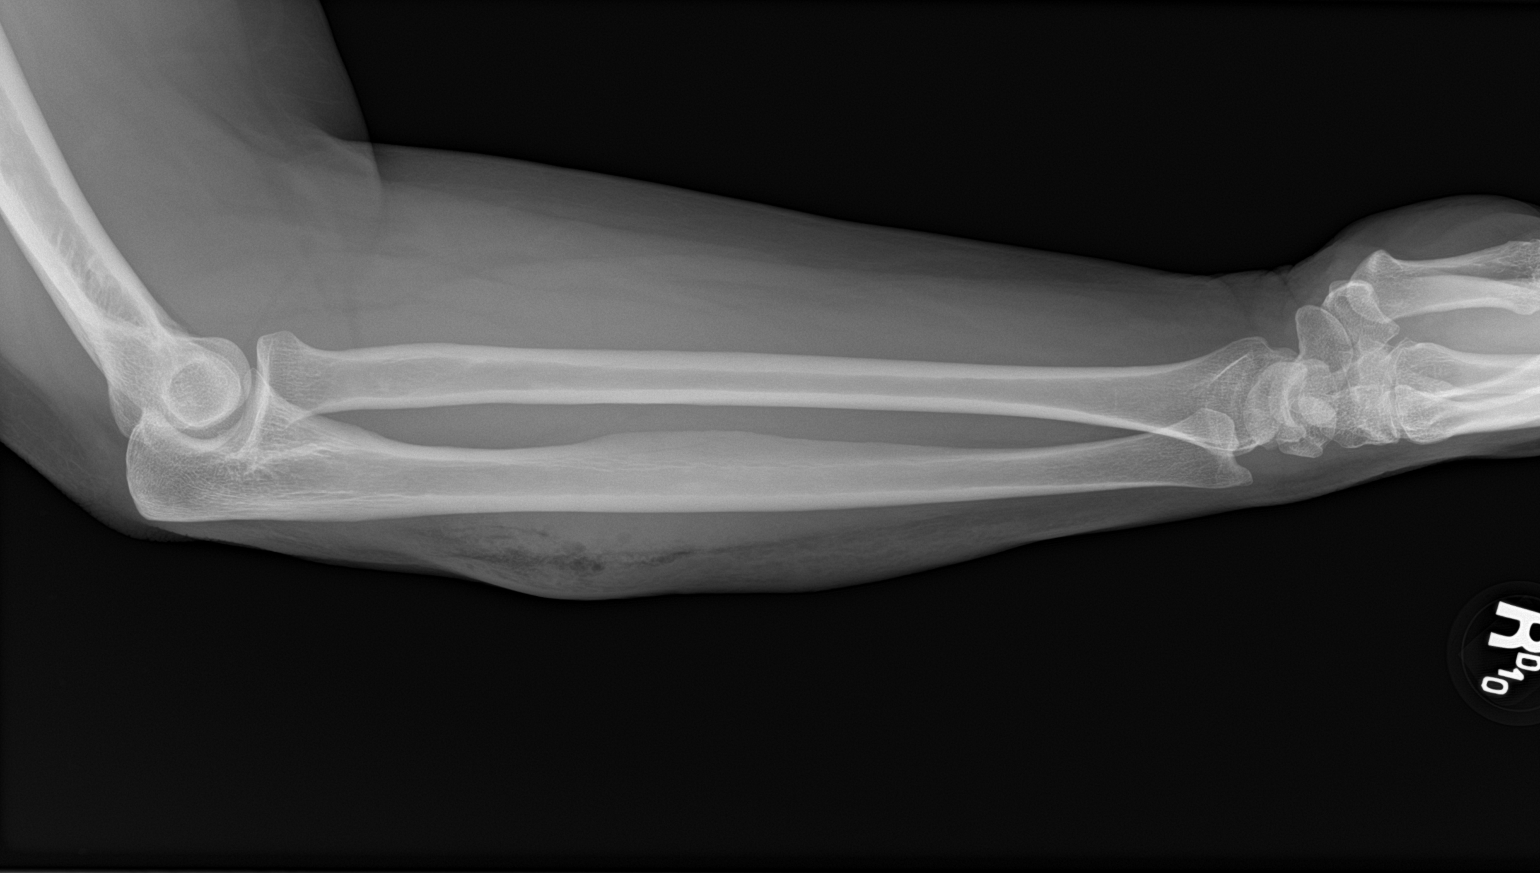

[2 of 2 positions shown; findings below may reference images not displayed]

FINDINGS: Gas in the soft tissues of the dorsal mid forearm tracking from the
proximal third along the middle third of the forearm, associated
with soft tissue edema. No signs of radiopaque foreign body in the
soft tissues. No sign of fracture or dislocation.
IMPRESSION: Soft tissue injury without signs of fracture or dislocation.

No radiopaque foreign body.

## 2023-11-29 NOTE — Patient Instructions (Incomplete)

## 2023-12-01 ENCOUNTER — Ambulatory Visit: Admitting: Nurse Practitioner

## 2023-12-15 ENCOUNTER — Telehealth: Admitting: Family Medicine

## 2023-12-15 ENCOUNTER — Encounter: Payer: Self-pay | Admitting: Nurse Practitioner

## 2023-12-15 DIAGNOSIS — J069 Acute upper respiratory infection, unspecified: Secondary | ICD-10-CM

## 2023-12-15 MED ORDER — FLUTICASONE PROPIONATE 50 MCG/ACT NA SUSP: 2.0000 | Freq: Every day | NASAL | 0 refills | Status: AC

## 2023-12-15 NOTE — Progress Notes (Signed)
 We are sorry you are not feeling well.  Here is how we plan to help!  Based on what you have shared with me, it looks like you may have a viral upper respiratory infection.  Upper respiratory infections are caused by a large number of viruses; however, rhinovirus is the most common cause.   Symptoms vary from person to person, with common symptoms including sore throat, cough, and fatigue or lack of energy.  A low-grade fever of up to 100.4 may present, but is often uncommon.  Symptoms vary however, and are closely related to a person's age or underlying illnesses.  The most common symptoms associated with an upper respiratory infection are nasal discharge or congestion, cough, sneezing, headache and pressure in the ears and face.  These symptoms usually persist for about 3 to 10 days, but can last up to 2 weeks.  It is important to know that upper respiratory infections do not cause serious illness or complications in most cases.    Upper respiratory infections can be transmitted from person to person, with the most common method of transmission being a person's hands.  The virus is able to live on the skin and can infect other persons for up to 2 hours after direct contact.  Also, these can be transmitted when someone coughs or sneezes; thus, it is important to cover the mouth to reduce this risk.  To keep the spread of the illness at bay, good hand hygiene is very important.  This is an infection that is most likely caused by a virus. There are no specific treatments other than to help you with the symptoms until the infection runs its course.  We are sorry you are not feeling well.  Here is how we plan to help!   For nasal congestion, you may use an oral decongestants such as Mucinex  D or if you have glaucoma or high blood pressure use plain Mucinex .  Saline nasal spray or nasal drops can help and can safely be used as often as needed for congestion.  For your congestion, I have prescribed Fluticasone  nasal spray one spray in each nostril twice a day  If you do not have a history of heart disease, hypertension, diabetes or thyroid disease, prostate/bladder issues or glaucoma, you may also use Sudafed to treat nasal congestion.  It is highly recommended that you consult with a pharmacist or your primary care physician to ensure this medication is safe for you to take.     If you have a cough, you may use cough suppressants such as Delsym and Robitussin.  If you have glaucoma or high blood pressure, you can also use Coricidin HBP.     If you have a sore or scratchy throat, use a saltwater gargle-  to  teaspoon of salt dissolved in a 4-ounce to 8-ounce glass of warm water.  Gargle the solution for approximately 15-30 seconds and then spit.  It is important not to swallow the solution.  You can also use throat lozenges/cough drops and Chloraseptic spray to help with throat pain or discomfort.  Warm or cold liquids can also be helpful in relieving throat pain.  For headache, pain or general discomfort, you can use Ibuprofen or Tylenol  as directed.   Some authorities believe that zinc sprays or the use of Echinacea may shorten the course of your symptoms.   HOME CARE Only take medications as instructed by your medical team. Be sure to drink plenty of fluids. Water is fine  as well as fruit juices, sodas and electrolyte beverages. You may want to stay away from caffeine or alcohol. If you are nauseated, try taking small sips of liquids. How do you know if you are getting enough fluid? Your urine should be a pale yellow or almost colorless. Get rest. Taking a steamy shower or using a humidifier may help nasal congestion and ease sore throat pain. You can place a towel over your head and breathe in the steam from hot water coming from a faucet. Using a saline nasal spray works much the same way. Cough drops, hard candies and sore throat lozenges may ease your cough. Avoid close contacts especially the  very young and the elderly Cover your mouth if you cough or sneeze Always remember to wash your hands.   GET HELP RIGHT AWAY IF: You develop worsening fever. If your symptoms do not improve within 10 days You develop yellow or green discharge from your nose over 3 days. You have coughing fits You develop a severe head ache or visual changes. You develop shortness of breath, difficulty breathing or start having chest pain Your symptoms persist after you have completed your treatment plan  MAKE SURE YOU  Understand these instructions. Will watch your condition. Will get help right away if you are not doing well or get worse.  Your e-visit answers were reviewed by a board certified advanced clinical practitioner to complete your personal care plan. Depending upon the condition, your plan could have included both over the counter or prescription medications. Please review your pharmacy choice. If there is a problem, you may call our nursing hot line at and have the prescription routed to another pharmacy. Your safety is important to us . If you have drug allergies check your prescription carefully.   You can use MyChart to ask questions about today's visit, request a non-urgent call back, or ask for a work or school excuse for 24 hours related to this e-Visit. If it has been greater than 24 hours you will need to follow up with your provider, or enter a new e-Visit to address those concerns. You will get an e-mail in the next two days asking about your experience.  I hope that your e-visit has been valuable and will speed your recovery. Thank you for using e-visits.   I have spent 5 minutes in review of e-visit questionnaire, review and updating patient chart, medical decision making and response to patient.   Chiquita CHRISTELLA Barefoot, NP

## 2023-12-16 MED ORDER — AMOXICILLIN-POT CLAVULANATE 875-125 MG PO TABS: 1.0000 | ORAL_TABLET | Freq: Two times a day (BID) | ORAL | 0 refills | Status: AC

## 2023-12-28 ENCOUNTER — Ambulatory Visit: Admitting: Nurse Practitioner

## 2024-01-31 NOTE — Patient Instructions (Incomplete)
Please call to schedule your mammogram and/or bone density: [facility] at [facility]  Address: [address], [place], [place] [address] Phone: [phone]  [facility] [address] [place],  [place]  [address] Phone: [phone]   Heart-Healthy Eating Plan Many factors influence your heart health, including eating and exercise habits. Heart health is also called coronary health. Coronary risk increases with abnormal blood fat (lipid) levels. A heart-healthy eating plan includes limiting unhealthy fats, increasing healthy fats, limiting salt (sodium) intake, and making other diet and lifestyle changes. What is my plan? Your health care provider may recommend that: You limit your fat intake to _________% or less of your total calories each day. You limit your saturated fat intake to _________% or less of your total calories each day. You limit the amount of cholesterol in your diet to less than _________ mg per day. You limit the amount of sodium in your diet to less than _________ mg per day. What are tips for following this plan? Cooking Cook foods using methods other than frying. Baking, boiling, grilling, and broiling are all good options. Other ways to reduce fat include: Removing the skin from poultry. Removing all visible fats from meats. Steaming vegetables in water or broth. Meal planning  At meals, imagine dividing your plate into fourths: Fill one-half of your plate with vegetables and green salads. Fill one-fourth of your plate with whole grains. Fill one-fourth of your plate with lean protein foods. Eat 2-4 cups of vegetables per day. One cup of vegetables equals 1 cup (91 g) broccoli or cauliflower florets, 2 medium carrots, 1 large bell pepper, 1 large sweet potato, 1 large tomato, 1 medium white potato, 2 cups (150 g) raw leafy greens. Eat 1-2 cups of fruit per day. One cup of fruit equals 1 small  apple, 1 large banana, 1 cup (237 g) mixed fruit, 1 large orange,  cup (82 g) dried fruit, 1 cup (240 mL) 100% fruit juice. Eat more foods that contain soluble fiber. Examples include apples, broccoli, carrots, beans, peas, and barley. Aim to get 25-30 g of fiber per day. Increase your consumption of legumes, nuts, and seeds to 4-5 servings per week. One serving of dried beans or legumes equals  cup (90 g) cooked, 1 serving of nuts is  oz (12 almonds, 24 pistachios, or 7 walnut halves), and 1 serving of seeds equals  oz (8 g). Fats Choose healthy fats more often. Choose monounsaturated and polyunsaturated fats, such as olive and canola oils, avocado oil, flaxseeds, walnuts, almonds, and seeds. Eat more omega-3 fats. Choose salmon, mackerel, sardines, tuna, flaxseed oil, and ground flaxseeds. Aim to eat fish at least 2 times each week. Check food labels carefully to identify foods with trans fats or high amounts of saturated fat. Limit saturated fats. These are found in animal products, such as meats, butter, and cream. Plant sources of saturated fats include palm oil, palm kernel oil, and coconut oil. Avoid foods with partially hydrogenated oils in them. These contain trans fats. Examples are stick margarine, some tub margarines, cookies, crackers, and other baked goods. Avoid fried foods. General information Eat more home-cooked food and less restaurant, buffet, and fast food. Limit or avoid alcohol. Limit foods that are high in added sugar and simple starches such as foods made using white refined flour (white breads, pastries, sweets). Lose weight if you are overweight. Losing just 5-10% of your body weight can help your  overall health and prevent diseases such as diabetes and heart disease. Monitor your sodium intake, especially if you have high blood pressure. Talk with your health care provider about your sodium intake. Try to incorporate more vegetarian meals weekly. What foods should I  eat? Fruits All fresh, canned (in natural juice), or frozen fruits. Vegetables Fresh or frozen vegetables (raw, steamed, roasted, or grilled). Green salads. Grains Most grains. Choose whole wheat and whole grains most of the time. Rice and pasta, including brown rice and pastas made with whole wheat. Meats and other proteins Lean, well-trimmed beef, veal, pork, and lamb. Chicken and [place] without skin. All fish and shellfish. Wild duck, rabbit, pheasant, and venison. Egg whites or low-cholesterol egg substitutes. Dried beans, peas, lentils, and tofu. Seeds and most nuts. Dairy Low-fat or nonfat cheeses, including ricotta and mozzarella. Skim or 1% milk (liquid, powdered, or evaporated). Buttermilk made with low-fat milk. Nonfat or low-fat yogurt. Fats and oils Non-hydrogenated (trans-free) margarines. Vegetable oils, including soybean, sesame, sunflower, olive, avocado, peanut, safflower, corn, canola, and cottonseed. Salad dressings or mayonnaise made with a vegetable oil. Beverages Water (mineral or sparkling). Coffee and tea. Unsweetened ice tea. Diet beverages. Sweets and desserts Sherbet, gelatin, and fruit ice. Small amounts of dark chocolate. Limit all sweets and desserts. Seasonings and condiments All seasonings and condiments. The items listed above may not be a complete list of foods and beverages you can eat. Contact a dietitian for more options. What foods should I avoid? Fruits Canned fruit in heavy syrup. Fruit in cream or butter sauce. Fried fruit. Limit coconut. Vegetables Vegetables cooked in cheese, cream, or butter sauce. Fried vegetables. Grains Breads made with saturated or trans fats, oils, or whole milk. Croissants. Sweet rolls. Donuts. High-fat crackers, such as cheese crackers and chips. Meats and other proteins Fatty meats, such as hot dogs, ribs, sausage, bacon, rib-eye roast or steak. High-fat deli meats, such as salami and bologna. Caviar. Domestic duck and  goose. Organ meats, such as liver. Dairy Cream, sour cream, cream cheese, and creamed cottage cheese. Whole-milk cheeses. Whole or 2% milk (liquid, evaporated, or condensed). Whole buttermilk. Cream sauce or high-fat cheese sauce. Whole-milk yogurt. Fats and oils Meat fat, or shortening. Cocoa butter, hydrogenated oils, palm oil, coconut oil, palm kernel oil. Solid fats and shortenings, including bacon fat, salt pork, lard, and butter. Nondairy cream substitutes. Salad dressings with cheese or sour cream. Beverages Regular sodas and any drinks with added sugar. Sweets and desserts Frosting. Pudding. Cookies. Cakes. Pies. Milk chocolate or white chocolate. Buttered syrups. Full-fat ice cream or ice cream drinks. The items listed above may not be a complete list of foods and beverages to avoid. Contact a dietitian for more information. Summary Heart-healthy meal planning includes limiting unhealthy fats, increasing healthy fats, limiting salt (sodium) intake and making other diet and lifestyle changes. Lose weight if you are overweight. Losing just 5-10% of your body weight can help your overall health and prevent diseases such as diabetes and heart disease. Focus on eating a balance of foods, including fruits and vegetables, low-fat or nonfat dairy, lean protein, nuts and legumes, whole grains, and heart-healthy oils and fats. This information is not intended to replace advice given to you by your health care provider. Make sure you discuss any questions you have with your health care provider. Document Revised: 04/01/2021 Document Reviewed: 04/01/2021 Elsevier Patient Education  2024 [employer].

## 2024-02-02 ENCOUNTER — Encounter: Admitting: Nurse Practitioner

## 2024-02-02 DIAGNOSIS — E1169 Type 2 diabetes mellitus with other specified complication: Secondary | ICD-10-CM

## 2024-02-02 DIAGNOSIS — E538 Deficiency of other specified B group vitamins: Secondary | ICD-10-CM

## 2024-02-02 DIAGNOSIS — G2581 Restless legs syndrome: Secondary | ICD-10-CM

## 2024-02-02 DIAGNOSIS — K219 Gastro-esophageal reflux disease without esophagitis: Secondary | ICD-10-CM

## 2024-02-02 DIAGNOSIS — E119 Type 2 diabetes mellitus without complications: Secondary | ICD-10-CM

## 2024-02-02 DIAGNOSIS — F330 Major depressive disorder, recurrent, mild: Secondary | ICD-10-CM

## 2024-02-02 DIAGNOSIS — Z1231 Encounter for screening mammogram for malignant neoplasm of breast: Secondary | ICD-10-CM

## 2024-02-02 DIAGNOSIS — E559 Vitamin D deficiency, unspecified: Secondary | ICD-10-CM

## 2024-02-02 DIAGNOSIS — I152 Hypertension secondary to endocrine disorders: Secondary | ICD-10-CM

## 2024-02-02 DIAGNOSIS — F5101 Primary insomnia: Secondary | ICD-10-CM

## 2024-02-02 DIAGNOSIS — Z0000 Encounter for general adult medical examination without abnormal findings: Secondary | ICD-10-CM

## 2024-02-02 DIAGNOSIS — E669 Obesity, unspecified: Secondary | ICD-10-CM

## 2024-03-12 NOTE — Patient Instructions (Signed)

## 2024-03-14 ENCOUNTER — Encounter: Admitting: Nurse Practitioner

## 2024-03-14 ENCOUNTER — Encounter: Payer: Self-pay | Admitting: Nurse Practitioner

## 2024-03-14 VITALS — BP 142/90 | HR 78 | Temp 98.3°F | Resp 17 | Ht 62.01 in | Wt 212.6 lb

## 2024-03-14 DIAGNOSIS — E1159 Type 2 diabetes mellitus with other circulatory complications: Secondary | ICD-10-CM | POA: Diagnosis not present

## 2024-03-14 DIAGNOSIS — E538 Deficiency of other specified B group vitamins: Secondary | ICD-10-CM

## 2024-03-14 DIAGNOSIS — G2581 Restless legs syndrome: Secondary | ICD-10-CM

## 2024-03-14 DIAGNOSIS — F330 Major depressive disorder, recurrent, mild: Secondary | ICD-10-CM

## 2024-03-14 DIAGNOSIS — E669 Obesity, unspecified: Secondary | ICD-10-CM | POA: Diagnosis not present

## 2024-03-14 DIAGNOSIS — Z0000 Encounter for general adult medical examination without abnormal findings: Secondary | ICD-10-CM | POA: Diagnosis not present

## 2024-03-14 DIAGNOSIS — E119 Type 2 diabetes mellitus without complications: Secondary | ICD-10-CM

## 2024-03-14 DIAGNOSIS — F419 Anxiety disorder, unspecified: Secondary | ICD-10-CM

## 2024-03-14 DIAGNOSIS — K219 Gastro-esophageal reflux disease without esophagitis: Secondary | ICD-10-CM | POA: Diagnosis not present

## 2024-03-14 DIAGNOSIS — Z1231 Encounter for screening mammogram for malignant neoplasm of breast: Secondary | ICD-10-CM

## 2024-03-14 DIAGNOSIS — E1169 Type 2 diabetes mellitus with other specified complication: Secondary | ICD-10-CM | POA: Diagnosis not present

## 2024-03-14 DIAGNOSIS — E559 Vitamin D deficiency, unspecified: Secondary | ICD-10-CM

## 2024-03-14 MED ORDER — BUSPIRONE HCL 15 MG PO TABS: 15.0000 mg | ORAL_TABLET | Freq: Two times a day (BID) | ORAL | 1 refills | Status: AC

## 2024-03-14 MED ORDER — RYBELSUS 7 MG PO TABS: 1.0000 | ORAL_TABLET | Freq: Every day | ORAL | 2 refills | Status: AC

## 2024-03-14 MED ORDER — AMLODIPINE BESYLATE 5 MG PO TABS: 5.0000 mg | ORAL_TABLET | Freq: Every day | ORAL | 3 refills | Status: AC

## 2024-03-14 NOTE — Assessment & Plan Note (Signed)
 Chronic, noted past labs.  Recommend she take 1000 MCG B12 daily OTC supplement.  This may benefit energy and RLS too.

## 2024-03-14 NOTE — Assessment & Plan Note (Signed)
 Chronic, ongoing.  BP remains is above goal today.  Will continue Losartan  100 MG and Atenolol . Start Amlodipine  5 MG daily, educated her on this. She has taken in past and tolerated.  Discussed at length with patient. Recommend she monitor BP at least a few mornings a week at home and document. DASH diet at home. Continue current medication regimen and adjust as needed. Labs today: CBC, TSH, CMP.  Consider Amlodipine  if ongoing elevations next visit.  Urine ALB 80 January 2026, continue ARB for protection.

## 2024-03-14 NOTE — Assessment & Plan Note (Signed)
 Chronic.  Recommend she continue weekly dosing.  Check level today.

## 2024-03-14 NOTE — Assessment & Plan Note (Signed)
 Refer to diabetes with obesity plan of care.

## 2024-03-14 NOTE — Assessment & Plan Note (Signed)
 Chronic, ongoing.  Continue Omeprazole  as needed only, not using daily.  Risks of PPI use were discussed with patient including bone loss, C. Diff diarrhea, pneumonia, infections, CKD, electrolyte abnormalities. Verbalizes understanding and chooses to continue the medication. Mag level annually.

## 2024-03-14 NOTE — Assessment & Plan Note (Signed)
 Chronic, exacerbated.  Denies SI/HI. Continue Celexa  20 MG daily, but increase Buspar  to 15 MG BID due to increased anxiety.  Educated her on medications.  Recommend meditation and relaxation techniques.

## 2024-03-14 NOTE — Assessment & Plan Note (Signed)
 Chronic, ongoing.  A1c 8.5% upward trend from previous. Is missing afternoon dose of Metformin  often.  Urine ALB 80 January 2026, remain with ARB on board for kidney protection.  Continue Metformin  and Glipizide , refills sent in. Trial Rybelsus , sample of 3 MG provided in office. Educated Holly Norman on this. She tolerated and did well with Ozempic  injections in past, but they were too costly.  Will see if can get oral treatment covered.  Does not tolerate Trulicity .  Recommend she check fasting sugars daily and document for visits.   - Eye and foot exam up to date - On ARB and statin therapy - Refuses PCV20 and flu

## 2024-03-14 NOTE — Assessment & Plan Note (Signed)
 Chronic, ongoing.  Continue current medication regimen and adjust as needed. Lipid panel today.

## 2024-03-14 NOTE — Assessment & Plan Note (Signed)
 Refer to depression plan of care.

## 2024-03-14 NOTE — Progress Notes (Signed)
 "  BP (!) 142/90 (BP Location: Left Arm, Patient Position: Sitting, Cuff Size: Normal)   Pulse 78   Temp 98.3 F (36.8 C) (Oral)   Resp 17   Ht 5' 2.01 (1.575 m)   Wt 212 lb 9.6 oz (96.4 kg)   LMP  (LMP Unknown)   BMI 38.88 kg/m    Subjective:    Patient ID: Holly Norman, female    DOB: 07-05-1970, 54 y.o.   MRN: [id number]  HPI: Holly Norman is a 54 y.o. female presenting on 03/14/2024 for comprehensive medical examination. Current medical complaints include:none  She currently lives with: Menopausal Symptoms: no  Lost to follow-up since July 2025.  DIABETES July 2024 A1c was 8%. To be taking Metformin  and Glipizide , is missing doses of afternoon Metformin . GLP1s too expensive in past, Ozempic  worked well but Trulicity  caused a lot of side effects. Occasionally takes medication for reflux. Not consistent with B12 and Vitamin D  supplements. Hypoglycemic episodes:no Polydipsia/polyuria: no Visual disturbance: no Chest pain: no Paresthesias: no Glucose Monitoring: not checking  Accucheck frequency:   Fasting glucose:  Post prandial:  Evening:  Before meals: Taking Insulin?: no  Long acting insulin:  Short acting insulin: Blood Pressure Monitoring: occasionally Retinal Examination: Up to Date -- [facility] Foot Exam: Up to Date Pneumovax: refuses Influenza: refuses Aspirin: no   HYPERTENSION / HYPERLIPIDEMIA To be taking Losartan , Atenolol , Rosuvastatin , Zetia  + Fenofibrate  on daily basis. Satisfied with current treatment? yes Duration of hypertension: chronic BP monitoring frequency: not checking BP range:  BP medication side effects: no Duration of hyperlipidemia: chronic Cholesterol medication side effects: no Cholesterol supplements: none Medication compliance: good compliance Aspirin: no Recent stressors: no Recurrent headaches: no Visual changes: no Palpitations: no Dyspnea: no Chest pain: no Lower extremity edema: no Dizzy/lightheaded: no    DEPRESSION Takes Celexa  and Buspar . Takes Vitamin D  supplement on occasion due to history of low level. Very busy with stressors at present - both daughters are pregnant and mom is in nursing home. Mood status: stable Satisfied with current treatment?: yes Symptom severity: moderate  Duration of current treatment : chronic Side effects: no Medication compliance: good compliance Psychotherapy/counseling: yes in past Previous psychiatric medications: Celexa  Depressed mood: occasional Anxious mood: yes  Anhedonia: no Significant weight loss or gain: no Insomnia: yes hard to stay asleep -- taking Delta 9 gummies which offer benefit - underlying RLS which she reports is stable Fatigue: occasionally Feelings of worthlessness or guilt: no Impaired concentration/indecisiveness: yes Suicidal ideations: no Hopelessness: no Crying spells: no    03/14/2024    3:27 PM 09/14/2023   10:36 AM 11/07/2022    1:30 PM 05/21/2022   10:19 AM 04/24/2022   11:15 AM  Depression screen PHQ 2/9  Decreased Interest 0 0 0 0 0  Down, Depressed, Hopeless 0 0 0 0 0  PHQ - 2 Score 0 0 0 0 0  Altered sleeping 3 3 3 3 3   Tired, decreased energy 0 1 0 1 1  Change in appetite 2 0 0 0 0  Feeling bad or failure about yourself  0 0 0 0 0  Trouble concentrating 2 3 3 3 3   Moving slowly or fidgety/restless 0 0 0 0 0  Suicidal thoughts 0 0 0 0 0  PHQ-9 Score 7 7  6  7  7    Difficult doing work/chores  Very difficult Not difficult at all Somewhat difficult      Data saved with a previous flowsheet row  definition      03/14/2024    3:27 PM 09/14/2023   10:38 AM 11/07/2022    1:30 PM 05/21/2022   10:19 AM  GAD 7 : Generalized Anxiety Score  Nervous, Anxious, on Edge 2 1 1 3   Control/stop worrying 2 3 2 2   Worry too much - different things 2 2 2 2   Trouble relaxing 3 3 3 3   Restless 2 3 3 3   Easily annoyed or irritable 1 1 2 3   Afraid - awful might happen 0 0 0 0  Total GAD 7 Score 12 13 13 16   Anxiety Difficulty   Somewhat difficult Not difficult at all Somewhat difficult        11/15/2021   10:27 AM 05/21/2022   10:19 AM 11/07/2022    1:28 PM 09/14/2023   10:36 AM 03/14/2024    3:27 PM  Fall Risk  Falls in the past year? 0 0 0 0 1  Was there an injury with Fall? 0  0  0  0  0  Fall Risk Category Calculator 0 0 0 0 1  Fall Risk Category (Retired) Low       (RETIRED) Patient Fall Risk Level Low fall risk       Patient at Risk for Falls Due to No Fall Risks No Fall Risks No Fall Risks No Fall Risks No Fall Risks  Fall risk Follow up Falls evaluation completed  Falls evaluation completed Falls evaluation completed Falls evaluation completed Falls evaluation completed     Data saved with a previous flowsheet row definition    Past Medical History:  Past Medical History:  Diagnosis Date   Diabetes mellitus without complication (HCC)    Hyperlipidemia    Hypertension     Surgical History:  Past Surgical History:  Procedure Laterality Date   CARDIAC ELECTROPHYSIOLOGY STUDY AND ABLATION     CESAREAN SECTION     COLONOSCOPY WITH PROPOFOL  N/A 01/15/2023   Procedure: COLONOSCOPY WITH PROPOFOL ;  Surgeon: Therisa Bi, MD;  Location: [facility] ENDOSCOPY;  Service: Gastroenterology;  Laterality: N/A;   rt ankle surgery     TOTAL ABDOMINAL HYSTERECTOMY      Medications:  Current Outpatient Medications on File Prior to Visit  Medication Sig   albuterol  (VENTOLIN  HFA) 108 (90 Base) MCG/ACT inhaler Inhale 2 puffs into the lungs every 6 (six) hours as needed for wheezing or shortness of breath.   atenolol  (TENORMIN ) 50 MG tablet Take 1 tablet (50 mg total) by mouth daily.   Cholecalciferol  1.25 MG (50000 UT) TABS Take 1 tablet by mouth once a week.   citalopram  (CELEXA ) 20 MG tablet Take 1 tablet (20 mg total) by mouth daily.   cyanocobalamin  (VITAMIN B12) 1000 MCG tablet Take 1 tablet (1,000 mcg total) by mouth daily.   cyclobenzaprine  (FLEXERIL ) 10 MG tablet Take 1 tablet (10 mg total) by mouth 3 (three)  times daily as needed for muscle spasms.   ezetimibe  (ZETIA ) 10 MG tablet Take 10 mg by mouth daily.   fenofibrate  (TRICOR ) 145 MG tablet Take 1 tablet (145 mg total) by mouth daily.   fluticasone  (FLONASE ) 50 MCG/ACT nasal spray Place 2 sprays into both nostrils daily.   glipiZIDE  (GLUCOTROL ) 5 MG tablet Take 1 tablet (5 mg total) by mouth 2 (two) times daily before a meal.   losartan  (COZAAR ) 100 MG tablet Take 1 tablet (100 mg total) by mouth daily.   meclizine  (ANTIVERT ) 25 MG tablet Take 1 tablet (25 mg total) by  mouth 3 (three) times daily as needed for dizziness.   metFORMIN  (GLUCOPHAGE ) 1000 MG tablet Take 1 tablet (1,000 mg total) by mouth 2 (two) times daily with a meal.   Omega-3 Fatty Acids (FISH OIL) 1000 MG CAPS Take 1,000 mg by mouth daily.   rosuvastatin  (CRESTOR ) 40 MG tablet Take 40 mg by mouth daily.   No current facility-administered medications on file prior to visit.   Allergies:  Allergies[1]  Social History:  Social History   Socioeconomic History   Marital status: Married    Spouse name: Not on file   Number of children: Not on file   Years of education: Not on file   Highest education level: Not on file  Occupational History   Not on file  Tobacco Use   Smoking status: Never   Smokeless tobacco: Never  Vaping Use   Vaping status: Never Used  Substance and Sexual Activity   Alcohol use: No    Alcohol/week: 0.0 standard drinks of alcohol   Drug use: No   Sexual activity: Yes  Other Topics Concern   Not on file  Social History Narrative   Not on file   Social Drivers of Health   Tobacco Use: Low Risk (03/14/2024)   Patient History    Smoking Tobacco Use: Never    Smokeless Tobacco Use: Never    Passive Exposure: Not on file  Financial Resource Strain: Not on file  Food Insecurity: Not on file  Transportation Needs: Not on file  Physical Activity: Not on file  Stress: Not on file  Social Connections: Not on file  Intimate Partner Violence:  Not on file  Depression (PHQ2-9): Medium Risk (03/14/2024)   Depression (PHQ2-9)    PHQ-2 Score: 7  Alcohol Screen: Not on file  Housing: Not on file  Utilities: Not on file  Health Literacy: Not on file   Tobacco Use History[2] Social History   Substance and Sexual Activity  Alcohol Use No   Alcohol/week: 0.0 standard drinks of alcohol    Family History:  Family History  Problem Relation Age of Onset   Diabetes Mother    Heart disease Mother    Hypertension Mother    Heart disease Father 82   Diabetes Father    Hypertension Father    Stroke Father    Cancer Maternal Grandmother        breast and colon   Breast cancer Maternal Grandmother 65   Heart disease Maternal Grandfather    Heart disease Paternal Grandfather     Past medical history, surgical history, medications, allergies, family history and social history reviewed with patient today and changes made to appropriate areas of the chart.   ROS All other ROS negative except what is listed above and in the HPI.      Objective:    BP (!) 142/90 (BP Location: Left Arm, Patient Position: Sitting, Cuff Size: Normal)   Pulse 78   Temp 98.3 F (36.8 C) (Oral)   Resp 17   Ht 5' 2.01 (1.575 m)   Wt 212 lb 9.6 oz (96.4 kg)   LMP  (LMP Unknown)   BMI 38.88 kg/m   Wt Readings from Last 3 Encounters:  03/14/24 212 lb 9.6 oz (96.4 kg)  09/14/23 204 lb 12.8 oz (92.9 kg)  01/15/23 202 lb (91.6 kg)    Physical Exam Vitals and nursing note reviewed. Exam conducted with a chaperone present.  Constitutional:      General: She is awake. She  is not in acute distress.    Appearance: She is well-developed and well-groomed. She is obese. She is not ill-appearing or toxic-appearing.  HENT:     Head: Normocephalic and atraumatic.     Right Ear: Hearing, tympanic membrane, ear canal and external ear normal. No drainage.     Left Ear: Hearing, tympanic membrane, ear canal and external ear normal. No drainage.     Nose: Nose  normal.     Right Sinus: No maxillary sinus tenderness or frontal sinus tenderness.     Left Sinus: No maxillary sinus tenderness or frontal sinus tenderness.     Mouth/Throat:     Mouth: Mucous membranes are moist.     Pharynx: Oropharynx is clear. Uvula midline. No pharyngeal swelling, oropharyngeal exudate or posterior oropharyngeal erythema.  Eyes:     General: Lids are normal.        Right eye: No discharge.        Left eye: No discharge.     Extraocular Movements: Extraocular movements intact.     Conjunctiva/sclera: Conjunctivae normal.     Pupils: Pupils are equal, round, and reactive to light.     Visual Fields: Right eye visual fields normal and left eye visual fields normal.  Neck:     Thyroid: No thyromegaly.     Vascular: No carotid bruit.     Trachea: Trachea normal.  Cardiovascular:     Rate and Rhythm: Normal rate and regular rhythm.     Heart sounds: Normal heart sounds. No murmur heard.    No gallop.  Pulmonary:     Effort: Pulmonary effort is normal. No accessory muscle usage or respiratory distress.     Breath sounds: Normal breath sounds. No decreased breath sounds, wheezing or rales.  Chest:  Breasts:    Right: Normal.     Left: Normal.  Abdominal:     General: Bowel sounds are normal.     Palpations: Abdomen is soft. There is no hepatomegaly or splenomegaly.     Tenderness: There is no abdominal tenderness.  Musculoskeletal:        General: Normal range of motion.     Cervical back: Normal range of motion and neck supple.     Right lower leg: No edema.     Left lower leg: No edema.  Lymphadenopathy:     Head:     Right side of head: No submental, submandibular, tonsillar, preauricular or posterior auricular adenopathy.     Left side of head: No submental, submandibular, tonsillar, preauricular or posterior auricular adenopathy.     Cervical: No cervical adenopathy.     Upper Body:     Right upper body: No supraclavicular, axillary or pectoral  adenopathy.     Left upper body: No supraclavicular, axillary or pectoral adenopathy.  Skin:    General: Skin is warm and dry.     Capillary Refill: Capillary refill takes less than 2 seconds.     Findings: No rash.  Neurological:     Mental Status: She is alert and oriented to person, place, and time.     Gait: Gait is intact.     Deep Tendon Reflexes: Reflexes are normal and symmetric.     Reflex Scores:      Brachioradialis reflexes are 2+ on the right side and 2+ on the left side.      Patellar reflexes are 2+ on the right side and 2+ on the left side. Psychiatric:  Attention and Perception: Attention normal.        Mood and Affect: Mood normal.        Speech: Speech normal.        Behavior: Behavior normal. Behavior is cooperative.        Thought Content: Thought content normal.        Judgment: Judgment normal.     Results for orders placed or performed in visit on 09/14/23  Bayer DCA Hb A1c Waived   Collection Time: 09/14/23 10:37 AM  Result Value Ref Range   HB A1C (BAYER DCA - WAIVED) 8.0 (H) 4.8 - 5.6 %  Microalbumin, Urine Waived   Collection Time: 09/14/23 10:37 AM  Result Value Ref Range   Microalb, Ur Waived 30 (H) 0 - 19 mg/L   Creatinine, Urine Waived 50 10 - 300 mg/dL   Microalb/Creat Ratio >300 (H) <30 mg/g  CBC with Differential/Platelet   Collection Time: 09/14/23 10:40 AM  Result Value Ref Range   WBC 5.1 3.4 - 10.8 x10E3/uL   RBC 4.70 3.77 - 5.28 x10E6/uL   Hemoglobin 14.9 11.1 - 15.9 g/dL   Hematocrit 54.9 65.9 - 46.6 %   MCV 96 79 - 97 fL   MCH 31.7 26.6 - 33.0 pg   MCHC 33.1 31.5 - 35.7 g/dL   RDW 87.4 88.2 - 84.5 %   Platelets 225 150 - 450 x10E3/uL   Neutrophils 60 Not Estab. %   Lymphs 29 Not Estab. %   Monocytes 7 Not Estab. %   Eos 3 Not Estab. %   Basos 1 Not Estab. %   Neutrophils Absolute 3.1 1.4 - 7.0 x10E3/uL   Lymphocytes Absolute 1.5 0.7 - 3.1 x10E3/uL   Monocytes Absolute 0.3 0.1 - 0.9 x10E3/uL   EOS (ABSOLUTE) 0.2 0.0  - 0.4 x10E3/uL   Basophils Absolute 0.1 0.0 - 0.2 x10E3/uL   Immature Granulocytes 0 Not Estab. %   Immature Grans (Abs) 0.0 0.0 - 0.1 x10E3/uL  Comprehensive metabolic panel with GFR   Collection Time: 09/14/23 10:40 AM  Result Value Ref Range   Glucose 151 (H) 70 - 99 mg/dL   BUN 12 6 - 24 mg/dL   Creatinine, Ser 9.18 0.57 - 1.00 mg/dL   eGFR 87 >40 fO/fpw/8.26   BUN/Creatinine Ratio 15 9 - 23   Sodium 139 134 - 144 mmol/L   Potassium 4.9 3.5 - 5.2 mmol/L   Chloride 103 96 - 106 mmol/L   CO2 23 20 - 29 mmol/L   Calcium  9.6 8.7 - 10.2 mg/dL   Total Protein 7.1 6.0 - 8.5 g/dL   Albumin 4.4 3.8 - 4.9 g/dL   Globulin, Total 2.7 1.5 - 4.5 g/dL   Bilirubin Total 0.7 0.0 - 1.2 mg/dL   Alkaline Phosphatase 76 44 - 121 IU/L   AST 21 0 - 40 IU/L   ALT 21 0 - 32 IU/L  Lipid Panel w/o Chol/HDL Ratio   Collection Time: 09/14/23 10:40 AM  Result Value Ref Range   Cholesterol, Total 304 (H) 100 - 199 mg/dL   Triglycerides 685 (H) 0 - 149 mg/dL   HDL 46 >60 mg/dL   VLDL Cholesterol Cal 63 (H) 5 - 40 mg/dL   LDL Chol Calc (NIH) 804 (H) 0 - 99 mg/dL   LDL CALC COMMENT: Comment   TSH   Collection Time: 09/14/23 10:40 AM  Result Value Ref Range   TSH 2.140 0.450 - 4.500 uIU/mL  VITAMIN D  25 Hydroxy (Vit-D Deficiency,  Fractures)   Collection Time: 09/14/23 10:40 AM  Result Value Ref Range   Vit D, 25-Hydroxy 23.3 (L) 30.0 - 100.0 ng/mL  Vitamin B12   Collection Time: 09/14/23 10:40 AM  Result Value Ref Range   Vitamin B-12 209 (L) 232 - 1,245 pg/mL  Iron   Collection Time: 09/14/23 10:40 AM  Result Value Ref Range   Iron 82 27 - 159 ug/dL  Ferritin   Collection Time: 09/14/23 10:40 AM  Result Value Ref Range   Ferritin 56 15 - 150 ng/mL  Magnesium   Collection Time: 09/14/23 10:40 AM  Result Value Ref Range   Magnesium 1.9 1.6 - 2.3 mg/dL      Assessment & Plan:   Problem List Items Addressed This Visit       Cardiovascular and Mediastinum   Hypertension associated with  diabetes (HCC)   Chronic, ongoing.  BP remains is above goal today.  Will continue Losartan  100 MG and Atenolol . Start Amlodipine  5 MG daily, educated her on this. She has taken in past and tolerated.  Discussed at length with patient. Recommend she monitor BP at least a few mornings a week at home and document. DASH diet at home. Continue current medication regimen and adjust as needed. Labs today: CBC, TSH, CMP.  Consider Amlodipine  if ongoing elevations next visit.  Urine ALB 80 January 2026, continue ARB for protection.         Relevant Medications   Semaglutide  (RYBELSUS ) 7 MG TABS (Start on 04/12/2024)   amLODipine  (NORVASC ) 5 MG tablet   Other Relevant Orders   Bayer DCA Hb A1c Waived   Microalbumin, Urine Waived   CBC with Differential/Platelet   Comprehensive metabolic panel with GFR   TSH     Digestive   GERD (gastroesophageal reflux disease)   Chronic, ongoing.  Continue Omeprazole  as needed only, not using daily.  Risks of PPI use were discussed with patient including bone loss, C. Diff diarrhea, pneumonia, infections, CKD, electrolyte abnormalities. Verbalizes understanding and chooses to continue the medication. Mag level annually.       Relevant Orders   Magnesium     Endocrine   Type 2 diabetes mellitus in patient with obesity (HCC) - Primary   Chronic, ongoing.  A1c 8.5% upward trend from previous. Is missing afternoon dose of Metformin  often.  Urine ALB 80 January 2026, remain with ARB on board for kidney protection.  Continue Metformin  and Glipizide , refills sent in. Trial Rybelsus , sample of 3 MG provided in office. Educated her on this. She tolerated and did well with Ozempic  injections in past, but they were too costly.  Will see if can get oral treatment covered.  Does not tolerate Trulicity .  Recommend she check fasting sugars daily and document for visits.   - Eye and foot exam up to date - On ARB and statin therapy - Refuses PCV20 and flu      Relevant  Medications   Semaglutide  (RYBELSUS ) 7 MG TABS (Start on 04/12/2024)   Other Relevant Orders   Bayer DCA Hb A1c Waived   Hyperlipidemia associated with type 2 diabetes mellitus (HCC)   Chronic, ongoing.  Continue current medication regimen and adjust as needed.  Lipid panel today.      Relevant Medications   Semaglutide  (RYBELSUS ) 7 MG TABS (Start on 04/12/2024)   amLODipine  (NORVASC ) 5 MG tablet   Other Relevant Orders   Bayer DCA Hb A1c Waived   Comprehensive metabolic panel with GFR   Lipid Panel  w/o Chol/HDL Ratio   Diabetes mellitus treated with oral medication (HCC)   Refer to diabetes with obesity plan of care.      Relevant Medications   Semaglutide  (RYBELSUS ) 7 MG TABS (Start on 04/12/2024)   Other Relevant Orders   Bayer DCA Hb A1c Waived     Other   Vitamin D  deficiency   Chronic.  Recommend she continue weekly dosing.  Check level today.      Relevant Orders   VITAMIN D  25 Hydroxy (Vit-D Deficiency, Fractures)   Vitamin B12 deficiency   Chronic, noted past labs.  Recommend she take 1000 MCG B12 daily OTC supplement.  This may benefit energy and RLS too.        Relevant Orders   Vitamin B12   Restless legs   Chronic, stable without medication at this time.  Monitor and start treatment as needed.  Labs today.      Relevant Orders   Ferritin   Iron   Depression   Chronic, exacerbated.  Denies SI/HI. Continue Celexa  20 MG daily, but increase Buspar  to 15 MG BID due to increased anxiety.  Educated her on medications.  Recommend meditation and relaxation techniques.        Relevant Medications   busPIRone  (BUSPAR ) 15 MG tablet   Anxiety   Refer to depression plan of care.      Relevant Medications   busPIRone  (BUSPAR ) 15 MG tablet   Other Visit Diagnoses       Encounter for screening mammogram for malignant neoplasm of breast       Mammogram ordered and instructed how to schedule.   Relevant Orders   MM 3D SCREENING MAMMOGRAM BILATERAL BREAST      Encounter for annual physical exam       Annual physical today with labs and health maintenance reviewed, discussed with patient.        Follow up plan: Return in about 5 weeks (around 04/18/2024) for T2DM, HTN, MOOD -- medication changes on 03/14/24.   LABORATORY TESTING:  - Pap smear: not applicable -- only has ovaries  IMMUNIZATIONS:   - Tdap: Tetanus vaccination status reviewed: last tetanus booster within 10 years. - Influenza: Refused - Pneumovax: Refused - Prevnar: Refused - COVID: Refused - HPV: Not applicable - Shingrix vaccine: Refused  SCREENING: -Mammogram: Ordered today  - Colonoscopy: Up to date  - Bone Density: Not applicable  -Hearing Test: Not applicable  -Spirometry: Not applicable   PATIENT COUNSELING:   Advised to take 1 mg of folate supplement per day if capable of pregnancy.   Sexuality: Discussed sexually transmitted diseases, partner selection, use of condoms, avoidance of unintended pregnancy  and contraceptive alternatives.   Advised to avoid cigarette smoking.  I discussed with the patient that most people either abstain from alcohol or drink within safe limits (<=14/week and <=4 drinks/occasion for males, <=7/weeks and <= 3 drinks/occasion for females) and that the risk for alcohol disorders and other health effects rises proportionally with the number of drinks per week and how often a drinker exceeds daily limits.  Discussed cessation/primary prevention of drug use and availability of treatment for abuse.   Diet: Encouraged to adjust caloric intake to maintain  or achieve ideal body weight, to reduce intake of dietary saturated fat and total fat, to limit sodium intake by avoiding high sodium foods and not adding table salt, and to maintain adequate dietary potassium and calcium  preferably from fresh fruits, vegetables, and low-fat dairy products.  Stressed the importance of regular exercise  Injury prevention: Discussed safety belts, safety  helmets, smoke detector, smoking near bedding or upholstery.   Dental health: Discussed importance of regular tooth brushing, flossing, and dental visits.    NEXT PREVENTATIVE PHYSICAL DUE IN 1 YEAR. Return in about 5 weeks (around 04/18/2024) for T2DM, HTN, MOOD -- medication changes on 03/14/24.              [1]  Allergies Allergen Reactions   Ivp Dye [Iodinated Contrast Media] Shortness Of Breath   Trulicity  [Dulaglutide ] Nausea And Vomiting   Tape Rash  [2]  Social History Tobacco Use  Smoking Status Never  Smokeless Tobacco Never   "

## 2024-03-14 NOTE — Assessment & Plan Note (Signed)
 Chronic, stable without medication at this time.  Monitor and start treatment as needed.  Labs today.

## 2024-03-15 ENCOUNTER — Ambulatory Visit: Payer: Self-pay | Admitting: Nurse Practitioner

## 2024-03-15 LAB — MICROALBUMIN, URINE WAIVED
Creatinine, Urine Waived: 100 mg/dL (ref 10–300)
Microalb, Ur Waived: 80 mg/L — ABNORMAL HIGH (ref 0–19)

## 2024-03-15 LAB — BAYER DCA HB A1C WAIVED: HB A1C (BAYER DCA - WAIVED): 8.5 % — ABNORMAL HIGH (ref 4.8–5.6)

## 2024-03-15 NOTE — Progress Notes (Signed)
 Contacted via MyChart  Good morning Holly Norman, your labs have returned: - Kidney function, creatinine and eGFR, remains normal, as is liver function, AST and ALT.  - Lipid panel shows LDL at goal, continue statin therapy daily to maintain stable levels. - B12 level is low, please take Vitamin B12 1000 MCG daily from over the counter. This is good for overall nervous system health and can help with fatigue. - Remainder of labs stable, waiting on thyroid result and if abnormal will let you know. Any questions? Keep being amazing!!  Thank you for allowing me to participate in your care.  I appreciate you. Kindest regards, Ivelis Norgard

## 2024-03-17 LAB — LIPID PANEL W/O CHOL/HDL RATIO
Cholesterol, Total: 160 mg/dL (ref 100–199)
HDL: 46 mg/dL
LDL Chol Calc (NIH): 54 mg/dL (ref 0–99)
Triglycerides: 401 mg/dL — ABNORMAL HIGH (ref 0–149)
VLDL Cholesterol Cal: 60 mg/dL — ABNORMAL HIGH (ref 5–40)

## 2024-03-17 LAB — CBC WITH DIFFERENTIAL/PLATELET
Basophils Absolute: 0 x10E3/uL (ref 0.0–0.2)
Basos: 0 %
EOS (ABSOLUTE): 0.3 x10E3/uL (ref 0.0–0.4)
Eos: 4 %
Hematocrit: 41.2 % (ref 34.0–46.6)
Hemoglobin: 13.8 g/dL (ref 11.1–15.9)
Immature Grans (Abs): 0 x10E3/uL (ref 0.0–0.1)
Immature Granulocytes: 0 %
Lymphocytes Absolute: 1.8 x10E3/uL (ref 0.7–3.1)
Lymphs: 26 %
MCH: 31.8 pg (ref 26.6–33.0)
MCHC: 33.5 g/dL (ref 31.5–35.7)
MCV: 95 fL (ref 79–97)
Monocytes Absolute: 0.4 x10E3/uL (ref 0.1–0.9)
Monocytes: 5 %
Neutrophils Absolute: 4.6 x10E3/uL (ref 1.4–7.0)
Neutrophils: 65 %
Platelets: 202 x10E3/uL (ref 150–450)
RBC: 4.34 x10E6/uL (ref 3.77–5.28)
RDW: 12.8 % (ref 11.7–15.4)
WBC: 7.1 x10E3/uL (ref 3.4–10.8)

## 2024-03-17 LAB — COMPREHENSIVE METABOLIC PANEL WITH GFR
ALT: 17 IU/L (ref 0–32)
AST: 18 IU/L (ref 0–40)
Albumin: 4.3 g/dL (ref 3.8–4.9)
Alkaline Phosphatase: 72 IU/L (ref 49–135)
BUN/Creatinine Ratio: 18 (ref 9–23)
BUN: 15 mg/dL (ref 6–24)
Bilirubin Total: 1.2 mg/dL (ref 0.0–1.2)
CO2: 24 mmol/L (ref 20–29)
Calcium: 10 mg/dL (ref 8.7–10.2)
Chloride: 103 mmol/L (ref 96–106)
Creatinine, Ser: 0.84 mg/dL (ref 0.57–1.00)
Globulin, Total: 2.5 g/dL (ref 1.5–4.5)
Glucose: 126 mg/dL — ABNORMAL HIGH (ref 70–99)
Potassium: 5.2 mmol/L (ref 3.5–5.2)
Sodium: 139 mmol/L (ref 134–144)
Total Protein: 6.8 g/dL (ref 6.0–8.5)
eGFR: 83 mL/min/1.73

## 2024-03-17 LAB — FERRITIN: Ferritin: 53 ng/mL (ref 15–150)

## 2024-03-17 LAB — TSH: TSH: 1.25 u[IU]/mL (ref 0.450–4.500)

## 2024-03-17 LAB — VITAMIN B12: Vitamin B-12: 219 pg/mL — ABNORMAL LOW (ref 232–1245)

## 2024-03-17 LAB — IRON: Iron: 89 ug/dL (ref 27–159)

## 2024-03-17 LAB — MAGNESIUM: Magnesium: 1.9 mg/dL (ref 1.6–2.3)

## 2024-03-17 LAB — VITAMIN D 25 HYDROXY (VIT D DEFICIENCY, FRACTURES): Vit D, 25-Hydroxy: 32.8 ng/mL (ref 30.0–100.0)

## 2024-03-18 ENCOUNTER — Telehealth: Payer: Self-pay

## 2024-03-18 NOTE — Progress Notes (Signed)
 Care Guide Pharmacy Note  03/18/2024 Name: JABREA KALLSTROM MRN: [id number] DOB: Jul 26, 1970  Referred By: Valerio Melanie DASEN, NP Reason for referral: Complex Care Management (Outreach to schedule with Pharm d )   GIZELLE WHETSEL is a 54 y.o. year old female who is a primary care patient of Cannady, Jolene T, NP.  Jon LELON Molt was referred to the pharmacist for assistance related to: DMII  An unsuccessful telephone outreach was attempted today to contact the patient who was referred to the pharmacy team for assistance with medication assistance. Additional attempts will be made to contact the patient.  Jeoffrey Buffalo , RMA     [facility] [place], [facility] Guide  Direct Dial: [phone]  Website: [email or web address]

## 2024-03-25 NOTE — Progress Notes (Signed)
 Care Guide Pharmacy Note  03/25/2024 Name: [name] MRN: [id number] DOB: 1970-08-20  Referred By: Valerio Melanie DASEN, NP Reason for referral: Complex Care Management (Outreach to schedule with Pharm d )   Holly Norman is a 54 y.o. year old female who is a primary care patient of Cannady, Jolene T, NP.  Jon LELON Molt was referred to the pharmacist for assistance related to: DMII  A third unsuccessful telephone outreach was attempted today to contact the patient who was referred to the pharmacy team for assistance with medication assistance. The Population Health team is pleased to engage with this patient at any time in the future upon receipt of referral and should he/she be interested in assistance from the [employer] [facility] team.  Jeoffrey Buffalo , RMA     [facility]  [facility] Guide  Direct Dial: [phone]  Website: [email or web address]

## 2024-04-19 ENCOUNTER — Ambulatory Visit: Admitting: Nurse Practitioner
